# Patient Record
Sex: Male | Born: 1965
Health system: Southern US, Community
[De-identification: ages and names within clinical notes are randomized; demographics above are authoritative.]

## PROBLEM LIST (undated history)

## (undated) DIAGNOSIS — I5020 Unspecified systolic (congestive) heart failure: Secondary | ICD-10-CM

## (undated) DIAGNOSIS — E119 Type 2 diabetes mellitus without complications: Secondary | ICD-10-CM

## (undated) DIAGNOSIS — E559 Vitamin D deficiency, unspecified: Secondary | ICD-10-CM

## (undated) DIAGNOSIS — I4819 Other persistent atrial fibrillation: Secondary | ICD-10-CM

## (undated) DIAGNOSIS — N433 Hydrocele, unspecified: Secondary | ICD-10-CM

## (undated) DIAGNOSIS — K635 Polyp of colon: Secondary | ICD-10-CM

## (undated) DIAGNOSIS — I77810 Thoracic aortic ectasia: Secondary | ICD-10-CM

## (undated) DIAGNOSIS — M1990 Unspecified osteoarthritis, unspecified site: Secondary | ICD-10-CM

## (undated) DIAGNOSIS — K219 Gastro-esophageal reflux disease without esophagitis: Secondary | ICD-10-CM

## (undated) DIAGNOSIS — G4730 Sleep apnea, unspecified: Secondary | ICD-10-CM

## (undated) DIAGNOSIS — I429 Cardiomyopathy, unspecified: Secondary | ICD-10-CM

## (undated) DIAGNOSIS — T7840XA Allergy, unspecified, initial encounter: Secondary | ICD-10-CM

## (undated) HISTORY — DX: Allergy, unspecified, initial encounter: T78.40XA

## (undated) HISTORY — PX: APPENDECTOMY: SHX54

## (undated) HISTORY — PX: COLONOSCOPY WITH PROPOFOL: SHX5780

## (undated) HISTORY — DX: Sleep apnea, unspecified: G47.30

## (undated) HISTORY — DX: Unspecified osteoarthritis, unspecified site: M19.90

---

## 2005-03-24 ENCOUNTER — Observation Stay: Payer: Self-pay | Admitting: Internal Medicine

## 2007-08-17 ENCOUNTER — Emergency Department: Payer: Self-pay

## 2007-10-26 IMAGING — US ABDOMEN ULTRASOUND
1 series · 17 of 25 positions shown · non-contrast
Comparison: none

REASON FOR EXAM: Abdominal pain
COMMENTS:

[Series 1: abdomen ultrasound · 17 of 71 slices shown]
[im 1/71]
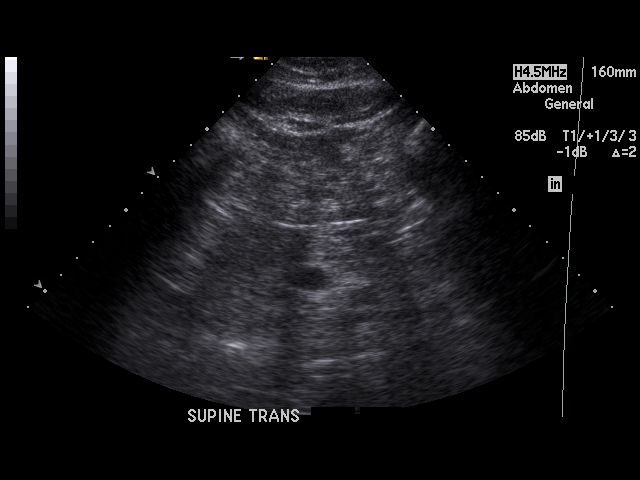
[im 6/71]
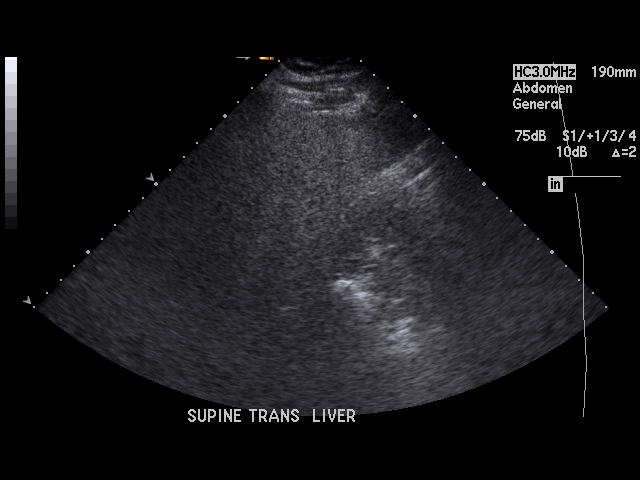
[im 9/71]
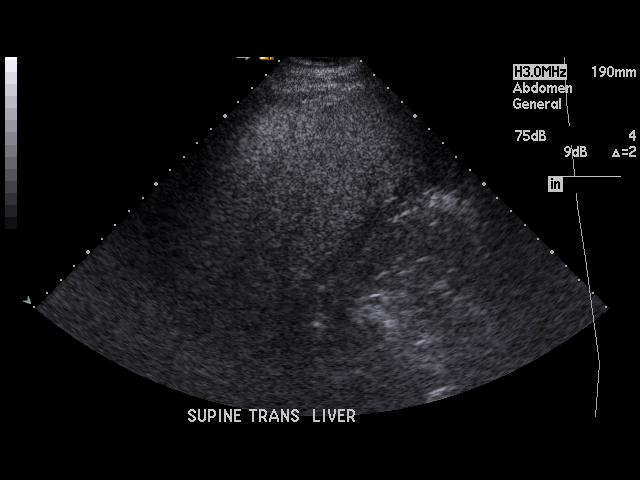
[im 15/71]
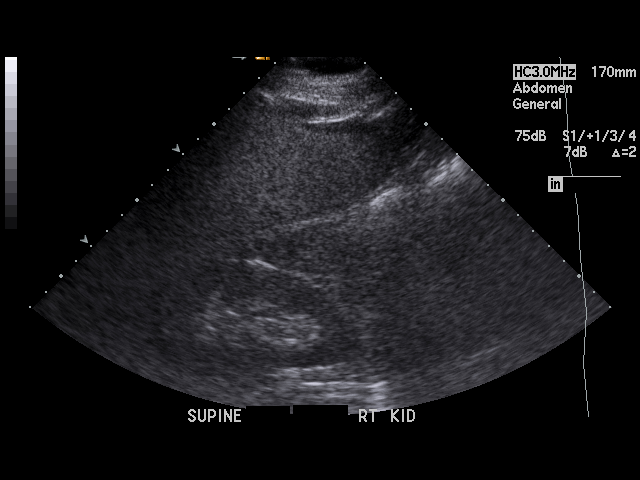
[im 18/71]
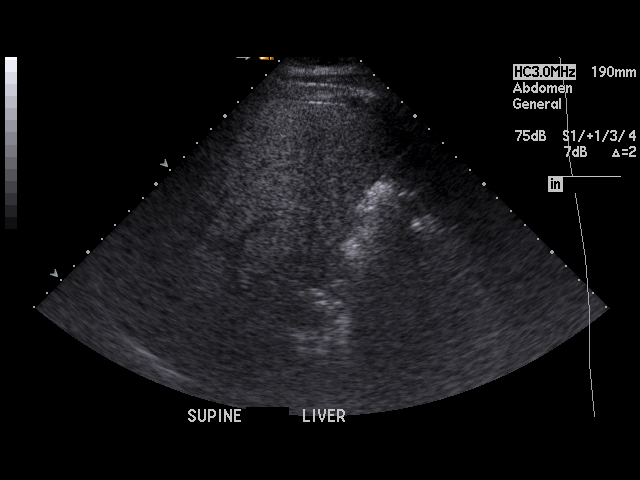
[im 24/71]
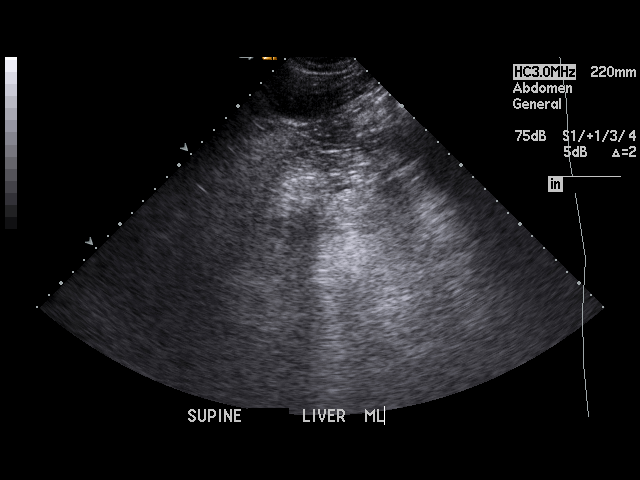
[im 27/71]
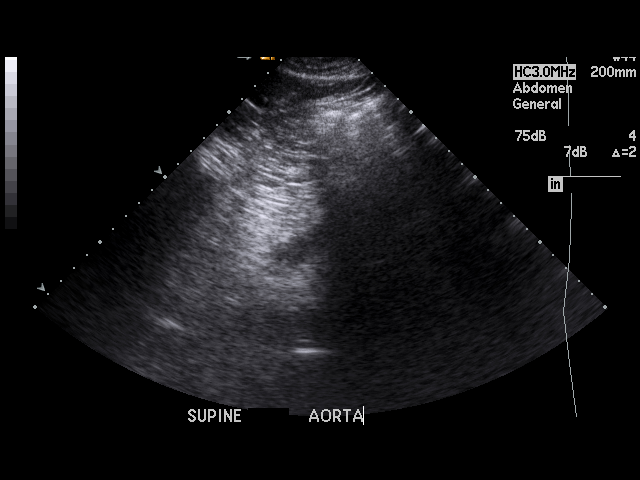
[im 33/71]
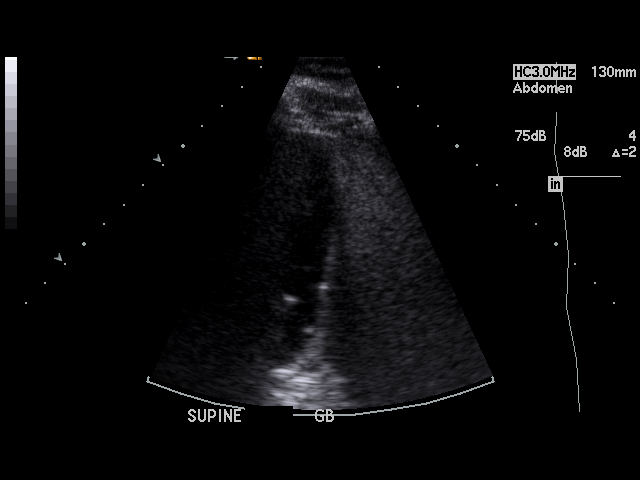
[im 36/71]
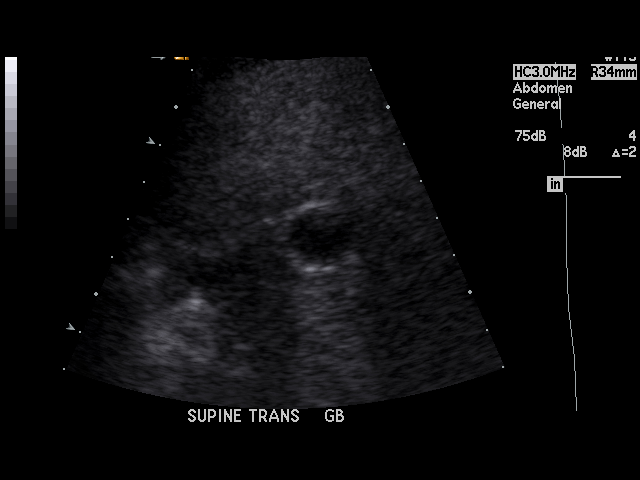
[im 38/71]
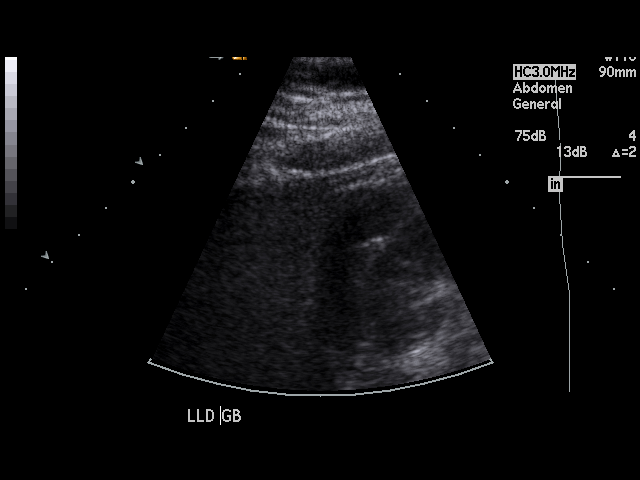
[im 44/71]
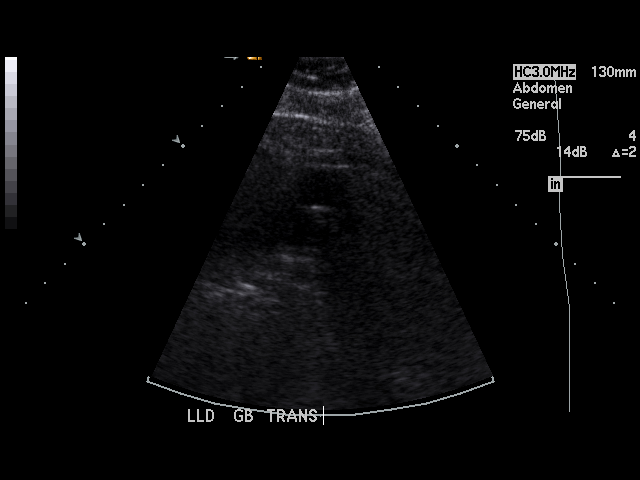
[im 47/71]
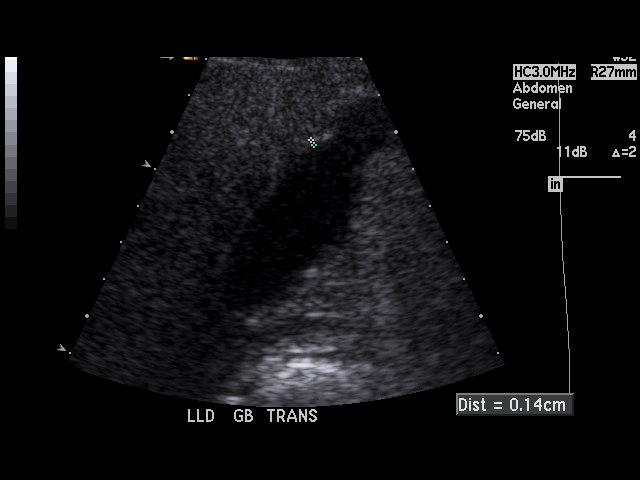
[im 53/71]
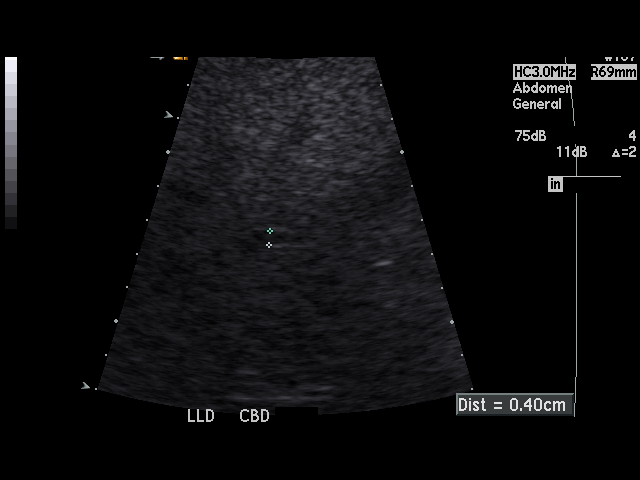
[im 56/71]
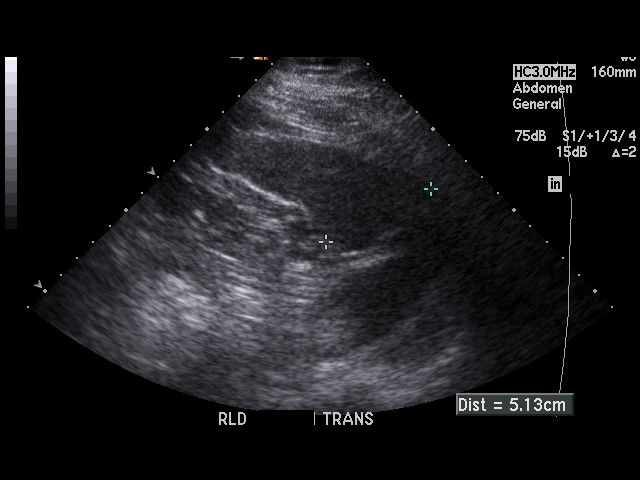
[im 62/71]
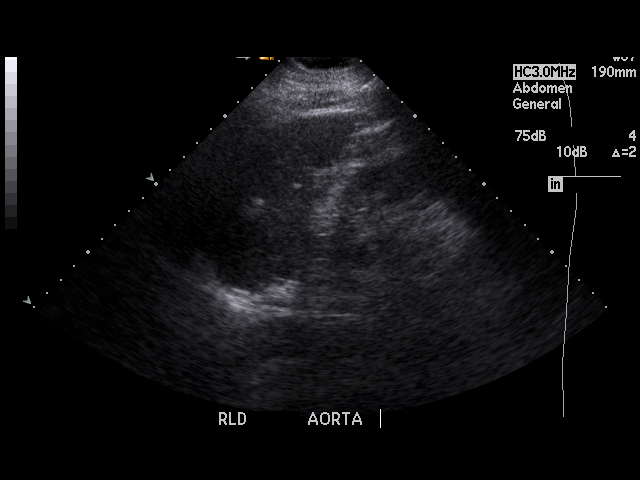
[im 65/71]
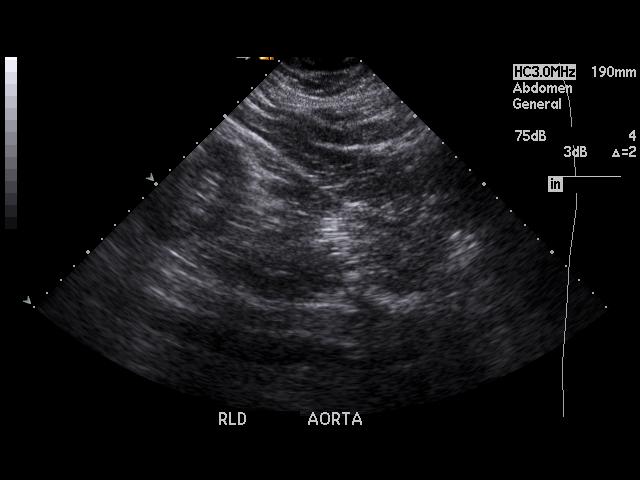
[im 71/71]
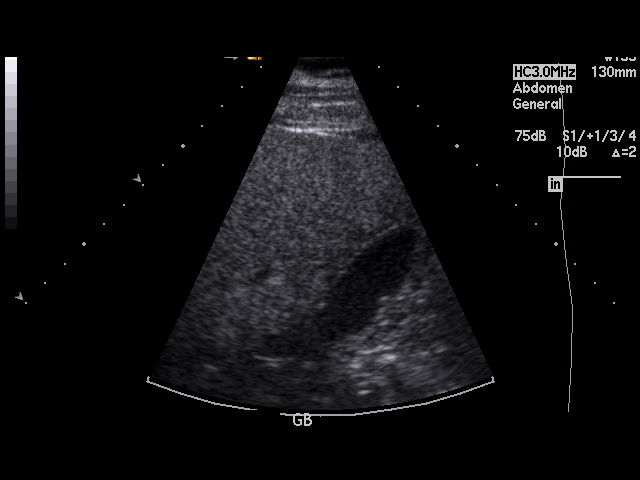

[17 of 25 positions shown; findings below may reference images not displayed]

PROCEDURE:     US  - US ABDOMEN GENERAL SURVEY  - March 24, 2005 [DATE]

RESULT:          Sonographic evaluation of the abdomen demonstrates a very
dense echogenic liver.  No gross abnormality is seen.  Portal venous flow is
normal.  The gallbladder contains some echogenic structures which do not
definitely shadow.  These do not change position with the patient being
placed from a supine to LEFT lateral decubitus to erect position.  There is
no sonographic Murphy's sign.  The gallbladder wall thickness is normal at 2
mm, and the common bile duct diameter is normal at 4 mm.  The spleen is
normal.  The kidneys are unremarkable.  The aorta appears to be grossly
normal.
IMPRESSION: 1.     Findings suggestive of gallbladder polyps versus adherent stones.  No
evidence of cholecystitis.  Continued clinical and laboratory followup would
be recommended.
2.     Dense liver which could represent fatty infiltration or chronic
hepatocellular disease.

## 2017-04-26 HISTORY — PX: COLONOSCOPY WITH PROPOFOL: SHX5780

## 2017-05-08 ENCOUNTER — Encounter: Payer: Self-pay | Admitting: Emergency Medicine

## 2017-05-08 ENCOUNTER — Emergency Department
Admission: EM | Admit: 2017-05-08 | Discharge: 2017-05-08 | Disposition: A | Discharge status: Home / Self Care | Payer: BLUE CROSS/BLUE SHIELD | Attending: Emergency Medicine | Admitting: Emergency Medicine

## 2017-05-08 ENCOUNTER — Emergency Department: Payer: BLUE CROSS/BLUE SHIELD

## 2017-05-08 ENCOUNTER — Other Ambulatory Visit: Payer: Self-pay

## 2017-05-08 DIAGNOSIS — R079 Chest pain, unspecified: Secondary | ICD-10-CM | POA: Insufficient documentation

## 2017-05-08 DIAGNOSIS — I10 Essential (primary) hypertension: Secondary | ICD-10-CM | POA: Insufficient documentation

## 2017-05-08 LAB — BASIC METABOLIC PANEL
Anion gap: 6 (ref 5–15)
BUN: 16 mg/dL (ref 6–20)
CALCIUM: 9.1 mg/dL (ref 8.9–10.3)
CO2: 28 mmol/L (ref 22–32)
CREATININE: 1.3 mg/dL — AB (ref 0.61–1.24)
Chloride: 104 mmol/L (ref 101–111)
Glucose, Bld: 106 mg/dL — ABNORMAL HIGH (ref 65–99)
Potassium: 4.2 mmol/L (ref 3.5–5.1)
SODIUM: 138 mmol/L (ref 135–145)

## 2017-05-08 LAB — TROPONIN I: Troponin I: <0.03 ng/mL (ref <0.03)

## 2017-05-08 LAB — CBC
HCT: 44.6 % (ref 40.0–52.0)
Hemoglobin: 15.3 g/dL (ref 13.0–18.0)
MCH: 30.6 pg (ref 26.0–34.0)
MCHC: 34.3 g/dL (ref 32.0–36.0)
MCV: 89.1 fL (ref 80.0–100.0)
Platelets: 313 10*3/uL (ref 150–440)
RBC: 5 MIL/uL (ref 4.40–5.90)
RDW: 13.4 % (ref 11.5–14.5)
WBC: 8.6 10*3/uL (ref 3.8–10.6)

## 2017-05-08 MED ORDER — FAMOTIDINE 20 MG PO TABS: 20.0000 mg | ORAL_TABLET | Freq: Two times a day (BID) | ORAL | 0 refills | Status: DC

## 2017-05-08 MED ORDER — GI COCKTAIL ~~LOC~~
30.0000 mL | Freq: Once | ORAL | Status: AC
Start: 2017-05-08 — End: 2017-05-08
  Administered 2017-05-08: 30 mL via ORAL

## 2017-05-08 NOTE — ED Provider Notes (Signed)
[facility] Emergency Department Provider Note   First MD Initiated Contact with Patient 05/08/17 [phone]     (approximate)  I have reviewed the triage vital signs and the nursing notes.   HISTORY  Chief Complaint Chest Pain    HPI Casey Velez is a 52 y.o. male with history of hypertension presents to the emergency department with acute onset 2:30 AM this morning of mid chest pain tightness that is nonradiating.  Patient states that he thought this may have been indigestion and as such he took an antacid as well as during 25 mg of aspirin without any relief.  Patient denies any dyspnea no lower extreme knee pain or swelling.  Patient denies any personal history of CAD DVT or pulmonary emboli.  Patient states that his father had a heart attack however no additional family history of CAD.   Past medical history Hypertension There are no active problems to display for this patient.   Past Surgical History:  Procedure Laterality Date  . APPENDECTOMY      Prior to Admission medications   Not on File    Allergies No known drug allergies No family history on file.  Social History Social History   Tobacco Use  . Smoking status: Never Smoker  . Smokeless tobacco: Never Used  Substance Use Topics  . Alcohol use: Not on file  . Drug use: Not on file    Review of Systems Constitutional: No fever/chills Eyes: No visual changes. ENT: No sore throat. Cardiovascular: Positive for chest pain. Respiratory: Denies shortness of breath. Gastrointestinal: No abdominal pain.  No nausea, no vomiting.  No diarrhea.  No constipation. Genitourinary: Negative for dysuria. Musculoskeletal: Negative for neck pain.  Negative for back pain. Integumentary: Negative for rash. Neurological: Negative for headaches, focal weakness or numbness.   ____________________________________________   PHYSICAL EXAM:  VITAL SIGNS: ED Triage Vitals  Enc Vitals Group   BP 05/08/17 0405 (!) 168/95     Pulse Rate 05/08/17 0405 65     Resp 05/08/17 0405 20     Temp 05/08/17 0405 97.9 F (36.6 C)     Temp Source 05/08/17 0405 Oral     SpO2 05/08/17 0405 97 %     Weight 05/08/17 0404 132.5 kg (292 lb)     Height 05/08/17 0404 1.676 m (5\' 6" )     Head Circumference --      Peak Flow --      Pain Score 05/08/17 0403 6     Pain Loc --      Pain Edu? --      Excl. in [place]? --      Constitutional: Alert and oriented. Well appearing and in no acute distress. Eyes: Conjunctivae are normal.  Head: Atraumatic. Mouth/Throat: Mucous membranes are moist.  Oropharynx non-erythematous. Neck: No stridor.   Cardiovascular: Normal rate, regular rhythm. Good peripheral circulation. Grossly normal heart sounds. Respiratory: Normal respiratory effort.  No retractions. Lungs CTAB. Gastrointestinal: Soft and nontender. No distention.  Musculoskeletal: No lower extremity tenderness nor edema. No gross deformities of extremities. Neurologic:  Normal speech and language. No gross focal neurologic deficits are appreciated.  Skin:  Skin is warm, dry and intact. No rash noted. Psychiatric: Mood and affect are normal. Speech and behavior are normal.  ____________________________________________   LABS (all labs ordered are listed, but only abnormal results are displayed)  Labs Reviewed  BASIC METABOLIC PANEL - Abnormal; Notable for the following components:  Result Value   Glucose, Bld 106 (*)    Creatinine, Ser 1.30 (*)    All other components within normal limits  CBC  TROPONIN I   ____________________________________________  EKG  ED ECG REPORT I, Garza N Ranon Coven, the attending physician, personally viewed and interpreted this ECG.   Date: 05/08/2017  EKG Time: 4:08 AM  Rate: 63  Rhythm: Normal sinus rhythm  Axis: Left axis deviation  Intervals: Normal  ST&T Change: None  ____________________________________________  RADIOLOGY I, [place] N  Verena Shawgo, personally viewed and evaluated these images (plain radiographs) as part of my medical decision making, as well as reviewing the written report by the radiologist.  ED MD interpretation: No active cardiopulmonary disease per radiologist.  Official radiology report(s): Dg Chest 2 View  Result Date: 05/08/2017 CLINICAL DATA:  Chest pain EXAM: CHEST - 2 VIEW COMPARISON:  None. FINDINGS: The heart size and mediastinal contours are within normal limits. Both lungs are clear. The visualized skeletal structures are unremarkable. IMPRESSION: No active cardiopulmonary disease. Electronically Signed   By: Ulyses Jarred M.D.   On: 05/08/2017 04:32     Procedures   ____________________________________________   INITIAL IMPRESSION / ASSESSMENT AND PLAN / ED COURSE  As part of my medical decision making, I reviewed the following data within the electronic MEDICAL RECORD NUMBER   52 year old male presented with above-stated history and physical exam nonradiating chest tightness.  Consider to possibly of CAD as such EKG was performed which revealed no evidence of ST segment elevation or depression.  Laboratory data revealed a normal troponin.  Plan to obtain a repeat troponin.  Consider possibility of PE and a low probability patient  And as such d-dimer will be obtained.  Patient given a GI cocktail in the emergency department with resolution of symptoms.  Patient's care transferred to Dr. Mable Paris pending repeat troponin with plan to discharge if negative ____________________________________________  FINAL CLINICAL IMPRESSION(S) / ED DIAGNOSES  Final diagnoses:  Chest pain, unspecified type     MEDICATIONS GIVEN DURING THIS VISIT:  Medications  gi cocktail (Maalox,Lidocaine,Donnatal) (30 mLs Oral Given 05/08/17 0510)     ED Discharge Orders    None       Note:  This document was prepared using Dragon voice recognition software and may include unintentional dictation errors.    Gregor Hams, MD 05/08/17 [phone]

## 2017-05-08 NOTE — Discharge Instructions (Signed)
It was a pleasure to take care of you today, and thank you for coming to our emergency department.  If you have any questions or concerns before leaving please ask the nurse to grab me and I'm more than happy to go through your aftercare instructions again.  If you were prescribed any opioid pain medication today such as Norco, Vicodin, Percocet, morphine, hydrocodone, or oxycodone please make sure you do not drive when you are taking this medication as it can alter your ability to drive safely.  If you have any concerns once you are home that you are not improving or are in fact getting worse before you can make it to your follow-up appointment, please do not hesitate to call [phone] and come back for further evaluation.  Darel Hong, MD  Results for orders placed or performed during the hospital encounter of 83/41/96  Basic metabolic panel  Result Value Ref Range   Sodium 138 135 - 145 mmol/L   Potassium 4.2 3.5 - 5.1 mmol/L   Chloride 104 101 - 111 mmol/L   CO2 28 22 - 32 mmol/L   Glucose, Bld 106 (H) 65 - 99 mg/dL   BUN 16 6 - 20 mg/dL   Creatinine, Ser 1.30 (H) 0.61 - 1.24 mg/dL   Calcium 9.1 8.9 - 10.3 mg/dL   GFR calc non Af Amer >60 >60 mL/min   GFR calc Af Amer >60 >60 mL/min   Anion gap 6 5 - 15  CBC  Result Value Ref Range   WBC 8.6 3.8 - 10.6 K/uL   RBC 5.00 4.40 - 5.90 MIL/uL   Hemoglobin 15.3 13.0 - 18.0 g/dL   HCT 44.6 40.0 - 52.0 %   MCV 89.1 80.0 - 100.0 fL   MCH 30.6 26.0 - 34.0 pg   MCHC 34.3 32.0 - 36.0 g/dL   RDW 13.4 11.5 - 14.5 %   Platelets 313 150 - 440 K/uL  Troponin I  Result Value Ref Range   Troponin I <0.03 <0.03 ng/mL  Troponin I  Result Value Ref Range   Troponin I <0.03 <0.03 ng/mL   Dg Chest 2 View  Result Date: 05/08/2017 CLINICAL DATA:  Chest pain EXAM: CHEST - 2 VIEW COMPARISON:  None. FINDINGS: The heart size and mediastinal contours are within normal limits. Both lungs are clear. The visualized skeletal structures are unremarkable.  IMPRESSION: No active cardiopulmonary disease. Electronically Signed   By: Ulyses Jarred M.D.   On: 05/08/2017 04:32

## 2017-05-08 NOTE — ED Provider Notes (Signed)
Care signed over from Dr. Owens Shark pending results second troponin.  Troponin is negative.  Patient symptoms are improved.  Will discharge home with primary care follow-up.  Strict return precautions have been given and patient verbalizes understanding and agree with plan.   Darel Hong, MD 05/08/17 [phone]

## 2017-05-08 NOTE — ED Triage Notes (Signed)
Patient ambulatory to triage with steady gait, without difficulty or distress noted; pt reports awoke at 230am with midsternal CP, nonradiating ("tightness"); took antacid and 325mg  ASA PTA; denies any accomp  symptoms;denies hx of same

## 2019-07-09 ENCOUNTER — Encounter: Payer: Self-pay | Admitting: Emergency Medicine

## 2019-07-09 ENCOUNTER — Emergency Department: Payer: PRIVATE HEALTH INSURANCE

## 2019-07-09 ENCOUNTER — Other Ambulatory Visit: Payer: Self-pay

## 2019-07-09 ENCOUNTER — Emergency Department
Admission: EM | Admit: 2019-07-09 | Discharge: 2019-07-09 | Disposition: A | Discharge status: Home / Self Care | Payer: PRIVATE HEALTH INSURANCE | Attending: Emergency Medicine | Admitting: Emergency Medicine

## 2019-07-09 DIAGNOSIS — S8002XA Contusion of left knee, initial encounter: Secondary | ICD-10-CM | POA: Diagnosis not present

## 2019-07-09 DIAGNOSIS — J40 Bronchitis, not specified as acute or chronic: Secondary | ICD-10-CM | POA: Insufficient documentation

## 2019-07-09 DIAGNOSIS — Z20822 Contact with and (suspected) exposure to covid-19: Secondary | ICD-10-CM | POA: Diagnosis not present

## 2019-07-09 DIAGNOSIS — S8992XA Unspecified injury of left lower leg, initial encounter: Secondary | ICD-10-CM | POA: Diagnosis not present

## 2019-07-09 DIAGNOSIS — R05 Cough: Secondary | ICD-10-CM | POA: Diagnosis not present

## 2019-07-09 DIAGNOSIS — F17210 Nicotine dependence, cigarettes, uncomplicated: Secondary | ICD-10-CM | POA: Insufficient documentation

## 2019-07-09 DIAGNOSIS — M25562 Pain in left knee: Secondary | ICD-10-CM | POA: Diagnosis not present

## 2019-07-09 MED ORDER — MELOXICAM 15 MG PO TABS: 15.0000 mg | ORAL_TABLET | Freq: Every day | ORAL | 0 refills | Status: DC

## 2019-07-09 MED ORDER — MELOXICAM 7.5 MG PO TABS
15.0000 mg | ORAL_TABLET | Freq: Once | ORAL | Status: AC
  Administered 2019-07-09: 15 mg via ORAL
  Filled 2019-07-09: qty 2

## 2019-07-09 MED ORDER — CYCLOBENZAPRINE HCL 5 MG PO TABS: 5.0000 mg | ORAL_TABLET | Freq: Three times a day (TID) | ORAL | 0 refills | Status: DC | PRN

## 2019-07-09 MED ORDER — CYCLOBENZAPRINE HCL 10 MG PO TABS
10.0000 mg | ORAL_TABLET | Freq: Once | ORAL | Status: AC
  Administered 2019-07-09: 10 mg via ORAL
  Filled 2019-07-09: qty 1
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
## 2019-07-09 NOTE — Discharge Instructions (Signed)
Please rest ice and elevate the left knee.  Take meloxicam daily.  You may take Tylenol for additional pain relief.  Use muscle relaxer as needed for muscle tightness in the neck and back.  Return to the ER for any worsening symptoms or urgent changes in your health.
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
## 2020-04-15 ENCOUNTER — Encounter: Payer: Self-pay | Admitting: Adult Health

## 2020-04-15 ENCOUNTER — Other Ambulatory Visit: Payer: Self-pay

## 2020-04-15 ENCOUNTER — Ambulatory Visit: Payer: BC Managed Care – PPO | Admitting: Adult Health

## 2020-04-15 VITALS — BP 152/86 | HR 72 | Temp 98.1°F | Resp 16 | Ht 67.0 in | Wt 337.0 lb

## 2020-04-15 DIAGNOSIS — Z125 Encounter for screening for malignant neoplasm of prostate: Secondary | ICD-10-CM

## 2020-04-15 DIAGNOSIS — R030 Elevated blood-pressure reading, without diagnosis of hypertension: Secondary | ICD-10-CM | POA: Insufficient documentation

## 2020-04-15 DIAGNOSIS — R5383 Other fatigue: Secondary | ICD-10-CM

## 2020-04-15 DIAGNOSIS — E559 Vitamin D deficiency, unspecified: Secondary | ICD-10-CM

## 2020-04-15 DIAGNOSIS — Z1389 Encounter for screening for other disorder: Secondary | ICD-10-CM | POA: Diagnosis not present

## 2020-04-15 DIAGNOSIS — Z6841 Body Mass Index (BMI) 40.0 and over, adult: Secondary | ICD-10-CM | POA: Diagnosis not present

## 2020-04-15 HISTORY — DX: Encounter for screening for malignant neoplasm of prostate: Z12.5

## 2020-04-15 HISTORY — DX: Other fatigue: R53.83

## 2020-04-15 LAB — POCT URINALYSIS DIPSTICK
Bilirubin, UA: NEGATIVE
Blood, UA: NEGATIVE
Glucose, UA: NEGATIVE
Ketones, UA: NEGATIVE
Leukocytes, UA: NEGATIVE
Nitrite, UA: NEGATIVE
Protein, UA: NEGATIVE
Spec Grav, UA: 1.01 (ref 1.010–1.025)
Urobilinogen, UA: 0.2 E.U./dL
pH, UA: 6.5 (ref 5.0–8.0)

## 2020-04-15 NOTE — Patient Instructions (Addendum)
Health Maintenance, Male Adopting a healthy lifestyle and getting preventive care are important in promoting health and wellness. Ask your health care provider about:  The right schedule for you to have regular tests and exams.  Things you can do on your own to prevent diseases and keep yourself healthy. What should I know about diet, weight, and exercise? Eat a healthy diet  Eat a diet that includes plenty of vegetables, fruits, low-fat dairy products, and lean protein.  Do not eat a lot of foods that are high in solid fats, added sugars, or sodium.   Maintain a healthy weight Body mass index (BMI) is a measurement that can be used to identify possible weight problems. It estimates body fat based on height and weight. Your health care provider can help determine your BMI and help you achieve or maintain a healthy weight. Get regular exercise Get regular exercise. This is one of the most important things you can do for your health. Most adults should:  Exercise for at least 150 minutes each week. The exercise should increase your heart rate and make you sweat (moderate-intensity exercise).  Do strengthening exercises at least twice a week. This is in addition to the moderate-intensity exercise.  Spend less time sitting. Even light physical activity can be beneficial. Watch cholesterol and blood lipids Have your blood tested for lipids and cholesterol at 54 years of age, then have this test every 5 years. You may need to have your cholesterol levels checked more often if:  Your lipid or cholesterol levels are high.  You are older than 54 years of age.  You are at high risk for heart disease. What should I know about cancer screening? Many types of cancers can be detected early and may often be prevented. Depending on your health history and family history, you may need to have cancer screening at various ages. This may include screening for:  Colorectal cancer.  Prostate  cancer.  Skin cancer.  Lung cancer. What should I know about heart disease, diabetes, and high blood pressure? Blood pressure and heart disease  High blood pressure causes heart disease and increases the risk of stroke. This is more likely to develop in people who have high blood pressure readings, are of African descent, or are overweight.  Talk with your health care provider about your target blood pressure readings.  Have your blood pressure checked: ? Every 3-5 years if you are 18-39 years of age. ? Every year if you are 40 years old or older.  If you are between the ages of 65 and 75 and are a current or former smoker, ask your health care provider if you should have a one-time screening for abdominal aortic aneurysm (AAA). Diabetes Have regular diabetes screenings. This checks your fasting blood sugar level. Have the screening done:  Once every three years after age 45 if you are at a normal weight and have a low risk for diabetes.  More often and at a younger age if you are overweight or have a high risk for diabetes. What should I know about preventing infection? Hepatitis B If you have a higher risk for hepatitis B, you should be screened for this virus. Talk with your health care provider to find out if you are at risk for hepatitis B infection. Hepatitis C Blood testing is recommended for:  Everyone born from 1945 through 1965.  Anyone with known risk factors for hepatitis C. Sexually transmitted infections (STIs)  You should be screened each   year for STIs, including gonorrhea and chlamydia, if: ? You are sexually active and are younger than 54 years of age. ? You are older than 54 years of age and your health care provider tells you that you are at risk for this type of infection. ? Your sexual activity has changed since you were last screened, and you are at increased risk for chlamydia or gonorrhea. Ask your health care provider if you are at risk.  Ask your  health care provider about whether you are at high risk for HIV. Your health care provider may recommend a prescription medicine to help prevent HIV infection. If you choose to take medicine to prevent HIV, you should first get tested for HIV. You should then be tested every 3 months for as long as you are taking the medicine. Follow these instructions at home: Lifestyle  Do not use any products that contain nicotine or tobacco, such as cigarettes, e-cigarettes, and chewing tobacco. If you need help quitting, ask your health care provider.  Do not use street drugs.  Do not share needles.  Ask your health care provider for help if you need support or information about quitting drugs. Alcohol use  Do not drink alcohol if your health care provider tells you not to drink.  If you drink alcohol: ? Limit how much you have to 0-2 drinks a day. ? Be aware of how much alcohol is in your drink. In the [place] one drink equals one 12 oz bottle of beer (355 mL), one 5 oz glass of wine (148 mL), or one 1 oz glass of hard liquor (44 mL). General instructions  Schedule regular health, dental, and eye exams.  Stay current with your vaccines.  Tell your health care provider if: ? You often feel depressed. ? You have ever been abused or do not feel safe at home. Summary  Adopting a healthy lifestyle and getting preventive care are important in promoting health and wellness.  Follow your health care provider's instructions about healthy diet, exercising, and getting tested or screened for diseases.  Follow your health care provider's instructions on monitoring your cholesterol and blood pressure. This information is not intended to replace advice given to you by your health care provider. Make sure you discuss any questions you have with your health care provider. Document Revised: 01/15/2018 Document Reviewed: 01/15/2018 Elsevier Patient Education  2021 [place] for [employer]  Loss Calories are units of energy. Your body needs a certain number of calories from food to keep going throughout the day. When you eat or drink more calories than your body needs, your body stores the extra calories mostly as fat. When you eat or drink fewer calories than your body needs, your body burns fat to get the energy it needs. Calorie counting means keeping track of how many calories you eat and drink each day. Calorie counting can be helpful if you need to lose weight. If you eat fewer calories than your body needs, you should lose weight. Ask your health care provider what a healthy weight is for you. For calorie counting to work, you will need to eat the right number of calories each day to lose a healthy amount of weight per week. A dietitian can help you figure out how many calories you need in a day and will suggest ways to reach your calorie goal.  A healthy amount of weight to lose each week is usually 1-2 lb (0.5-0.9 kg). This usually means  that your daily calorie intake should be reduced by 500-750 calories.  Eating 1,200-1,500 calories a day can help most women lose weight.  Eating 1,500-1,800 calories a day can help most men lose weight. What do I need to know about calorie counting? Work with your health care provider or dietitian to determine how many calories you should get each day. To meet your daily calorie goal, you will need to:  Find out how many calories are in each food that you would like to eat. Try to do this before you eat.  Decide how much of the food you plan to eat.  Keep a food log. Do this by writing down what you ate and how many calories it had. To successfully lose weight, it is important to balance calorie counting with a healthy lifestyle that includes regular activity. Where do I find calorie information? The number of calories in a food can be found on a Nutrition Facts label. If a food does not have a Nutrition Facts label, try to look up the  calories online or ask your dietitian for help. Remember that calories are listed per serving. If you choose to have more than one serving of a food, you will have to multiply the calories per serving by the number of servings you plan to eat. For example, the label on a package of bread might say that a serving size is 1 slice and that there are 90 calories in a serving. If you eat 1 slice, you will have eaten 90 calories. If you eat 2 slices, you will have eaten 180 calories.   How do I keep a food log? After each time that you eat, record the following in your food log as soon as possible:  What you ate. Be sure to include toppings, sauces, and other extras on the food.  How much you ate. This can be measured in cups, ounces, or number of items.  How many calories were in each food and drink.  The total number of calories in the food you ate. Keep your food log near you, such as in a pocket-sized notebook or on an app or website on your mobile phone. Some programs will calculate calories for you and show you how many calories you have left to meet your daily goal. What are some portion-control tips?  Know how many calories are in a serving. This will help you know how many servings you can have of a certain food.  Use a measuring cup to measure serving sizes. You could also try weighing out portions on a kitchen scale. With time, you will be able to estimate serving sizes for some foods.  Take time to put servings of different foods on your favorite plates or in your favorite bowls and cups so you know what a serving looks like.  Try not to eat straight from a food's packaging, such as from a bag or box. Eating straight from the package makes it hard to see how much you are eating and can lead to overeating. Put the amount you would like to eat in a cup or on a plate to make sure you are eating the right portion.  Use smaller plates, glasses, and bowls for smaller portions and to prevent  overeating.  Try not to multitask. For example, avoid watching TV or using your computer while eating. If it is time to eat, sit down at a table and enjoy your food. This will help you recognize when  you are full. It will also help you be more mindful of what and how much you are eating. What are tips for following this plan? Reading food labels  Check the calorie count compared with the serving size. The serving size may be smaller than what you are used to eating.  Check the source of the calories. Try to choose foods that are high in protein, fiber, and vitamins, and low in saturated fat, trans fat, and sodium. Shopping  Read nutrition labels while you shop. This will help you make healthy decisions about which foods to buy.  Pay attention to nutrition labels for low-fat or fat-free foods. These foods sometimes have the same number of calories or more calories than the full-fat versions. They also often have added sugar, starch, or salt to make up for flavor that was removed with the fat.  Make a grocery list of lower-calorie foods and stick to it. Cooking  Try to cook your favorite foods in a healthier way. For example, try baking instead of frying.  Use low-fat dairy products. Meal planning  Use more fruits and vegetables. One-half of your plate should be fruits and vegetables.  Include lean proteins, such as chicken, [place], and fish. Lifestyle Each week, aim to do one of the following:  150 minutes of moderate exercise, such as walking.  75 minutes of vigorous exercise, such as running. General information  Know how many calories are in the foods you eat most often. This will help you calculate calorie counts faster.  Find a way of tracking calories that works for you. Get creative. Try different apps or programs if writing down calories does not work for you. What foods should I eat?  Eat nutritious foods. It is better to have a nutritious, high-calorie food, such as an  avocado, than a food with few nutrients, such as a bag of potato chips.  Use your calories on foods and drinks that will fill you up and will not leave you hungry soon after eating. ? Examples of foods that fill you up are nuts and nut butters, vegetables, lean proteins, and high-fiber foods such as whole grains. High-fiber foods are foods with more than 5 g of fiber per serving.  Pay attention to calories in drinks. Low-calorie drinks include water and unsweetened drinks. The items listed above may not be a complete list of foods and beverages you can eat. Contact a dietitian for more information.   What foods should I limit? Limit foods or drinks that are not good sources of vitamins, minerals, or protein or that are high in unhealthy fats. These include:  Candy.  Other sweets.  Sodas, specialty coffee drinks, alcohol, and juice. The items listed above may not be a complete list of foods and beverages you should avoid. Contact a dietitian for more information. How do I count calories when eating out?  Pay attention to portions. Often, portions are much larger when eating out. Try these tips to keep portions smaller: ? Consider sharing a meal instead of getting your own. ? If you get your own meal, eat only half of it. Before you start eating, ask for a container and put half of your meal into it. ? When available, consider ordering smaller portions from the menu instead of full portions.  Pay attention to your food and drink choices. Knowing the way food is cooked and what is included with the meal can help you eat fewer calories. ? If calories are listed on the  menu, choose the lower-calorie options. ? Choose dishes that include vegetables, fruits, whole grains, low-fat dairy products, and lean proteins. ? Choose items that are boiled, broiled, grilled, or steamed. Avoid items that are buttered, battered, fried, or served with cream sauce. Items labeled as crispy are usually fried,  unless stated otherwise. ? Choose water, low-fat milk, unsweetened iced tea, or other drinks without added sugar. If you want an alcoholic beverage, choose a lower-calorie option, such as a glass of wine or light beer. ? Ask for dressings, sauces, and syrups on the side. These are usually high in calories, so you should limit the amount you eat. ? If you want a salad, choose a garden salad and ask for grilled meats. Avoid extra toppings such as bacon, cheese, or fried items. Ask for the dressing on the side, or ask for olive oil and vinegar or lemon to use as dressing.  Estimate how many servings of a food you are given. Knowing serving sizes will help you be aware of how much food you are eating at restaurants. Where to find more information  Centers for Disease Control and Prevention: http://www.wolf.info/  [place] Department of Agriculture: http://www.wilson-mendoza.org/ Summary  Calorie counting means keeping track of how many calories you eat and drink each day. If you eat fewer calories than your body needs, you should lose weight.  A healthy amount of weight to lose per week is usually 1-2 lb (0.5-0.9 kg). This usually means reducing your daily calorie intake by 500-750 calories.  The number of calories in a food can be found on a Nutrition Facts label. If a food does not have a Nutrition Facts label, try to look up the calories online or ask your dietitian for help.  Use smaller plates, glasses, and bowls for smaller portions and to prevent overeating.  Use your calories on foods and drinks that will fill you up and not leave you hungry shortly after a meal. This information is not intended to replace advice given to you by your health care provider. Make sure you discuss any questions you have with your health care provider. Document Revised: 03/05/2019 Document Reviewed: 03/05/2019 Elsevier Patient Education  2021 [place] and Cholesterol Restricted Eating Plan Getting too much fat and cholesterol  in your diet may cause health problems. Choosing the right foods helps keep your fat and cholesterol at normal levels. This can keep you from getting certain diseases. Your doctor may recommend an eating plan that includes:  Total fat: ______% or less of total calories a day.  Saturated fat: ______% or less of total calories a day.  Cholesterol: less than _________mg a day.  Fiber: ______g a day. What are tips for following this plan? Meal planning  At meals, divide your plate into four equal parts: ? Fill one-half of your plate with vegetables and green salads. ? Fill one-fourth of your plate with whole grains. ? Fill one-fourth of your plate with low-fat (lean) protein foods.  Eat fish that is high in omega-3 fats at least two times a week. This includes mackerel, tuna, sardines, and salmon.  Eat foods that are high in fiber, such as whole grains, beans, apples, broccoli, carrots, peas, and barley. General tips  Work with your doctor to lose weight if you need to.  Avoid: ? Foods with added sugar. ? Fried foods. ? Foods with partially hydrogenated oils.  Limit alcohol intake to no more than 1 drink a day for nonpregnant women and 2  drinks a day for men. One drink equals 12 oz of beer, 5 oz of wine, or 1 oz of hard liquor.   Reading food labels  Check food labels for: ? Trans fats. ? Partially hydrogenated oils. ? Saturated fat (g) in each serving. ? Cholesterol (mg) in each serving. ? Fiber (g) in each serving.  Choose foods with healthy fats, such as: ? Monounsaturated fats. ? Polyunsaturated fats. ? Omega-3 fats.  Choose grain products that have whole grains. Look for the word "whole" as the first word in the ingredient list. Cooking  Cook foods using low-fat methods. These include baking, boiling, grilling, and broiling.  Eat more home-cooked foods. Eat at restaurants and buffets less often.  Avoid cooking using saturated fats, such as butter, cream, palm  oil, palm kernel oil, and coconut oil. Recommended foods Fruits  All fresh, canned (in natural juice), or frozen fruits. Vegetables  Fresh or frozen vegetables (raw, steamed, roasted, or grilled). Green salads. Grains  Whole grains, such as whole wheat or whole grain breads, crackers, cereals, and pasta. Unsweetened oatmeal, bulgur, barley, quinoa, or brown rice. Corn or whole wheat flour tortillas. Meats and other protein foods  Ground beef (85% or leaner), grass-fed beef, or beef trimmed of fat. Skinless chicken or [place]. Ground chicken or [place]. Pork trimmed of fat. All fish and seafood. Egg whites. Dried beans, peas, or lentils. Unsalted nuts or seeds. Unsalted canned beans. Nut butters without added sugar or oil. Dairy  Low-fat or nonfat dairy products, such as skim or 1% milk, 2% or reduced-fat cheeses, low-fat and fat-free ricotta or cottage cheese, or plain low-fat and nonfat yogurt. Fats and oils  Tub margarine without trans fats. Light or reduced-fat mayonnaise and salad dressings. Avocado. Olive, canola, sesame, or safflower oils. The items listed above may not be a complete list of foods and beverages you can eat. Contact a dietitian for more information.   Foods to avoid Fruits  Canned fruit in heavy syrup. Fruit in cream or butter sauce. Fried fruit. Vegetables  Vegetables cooked in cheese, cream, or butter sauce. Fried vegetables. Grains  White bread. White pasta. White rice. Cornbread. Bagels, pastries, and croissants. Crackers and snack foods that contain trans fat and hydrogenated oils. Meats and other protein foods  Fatty cuts of meat. Ribs, chicken wings, bacon, sausage, bologna, salami, chitterlings, fatback, hot dogs, bratwurst, and packaged lunch meats. Liver and organ meats. Whole eggs and egg yolks. Chicken and [place] with skin. Fried meat. Dairy  Whole or 2% milk, cream, half-and-half, and cream cheese. Whole milk cheeses. Whole-fat or sweetened yogurt.  Full-fat cheeses. Nondairy creamers and whipped toppings. Processed cheese, cheese spreads, and cheese curds. Beverages  Alcohol. Sugar-sweetened drinks such as sodas, lemonade, and fruit drinks. Fats and oils  Butter, stick margarine, lard, shortening, ghee, or bacon fat. Coconut, palm kernel, and palm oils. Sweets and desserts  Corn syrup, sugars, honey, and molasses. Candy. Jam and jelly. Syrup. Sweetened cereals. Cookies, pies, cakes, donuts, muffins, and ice cream. The items listed above may not be a complete list of foods and beverages you should avoid. Contact a dietitian for more information. Summary  Choosing the right foods helps keep your fat and cholesterol at normal levels. This can keep you from getting certain diseases.  At meals, fill one-half of your plate with vegetables and green salads.  Eat high-fiber foods, like whole grains, beans, apples, carrots, peas, and barley.  Limit added sugar, saturated fats, alcohol, and fried foods. This information is  not intended to replace advice given to you by your health care provider. Make sure you discuss any questions you have with your health care provider. Document Revised: 05/27/2019 Document Reviewed: 05/27/2019 Elsevier Patient Education  2021 [place] for [employer] Loss Calories are units of energy. Your body needs a certain number of calories from food to keep going throughout the day. When you eat or drink more calories than your body needs, your body stores the extra calories mostly as fat. When you eat or drink fewer calories than your body needs, your body burns fat to get the energy it needs. Calorie counting means keeping track of how many calories you eat and drink each day. Calorie counting can be helpful if you need to lose weight. If you eat fewer calories than your body needs, you should lose weight. Ask your health care provider what a healthy weight is for you. For calorie counting to  work, you will need to eat the right number of calories each day to lose a healthy amount of weight per week. A dietitian can help you figure out how many calories you need in a day and will suggest ways to reach your calorie goal.  A healthy amount of weight to lose each week is usually 1-2 lb (0.5-0.9 kg). This usually means that your daily calorie intake should be reduced by 500-750 calories.  Eating 1,200-1,500 calories a day can help most women lose weight.  Eating 1,500-1,800 calories a day can help most men lose weight. What do I need to know about calorie counting? Work with your health care provider or dietitian to determine how many calories you should get each day. To meet your daily calorie goal, you will need to:  Find out how many calories are in each food that you would like to eat. Try to do this before you eat.  Decide how much of the food you plan to eat.  Keep a food log. Do this by writing down what you ate and how many calories it had. To successfully lose weight, it is important to balance calorie counting with a healthy lifestyle that includes regular activity. Where do I find calorie information? The number of calories in a food can be found on a Nutrition Facts label. If a food does not have a Nutrition Facts label, try to look up the calories online or ask your dietitian for help. Remember that calories are listed per serving. If you choose to have more than one serving of a food, you will have to multiply the calories per serving by the number of servings you plan to eat. For example, the label on a package of bread might say that a serving size is 1 slice and that there are 90 calories in a serving. If you eat 1 slice, you will have eaten 90 calories. If you eat 2 slices, you will have eaten 180 calories.   How do I keep a food log? After each time that you eat, record the following in your food log as soon as possible:  What you ate. Be sure to include toppings,  sauces, and other extras on the food.  How much you ate. This can be measured in cups, ounces, or number of items.  How many calories were in each food and drink.  The total number of calories in the food you ate. Keep your food log near you, such as in a pocket-sized notebook or on an app or  website on your mobile phone. Some programs will calculate calories for you and show you how many calories you have left to meet your daily goal. What are some portion-control tips?  Know how many calories are in a serving. This will help you know how many servings you can have of a certain food.  Use a measuring cup to measure serving sizes. You could also try weighing out portions on a kitchen scale. With time, you will be able to estimate serving sizes for some foods.  Take time to put servings of different foods on your favorite plates or in your favorite bowls and cups so you know what a serving looks like.  Try not to eat straight from a food's packaging, such as from a bag or box. Eating straight from the package makes it hard to see how much you are eating and can lead to overeating. Put the amount you would like to eat in a cup or on a plate to make sure you are eating the right portion.  Use smaller plates, glasses, and bowls for smaller portions and to prevent overeating.  Try not to multitask. For example, avoid watching TV or using your computer while eating. If it is time to eat, sit down at a table and enjoy your food. This will help you recognize when you are full. It will also help you be more mindful of what and how much you are eating. What are tips for following this plan? Reading food labels  Check the calorie count compared with the serving size. The serving size may be smaller than what you are used to eating.  Check the source of the calories. Try to choose foods that are high in protein, fiber, and vitamins, and low in saturated fat, trans fat, and sodium. Shopping  Read  nutrition labels while you shop. This will help you make healthy decisions about which foods to buy.  Pay attention to nutrition labels for low-fat or fat-free foods. These foods sometimes have the same number of calories or more calories than the full-fat versions. They also often have added sugar, starch, or salt to make up for flavor that was removed with the fat.  Make a grocery list of lower-calorie foods and stick to it. Cooking  Try to cook your favorite foods in a healthier way. For example, try baking instead of frying.  Use low-fat dairy products. Meal planning  Use more fruits and vegetables. One-half of your plate should be fruits and vegetables.  Include lean proteins, such as chicken, [place], and fish. Lifestyle Each week, aim to do one of the following:  150 minutes of moderate exercise, such as walking.  75 minutes of vigorous exercise, such as running. General information  Know how many calories are in the foods you eat most often. This will help you calculate calorie counts faster.  Find a way of tracking calories that works for you. Get creative. Try different apps or programs if writing down calories does not work for you. What foods should I eat?  Eat nutritious foods. It is better to have a nutritious, high-calorie food, such as an avocado, than a food with few nutrients, such as a bag of potato chips.  Use your calories on foods and drinks that will fill you up and will not leave you hungry soon after eating. ? Examples of foods that fill you up are nuts and nut butters, vegetables, lean proteins, and high-fiber foods such as whole grains. High-fiber foods are  foods with more than 5 g of fiber per serving.  Pay attention to calories in drinks. Low-calorie drinks include water and unsweetened drinks. The items listed above may not be a complete list of foods and beverages you can eat. Contact a dietitian for more information.   What foods should I  limit? Limit foods or drinks that are not good sources of vitamins, minerals, or protein or that are high in unhealthy fats. These include:  Candy.  Other sweets.  Sodas, specialty coffee drinks, alcohol, and juice. The items listed above may not be a complete list of foods and beverages you should avoid. Contact a dietitian for more information. How do I count calories when eating out?  Pay attention to portions. Often, portions are much larger when eating out. Try these tips to keep portions smaller: ? Consider sharing a meal instead of getting your own. ? If you get your own meal, eat only half of it. Before you start eating, ask for a container and put half of your meal into it. ? When available, consider ordering smaller portions from the menu instead of full portions.  Pay attention to your food and drink choices. Knowing the way food is cooked and what is included with the meal can help you eat fewer calories. ? If calories are listed on the menu, choose the lower-calorie options. ? Choose dishes that include vegetables, fruits, whole grains, low-fat dairy products, and lean proteins. ? Choose items that are boiled, broiled, grilled, or steamed. Avoid items that are buttered, battered, fried, or served with cream sauce. Items labeled as crispy are usually fried, unless stated otherwise. ? Choose water, low-fat milk, unsweetened iced tea, or other drinks without added sugar. If you want an alcoholic beverage, choose a lower-calorie option, such as a glass of wine or light beer. ? Ask for dressings, sauces, and syrups on the side. These are usually high in calories, so you should limit the amount you eat. ? If you want a salad, choose a garden salad and ask for grilled meats. Avoid extra toppings such as bacon, cheese, or fried items. Ask for the dressing on the side, or ask for olive oil and vinegar or lemon to use as dressing.  Estimate how many servings of a food you are given.  Knowing serving sizes will help you be aware of how much food you are eating at restaurants. Where to find more information  Centers for Disease Control and Prevention: http://www.wolf.info/  U.[place]. Department of Agriculture: http://www.wilson-mendoza.org/ Summary  Calorie counting means keeping track of how many calories you eat and drink each day. If you eat fewer calories than your body needs, you should lose weight.  A healthy amount of weight to lose per week is usually 1-2 lb (0.5-0.9 kg). This usually means reducing your daily calorie intake by 500-750 calories.  The number of calories in a food can be found on a Nutrition Facts label. If a food does not have a Nutrition Facts label, try to look up the calories online or ask your dietitian for help.  Use smaller plates, glasses, and bowls for smaller portions and to prevent overeating.  Use your calories on foods and drinks that will fill you up and not leave you hungry shortly after a meal. This information is not intended to replace advice given to you by your health care provider. Make sure you discuss any questions you have with your health care provider. Document Revised: 03/05/2019 Document Reviewed: 03/05/2019 Elsevier Patient  Education  2021 [place] and Cholesterol Restricted Eating Plan Getting too much fat and cholesterol in your diet may cause health problems. Choosing the right foods helps keep your fat and cholesterol at normal levels. This can keep you from getting certain diseases. Your doctor may recommend an eating plan that includes:  Total fat: ______% or less of total calories a day.  Saturated fat: ______% or less of total calories a day.  Cholesterol: less than _________mg a day.  Fiber: ______g a day. What are tips for following this plan? Meal planning  At meals, divide your plate into four equal parts: ? Fill one-half of your plate with vegetables and green salads. ? Fill one-fourth of your plate with whole  grains. ? Fill one-fourth of your plate with low-fat (lean) protein foods.  Eat fish that is high in omega-3 fats at least two times a week. This includes mackerel, tuna, sardines, and salmon.  Eat foods that are high in fiber, such as whole grains, beans, apples, broccoli, carrots, peas, and barley. General tips  Work with your doctor to lose weight if you need to.  Avoid: ? Foods with added sugar. ? Fried foods. ? Foods with partially hydrogenated oils.  Limit alcohol intake to no more than 1 drink a day for nonpregnant women and 2 drinks a day for men. One drink equals 12 oz of beer, 5 oz of wine, or 1 oz of hard liquor.   Reading food labels  Check food labels for: ? Trans fats. ? Partially hydrogenated oils. ? Saturated fat (g) in each serving. ? Cholesterol (mg) in each serving. ? Fiber (g) in each serving.  Choose foods with healthy fats, such as: ? Monounsaturated fats. ? Polyunsaturated fats. ? Omega-3 fats.  Choose grain products that have whole grains. Look for the word "whole" as the first word in the ingredient list. Cooking  Cook foods using low-fat methods. These include baking, boiling, grilling, and broiling.  Eat more home-cooked foods. Eat at restaurants and buffets less often.  Avoid cooking using saturated fats, such as butter, cream, palm oil, palm kernel oil, and coconut oil. Recommended foods Fruits  All fresh, canned (in natural juice), or frozen fruits. Vegetables  Fresh or frozen vegetables (raw, steamed, roasted, or grilled). Green salads. Grains  Whole grains, such as whole wheat or whole grain breads, crackers, cereals, and pasta. Unsweetened oatmeal, bulgur, barley, quinoa, or brown rice. Corn or whole wheat flour tortillas. Meats and other protein foods  Ground beef (85% or leaner), grass-fed beef, or beef trimmed of fat. Skinless chicken or [place]. Ground chicken or [place]. Pork trimmed of fat. All fish and seafood. Egg whites. Dried  beans, peas, or lentils. Unsalted nuts or seeds. Unsalted canned beans. Nut butters without added sugar or oil. Dairy  Low-fat or nonfat dairy products, such as skim or 1% milk, 2% or reduced-fat cheeses, low-fat and fat-free ricotta or cottage cheese, or plain low-fat and nonfat yogurt. Fats and oils  Tub margarine without trans fats. Light or reduced-fat mayonnaise and salad dressings. Avocado. Olive, canola, sesame, or safflower oils. The items listed above may not be a complete list of foods and beverages you can eat. Contact a dietitian for more information.   Foods to avoid Fruits  Canned fruit in heavy syrup. Fruit in cream or butter sauce. Fried fruit. Vegetables  Vegetables cooked in cheese, cream, or butter sauce. Fried vegetables. Grains  White bread. White pasta. White rice. Cornbread. Bagels, pastries,  and croissants. Crackers and snack foods that contain trans fat and hydrogenated oils. Meats and other protein foods  Fatty cuts of meat. Ribs, chicken wings, bacon, sausage, bologna, salami, chitterlings, fatback, hot dogs, bratwurst, and packaged lunch meats. Liver and organ meats. Whole eggs and egg yolks. Chicken and [place] with skin. Fried meat. Dairy  Whole or 2% milk, cream, half-and-half, and cream cheese. Whole milk cheeses. Whole-fat or sweetened yogurt. Full-fat cheeses. Nondairy creamers and whipped toppings. Processed cheese, cheese spreads, and cheese curds. Beverages  Alcohol. Sugar-sweetened drinks such as sodas, lemonade, and fruit drinks. Fats and oils  Butter, stick margarine, lard, shortening, ghee, or bacon fat. Coconut, palm kernel, and palm oils. Sweets and desserts  Corn syrup, sugars, honey, and molasses. Candy. Jam and jelly. Syrup. Sweetened cereals. Cookies, pies, cakes, donuts, muffins, and ice cream. The items listed above may not be a complete list of foods and beverages you should avoid. Contact a dietitian for more  information. Summary  Choosing the right foods helps keep your fat and cholesterol at normal levels. This can keep you from getting certain diseases.  At meals, fill one-half of your plate with vegetables and green salads.  Eat high-fiber foods, like whole grains, beans, apples, carrots, peas, and barley.  Limit added sugar, saturated fats, alcohol, and fried foods. This information is not intended to replace advice given to you by your health care provider. Make sure you discuss any questions you have with your health care provider. Document Revised: 05/27/2019 Document Reviewed: 05/27/2019 Elsevier Patient Education  2021 [employer].

## 2020-04-15 NOTE — Progress Notes (Signed)
New patient visit   [name]   DOB: 14-Jun-1965   55 y.o. Male  MRN: [id number] Visit Date: 04/15/2020  Today's healthcare provider: Marcille Buffy, FNP   Chief Complaint  Patient presents with  . New Patient (Initial Visit)   Subjective    Casey Velez is a 55 y.o. male who presents today as a new patient to establish care.  HPI  Patient presents in office today to establish care, he states that he feels well today and does have concerns to address. Patient states that he would like to discuss today about bariatric surgery and how to work on a healthy lifestyle. Patient reports that in general he follows a well balanced diet of 2-3 meals a day and snack, patient reports that he is not actively exercising due to his job and arthritic pain in both his knees. Patient reports that sleep habits have been improving and states that he sleeps with cpap machine.    He has tried multiple diets with some weight loss but then ends up gaining it back . He reports with exercise his lowest weight was 190 lbs. This was years ago.   Denies any rectal bleeding.   He has colonoscopy 2019. [facility]. I do not see a return date patient will bring in.   Patient  denies any fever, body aches,chills, rash, chest pain, shortness of breath, nausea, vomiting, or diarrhea.  Denies dizziness, lightheadedness, pre syncopal or syncopal episodes.    Father with CHF thinks he died of stroke.   Past Medical History:  Diagnosis Date  . Allergy   . Arthritis   . Sleep apnea    Past Surgical History:  Procedure Laterality Date  . APPENDECTOMY     Family Status  Relation Name Status  . Father  (Not Specified)  . PGF  (Not Specified)   Family History  Problem Relation Age of Onset  . Diabetes Father   . Glaucoma Father   . Cancer Paternal Grandfather    Social History   Socioeconomic History  . Marital status: Single    Spouse name: Not on file  . Number of children: Not  on file  . Years of education: Not on file  . Highest education level: Not on file  Occupational History  . Not on file  Tobacco Use  . Smoking status: Never Smoker  . Smokeless tobacco: Never Used  Vaping Use  . Vaping Use: Never used  Substance and Sexual Activity  . Alcohol use: Not on file  . Drug use: Not on file  . Sexual activity: Not on file  Other Topics Concern  . Not on file  Social History Narrative  . Not on file   Social Determinants of Health   Financial Resource Strain: Not on file  Food Insecurity: Not on file  Transportation Needs: Not on file  Physical Activity: Not on file  Stress: Not on file  Social Connections: Not on file   Outpatient Medications Prior to Visit  Medication Sig  . cyclobenzaprine (FLEXERIL) 5 MG tablet Take 1-2 tablets (5-10 mg total) by mouth 3 (three) times daily as needed for muscle spasms.  . famotidine (PEPCID) 20 MG tablet Take 1 tablet (20 mg total) by mouth 2 (two) times daily.  . meloxicam (MOBIC) 15 MG tablet Take 1 tablet (15 mg total) by mouth daily.   No facility-administered medications prior to visit.   No Known Allergies   There is no immunization  history on file for this patient.  Health Maintenance  Topic Date Due  . Hepatitis C Screening  Never done  . COVID-19 Vaccine (1) Never done  . HIV Screening  Never done  . TETANUS/TDAP  Never done  . COLONOSCOPY (Pts 45-74yrs Insurance coverage will need to be confirmed)  Never done  . INFLUENZA VACCINE  Never done  . HPV VACCINES  Aged Out    Patient Care Team: Flinchum, Kelby Aline, FNP as PCP - General (Family Medicine)  Review of Systems  HENT: Positive for tinnitus.   Musculoskeletal: Positive for arthralgias.  All other systems reviewed and are negative.   {Labs  Heme  Chem  Endocrine  Serology  Results Review (  Objective    BP (!) 152/86   Pulse 72   Temp 98.1 F (36.7 C) (Oral)   Resp 16   Ht 5\' 7"  (1.702 m)   Wt (!) 337 lb (152.9 kg)    SpO2 100%   BMI 52.78 kg/m    Physical Exam Vitals reviewed.  Constitutional:      General: He is not in acute distress.    Appearance: Normal appearance. He is normal weight. He is not ill-appearing, toxic-appearing or diaphoretic.  HENT:     Head: Normocephalic and atraumatic.     Right Ear: Tympanic membrane, ear canal and external ear normal. There is no impacted cerumen.     Left Ear: Tympanic membrane, ear canal and external ear normal. There is no impacted cerumen.     Nose: Nose normal. No congestion or rhinorrhea.     Mouth/Throat:     Mouth: Mucous membranes are moist.  Eyes:     General: No scleral icterus.       Right eye: No discharge.        Left eye: No discharge.     Extraocular Movements: Extraocular movements intact.     Conjunctiva/sclera: Conjunctivae normal.     Pupils: Pupils are equal, round, and reactive to light.  Neck:     Vascular: No carotid bruit.  Cardiovascular:     Rate and Rhythm: Normal rate and regular rhythm.     Pulses: Normal pulses.     Heart sounds: Normal heart sounds.  Pulmonary:     Effort: Pulmonary effort is normal.     Breath sounds: Normal breath sounds.  Abdominal:     General: Bowel sounds are normal. There is no distension.     Palpations: Abdomen is soft. There is no mass.     Tenderness: There is no abdominal tenderness.     Hernia: No hernia is present.  Musculoskeletal:        General: Normal range of motion.     Cervical back: Normal range of motion and neck supple. No rigidity or tenderness.     Right lower leg: No edema.     Left lower leg: No edema.  Lymphadenopathy:     Cervical: No cervical adenopathy.  Skin:    General: Skin is warm.     Findings: No erythema or rash.  Neurological:     Mental Status: He is oriented to person, place, and time.  Psychiatric:        Mood and Affect: Mood normal.        Behavior: Behavior normal.        Thought Content: Thought content normal.        Judgment: Judgment  normal.     Depression Screen PHQ 2/9 Scores 04/15/2020  PHQ - 2 Score 0  PHQ- 9 Score 0   Results for orders placed or performed in visit on 04/15/20  POCT urinalysis dipstick  Result Value Ref Range   Color, UA yellow    Clarity, UA clear    Glucose, UA Negative Negative   Bilirubin, UA negative    Ketones, UA negative    Spec Grav, UA 1.010 1.010 - 1.025   Blood, UA negative    pH, UA 6.5 5.0 - 8.0   Protein, UA Negative Negative   Urobilinogen, UA 0.2 0.2 or 1.0 E.U./dL   Nitrite, UA negative    Leukocytes, UA Negative Negative   Appearance     Odor      Assessment & Plan     Fatigue, unspecified type - Plan: CBC with Differential/Platelet, Comprehensive metabolic panel, Lipid panel, TSH  Screening for blood or protein in urine - Plan: POCT urinalysis dipstick  Screening for malignant neoplasm of prostate - Plan: PSA  Body mass index (BMI) of 50-59.9 in adult [facility])  Single episode of elevated blood pressure  Vitamin D deficiency - Plan: VITAMIN D 25 Hydroxy (Vit-D Deficiency, Fractures)  No orders of the defined types were placed in this encounter.  Orders Placed This Encounter  Procedures  . CBC with Differential/Platelet  . Comprehensive metabolic panel  . Lipid panel  . TSH  . VITAMIN D 25 Hydroxy (Vit-D Deficiency, Fractures)  . PSA  . POCT urinalysis dipstick   Red Flags discussed. The patient was given clear instructions to go to ER or return to medical center if any red flags develop, symptoms do not improve, worsen or new problems develop. They verbalized understanding.  DIET AND LIFESTYLE CHANGES ADVISED.   Return in about 1 month (around 05/16/2020), or if symptoms worsen or fail to improve, for at any time for any worsening symptoms, Go to Emergency room/ urgent care if worse.    The entirety of the information documented in the History of Present Illness, Review of Systems and Physical Exam were personally obtained by me. Portions of this  information were initially documented by the CMA and reviewed by me for thoroughness and accuracy.      Marcille Buffy, [place] [phone] (phone) [phone] (fax)  [place]

## 2020-04-16 ENCOUNTER — Encounter: Payer: Self-pay | Admitting: Adult Health

## 2020-04-19 ENCOUNTER — Other Ambulatory Visit: Payer: Self-pay | Admitting: Adult Health

## 2020-04-19 DIAGNOSIS — Z1211 Encounter for screening for malignant neoplasm of colon: Secondary | ICD-10-CM

## 2020-04-19 NOTE — Progress Notes (Signed)
Orders Placed This Encounter  Procedures  . Ambulatory referral to Gastroenterology    Referral Priority:  Routine    Referral Type:  Consultation    Referral Reason:  Specialty Services Required    Number of Visits Requested:  1     

## 2020-04-26 DIAGNOSIS — R7401 Elevation of levels of liver transaminase levels: Secondary | ICD-10-CM | POA: Diagnosis not present

## 2020-04-26 DIAGNOSIS — R7309 Other abnormal glucose: Secondary | ICD-10-CM | POA: Diagnosis not present

## 2020-04-26 DIAGNOSIS — E559 Vitamin D deficiency, unspecified: Secondary | ICD-10-CM | POA: Diagnosis not present

## 2020-04-26 DIAGNOSIS — R5383 Other fatigue: Secondary | ICD-10-CM | POA: Diagnosis not present

## 2020-04-26 DIAGNOSIS — Z125 Encounter for screening for malignant neoplasm of prostate: Secondary | ICD-10-CM | POA: Diagnosis not present

## 2020-04-27 LAB — CBC WITH DIFFERENTIAL/PLATELET
Basophils Absolute: 0.1 10*3/uL (ref 0.0–0.2)
Basos: 1 %
EOS (ABSOLUTE): 0.2 10*3/uL (ref 0.0–0.4)
Eos: 3 %
Hematocrit: 44.3 % (ref 37.5–51.0)
Hemoglobin: 14.7 g/dL (ref 13.0–17.7)
Immature Grans (Abs): 0 10*3/uL (ref 0.0–0.1)
Immature Granulocytes: 1 %
Lymphocytes Absolute: 1.8 10*3/uL (ref 0.7–3.1)
Lymphs: 25 %
MCH: 30.3 pg (ref 26.6–33.0)
MCHC: 33.2 g/dL (ref 31.5–35.7)
MCV: 91 fL (ref 79–97)
Monocytes Absolute: 0.6 10*3/uL (ref 0.1–0.9)
Monocytes: 8 %
Neutrophils Absolute: 4.4 10*3/uL (ref 1.4–7.0)
Neutrophils: 62 %
Platelets: 242 10*3/uL (ref 150–450)
RBC: 4.85 x10E6/uL (ref 4.14–5.80)
RDW: 13.2 % (ref 11.6–15.4)
WBC: 7.1 10*3/uL (ref 3.4–10.8)

## 2020-04-27 LAB — VITAMIN D 25 HYDROXY (VIT D DEFICIENCY, FRACTURES): Vit D, 25-Hydroxy: 19.8 ng/mL — ABNORMAL LOW (ref 30.0–100.0)

## 2020-04-27 LAB — COMPREHENSIVE METABOLIC PANEL
ALT: 56 IU/L — ABNORMAL HIGH (ref 0–44)
AST: 83 IU/L — ABNORMAL HIGH (ref 0–40)
Albumin/Globulin Ratio: 1 — ABNORMAL LOW (ref 1.2–2.2)
Albumin: 4 g/dL (ref 3.8–4.9)
Alkaline Phosphatase: 91 IU/L (ref 44–121)
BUN/Creatinine Ratio: 12 (ref 9–20)
BUN: 12 mg/dL (ref 6–24)
Bilirubin Total: 0.5 mg/dL (ref 0.0–1.2)
CO2: 21 mmol/L (ref 20–29)
Calcium: 9.2 mg/dL (ref 8.7–10.2)
Chloride: 101 mmol/L (ref 96–106)
Creatinine, Ser: 0.99 mg/dL (ref 0.76–1.27)
Globulin, Total: 3.9 g/dL (ref 1.5–4.5)
Glucose: 134 mg/dL — ABNORMAL HIGH (ref 65–99)
Potassium: 4.8 mmol/L (ref 3.5–5.2)
Sodium: 137 mmol/L (ref 134–144)
Total Protein: 7.9 g/dL (ref 6.0–8.5)
eGFR: 91 mL/min/{1.73_m2} (ref >59)

## 2020-04-27 LAB — LIPID PANEL
Chol/HDL Ratio: 5.7 ratio — ABNORMAL HIGH (ref 0.0–5.0)
Cholesterol, Total: 200 mg/dL — ABNORMAL HIGH (ref 100–199)
HDL: 35 mg/dL — ABNORMAL LOW (ref >39)
LDL Chol Calc (NIH): 137 mg/dL — ABNORMAL HIGH (ref 0–99)
Triglycerides: 152 mg/dL — ABNORMAL HIGH (ref 0–149)
VLDL Cholesterol Cal: 28 mg/dL (ref 5–40)

## 2020-04-27 LAB — TSH: TSH: 3.27 u[IU]/mL (ref 0.450–4.500)

## 2020-04-27 LAB — PSA: Prostate Specific Ag, Serum: 0.8 ng/mL (ref 0.0–4.0)

## 2020-04-27 NOTE — Progress Notes (Signed)
Please add on hemoglobin A1C. Glucose is elevated. AST and ALT elevated mildly add on acute hepatitis panel and GGT if able. Advise patient to avoid alcohol and excessive tylenol if taking.  Total cholesterol, triglycerides, and LDL elevated.  Discuss lifestyle modification with patient e.g. increase exercise, fiber, fruits, vegetables, lean meat, and omega 3/fish intake and decrease saturated fat.  If patient following strict diet and exercise program already please schedule follow up appointment with primary care physician. Likely need to consider cholesterol lowering medication,  Triglycerides elevated likely due to poor glucose control. Increase exercise and diet changes. May need follow up depending on Hemoglobin A1C to discuss.  TSH thyroid within normal limits.  Vitamin  D is low, this can contribute to poor sleep and fatigue, will send in prescription for Vitamin D at 50,000 units by mouth once every 7 days/(once weekly) for 12 weeks. Advise recheck lab Vitamin D in 1-2 weeks after completing vitamin d prescription. Lab iis walk in and is closed during lunch during regular office hours.If he wants vitamin D let me know.  Message was sent to [facility].

## 2020-04-29 LAB — HEPATITIS PANEL, ACUTE
Hep A IgM: NEGATIVE
Hep B C IgM: NEGATIVE
Hep C Virus Ab: <0.1 s/co ratio (ref 0.0–0.9)
Hepatitis B Surface Ag: NEGATIVE

## 2020-04-29 LAB — HEMOGLOBIN A1C
Est. average glucose Bld gHb Est-mCnc: 154 mg/dL
Hgb A1c MFr Bld: 7 % — ABNORMAL HIGH (ref 4.8–5.6)

## 2020-04-29 LAB — SPECIMEN STATUS REPORT

## 2020-04-29 LAB — GAMMA GT: GGT: 96 IU/L — ABNORMAL HIGH (ref 0–65)

## 2020-04-29 NOTE — Progress Notes (Signed)
Hepatitis panel negative. GGT is elevated indicating that7 the elevated liver enzymes are likely from liver origin. Will recheck as discussed below.  Hemoglobin A1C is 7 this is diabetic range, recommend he start a diet to reduce sugar intake and increase exercise.Can schedule follow up to discuss getting monitor to check glucose and medication. See previous note below:   Please add on hemoglobin A1C. Glucose is elevated. AST and ALT elevated mildly add on acute hepatitis panel and GGT if able. Advise patient to avoid alcohol and excessive tylenol if taking.  Total cholesterol, triglycerides, and LDL elevated. Discuss lifestyle modification with patient e.g. increase exercise, fiber, fruits, vegetables, lean meat, and omega 3/fish intake and decrease saturated fat. If patient following strict diet and exercise program already please schedule follow up appointment with primary care physician. Likely need to consider cholesterol lowering medication,  Triglycerides elevated likely due to poor glucose control. Increase exercise and diet changes. May need follow up depending on Hemoglobin A1C to discuss.  TSH thyroid within normal limits.  Vitamin D is low, this can contribute to poor sleep and fatigue, will send in prescription for Vitamin D at 50,000 units by mouth once every 7 days/(once weekly) for 12 weeks. Advise recheck lab Vitamin D in 1-2 weeks after completing vitamin d prescription. Lab iis walk in and is closed during lunch during regular office hours.If he wants vitamin D let me know.  Message was sent to [facility].   CMP and Hemoglobin A1C recheck lab advised in 3 months.

## 2020-05-04 ENCOUNTER — Other Ambulatory Visit: Payer: Self-pay

## 2020-05-04 ENCOUNTER — Telehealth (INDEPENDENT_AMBULATORY_CARE_PROVIDER_SITE_OTHER): Payer: Self-pay | Admitting: Gastroenterology

## 2020-05-04 DIAGNOSIS — Z860100 Personal history of colon polyps, unspecified: Secondary | ICD-10-CM

## 2020-05-04 MED ORDER — PEG 3350-KCL-NA BICARB-NACL 420 G PO SOLR: 4000.0000 mL | Freq: Once | ORAL | 0 refills | Status: AC

## 2020-05-04 NOTE — Progress Notes (Signed)
Gastroenterology Pre-Procedure Review  Request Date: Friday 05/27/20 Requesting Physician: Dr. Bonna Gains  PATIENT REVIEW QUESTIONS: The patient responded to the following health history questions as indicated:    1. Are you having any GI issues? no 2. Do you have a personal history of Polyps? yes (04/26/17 Flex Sig performed by Dr. Evalyn Casco noted polyps) 3. Do you have a family history of Colon Cancer or Polyps? no 4. Diabetes Mellitus? no 5. Joint replacements in the past 12 months?no 6. Major health problems in the past 3 months?no 7. Any artificial heart valves, MVP, or defibrillator?no    MEDICATIONS & ALLERGIES:    Patient reports the following regarding taking any anticoagulation/antiplatelet therapy:   Plavix, Coumadin, Eliquis, Xarelto, Lovenox, Pradaxa, Brilinta, or Effient? no Aspirin? no  Patient confirms/reports the following medications:  Current Outpatient Medications  Medication Sig Dispense Refill  . cyclobenzaprine (FLEXERIL) 5 MG tablet Take 1-2 tablets (5-10 mg total) by mouth 3 (three) times daily as needed for muscle spasms. 20 tablet 0  . meloxicam (MOBIC) 15 MG tablet Take 1 tablet (15 mg total) by mouth daily. 15 tablet 0  . famotidine (PEPCID) 20 MG tablet Take 1 tablet (20 mg total) by mouth 2 (two) times daily. 60 tablet 0   No current facility-administered medications for this visit.    Patient confirms/reports the following allergies:  No Known Allergies  No orders of the defined types were placed in this encounter.   AUTHORIZATION INFORMATION Primary Insurance: 1D#: Group #:  Secondary Insurance: 1D#: Group #:  SCHEDULE INFORMATION: Date: Friday 05/27/20 Time: Location:[facility]

## 2020-05-11 ENCOUNTER — Encounter: Payer: Self-pay | Admitting: Adult Health

## 2020-05-13 NOTE — Patient Instructions (Addendum)
https://www.diabeteseducator.org/docs/default-source/living-with-diabetes/conquering-the-grocery-store-v1.pdf?[email or web address]">  Carbohydrate Counting for Diabetes Mellitus, Adult Carbohydrate counting is a method of keeping track of how many carbohydrates you eat. Eating carbohydrates naturally increases the amount of sugar (glucose) in the blood. Counting how many carbohydrates you eat improves your blood glucose control, which helps you manage your diabetes. It is important to know how many carbohydrates you can safely have in each meal. This is different for every person. A dietitian can help you make a meal plan and calculate how many carbohydrates you should have at each meal and snack. What foods contain carbohydrates? Carbohydrates are found in the following foods:  Grains, such as breads and cereals.  Dried beans and soy products.  Starchy vegetables, such as potatoes, peas, and corn.  Fruit and fruit juices.  Milk and yogurt.  Sweets and snack foods, such as cake, cookies, candy, chips, and soft drinks.   How do I count carbohydrates in foods? There are two ways to count carbohydrates in food. You can read food labels or learn standard serving sizes of foods. You can use either of the methods or a combination of both. Using the Nutrition Facts label The Nutrition Facts list is included on the labels of almost all packaged foods and beverages in the [place] It includes:  The serving size.  Information about nutrients in each serving, including the grams (g) of carbohydrate per serving. To use the Nutrition Facts:  Decide how many servings you will have.  Multiply the number of servings by the number of carbohydrates per serving.  The resulting number is the total amount of carbohydrates that you will be having. Learning the standard serving sizes of foods When you eat carbohydrate foods that are not packaged or do not include Nutrition Facts on the label, you need to measure  the servings in order to count the amount of carbohydrates.  Measure the foods that you will eat with a food scale or measuring cup, if needed.  Decide how many standard-size servings you will eat.  Multiply the number of servings by 15. For foods that contain carbohydrates, one serving equals 15 g of carbohydrates. ? For example, if you eat 2 cups or 10 oz (300 g) of strawberries, you will have eaten 2 servings and 30 g of carbohydrates (2 servings x 15 g = 30 g).  For foods that have more than one food mixed, such as soups and casseroles, you must count the carbohydrates in each food that is included. The following list contains standard serving sizes of common carbohydrate-rich foods. Each of these servings has about 15 g of carbohydrates:  1 slice of bread.  1 six-inch (15 cm) tortilla.  ? cup or 2 oz (53 g) cooked rice or pasta.   cup or 3 oz (85 g) cooked or canned, drained and rinsed beans or lentils.   cup or 3 oz (85 g) starchy vegetable, such as peas, corn, or squash.   cup or 4 oz (120 g) hot cereal.   cup or 3 oz (85 g) boiled or mashed potatoes, or  or 3 oz (85 g) of a large baked potato.   cup or 4 fl oz (118 mL) fruit juice.  1 cup or 8 fl oz (237 mL) milk.  1 small or 4 oz (106 g) apple.   or 2 oz (63 g) of a medium banana.  1 cup or 5 oz (150 g) strawberries.  3 cups or 1 oz (24 g) popped popcorn. What is  an example of carbohydrate counting? To calculate the number of carbohydrates in this sample meal, follow the steps shown below. Sample meal  3 oz (85 g) chicken breast.  ? cup or 4 oz (106 g) brown rice.   cup or 3 oz (85 g) corn.  1 cup or 8 fl oz (237 mL) milk.  1 cup or 5 oz (150 g) strawberries with sugar-free whipped topping. Carbohydrate calculation 1. Identify the foods that contain carbohydrates: ? Rice. ? Corn. ? Milk. ? Strawberries. 2. Calculate how many servings you have of each food: ? 2 servings rice. ? 1 serving  corn. ? 1 serving milk. ? 1 serving strawberries. 3. Multiply each number of servings by 15 g: ? 2 servings rice x 15 g = 30 g. ? 1 serving corn x 15 g = 15 g. ? 1 serving milk x 15 g = 15 g. ? 1 serving strawberries x 15 g = 15 g. 4. Add together all of the amounts to find the total grams of carbohydrates eaten: ? 30 g + 15 g + 15 g + 15 g = 75 g of carbohydrates total. What are tips for following this plan? Shopping  Develop a meal plan and then make a shopping list.  Buy fresh and frozen vegetables, fresh and frozen fruit, dairy, eggs, beans, lentils, and whole grains.  Look at food labels. Choose foods that have more fiber and less sugar.  Avoid processed foods and foods with added sugars. Meal planning  Aim to have the same amount of carbohydrates at each meal and for each snack time.  Plan to have regular, balanced meals and snacks. Where to find more information  American Diabetes Association: www.diabetes.org  Centers for Disease Control and Prevention: http://www.wolf.info/ Summary  Carbohydrate counting is a method of keeping track of how many carbohydrates you eat.  Eating carbohydrates naturally increases the amount of sugar (glucose) in the blood.  Counting how many carbohydrates you eat improves your blood glucose control, which helps you manage your diabetes.  A dietitian can help you make a meal plan and calculate how many carbohydrates you should have at each meal and snack. This information is not intended to replace advice given to you by your health care provider. Make sure you discuss any questions you have with your health care provider. Document Revised: 01/22/2019 Document Reviewed: 01/23/2019 Elsevier Patient Education  2021 [place].  Diabetes Mellitus and Nutrition, Adult When you have diabetes, or diabetes mellitus, it is very important to have healthy eating habits because your blood sugar (glucose) levels are greatly affected by what you eat and  drink. Eating healthy foods in the right amounts, at about the same times every day, can help you:  Control your blood glucose.  Lower your risk of heart disease.  Improve your blood pressure.  Reach or maintain a healthy weight. What can affect my meal plan? Every person with diabetes is different, and each person has different needs for a meal plan. Your health care provider may recommend that you work with a dietitian to make a meal plan that is best for you. Your meal plan may vary depending on factors such as:  The calories you need.  The medicines you take.  Your weight.  Your blood glucose, blood pressure, and cholesterol levels.  Your activity level.  Other health conditions you have, such as heart or kidney disease. How do carbohydrates affect me? Carbohydrates, also called carbs, affect your blood glucose level more  than any other type of food. Eating carbs naturally raises the amount of glucose in your blood. Carb counting is a method for keeping track of how many carbs you eat. Counting carbs is important to keep your blood glucose at a healthy level, especially if you use insulin or take certain oral diabetes medicines. It is important to know how many carbs you can safely have in each meal. This is different for every person. Your dietitian can help you calculate how many carbs you should have at each meal and for each snack. How does alcohol affect me? Alcohol can cause a sudden decrease in blood glucose (hypoglycemia), especially if you use insulin or take certain oral diabetes medicines. Hypoglycemia can be a life-threatening condition. Symptoms of hypoglycemia, such as sleepiness, dizziness, and confusion, are similar to symptoms of having too much alcohol.  Do not drink alcohol if: ? Your health care provider tells you not to drink. ? You are pregnant, may be pregnant, or are planning to become pregnant.  If you drink alcohol: ? Do not drink on an empty  stomach. ? Limit how much you use to:  0-1 drink a day for women.  0-2 drinks a day for men. ? Be aware of how much alcohol is in your drink. In the [place] one drink equals one 12 oz bottle of beer (355 mL), one 5 oz glass of wine (148 mL), or one 1 oz glass of hard liquor (44 mL). ? Keep yourself hydrated with water, diet soda, or unsweetened iced tea.  Keep in mind that regular soda, juice, and other mixers may contain a lot of sugar and must be counted as carbs. What are tips for following this plan? Reading food labels  Start by checking the serving size on the "Nutrition Facts" label of packaged foods and drinks. The amount of calories, carbs, fats, and other nutrients listed on the label is based on one serving of the item. Many items contain more than one serving per package.  Check the total grams (g) of carbs in one serving. You can calculate the number of servings of carbs in one serving by dividing the total carbs by 15. For example, if a food has 30 g of total carbs per serving, it would be equal to 2 servings of carbs.  Check the number of grams (g) of saturated fats and trans fats in one serving. Choose foods that have a low amount or none of these fats.  Check the number of milligrams (mg) of salt (sodium) in one serving. Most people should limit total sodium intake to less than 2,300 mg per day.  Always check the nutrition information of foods labeled as "low-fat" or "nonfat." These foods may be higher in added sugar or refined carbs and should be avoided.  Talk to your dietitian to identify your daily goals for nutrients listed on the label. Shopping  Avoid buying canned, pre-made, or processed foods. These foods tend to be high in fat, sodium, and added sugar.  Shop around the outside edge of the grocery store. This is where you will most often find fresh fruits and vegetables, bulk grains, fresh meats, and fresh dairy. Cooking  Use low-heat cooking methods, such as  baking, instead of high-heat cooking methods like deep frying.  Cook using healthy oils, such as olive, canola, or sunflower oil.  Avoid cooking with butter, cream, or high-fat meats. Meal planning  Eat meals and snacks regularly, preferably at the same times every day. Avoid  going long periods of time without eating.  Eat foods that are high in fiber, such as fresh fruits, vegetables, beans, and whole grains. Talk with your dietitian about how many servings of carbs you can eat at each meal.  Eat 4-6 oz (112-168 g) of lean protein each day, such as lean meat, chicken, fish, eggs, or tofu. One ounce (oz) of lean protein is equal to: ? 1 oz (28 g) of meat, chicken, or fish. ? 1 egg. ?  cup (62 g) of tofu.  Eat some foods each day that contain healthy fats, such as avocado, nuts, seeds, and fish.   What foods should I eat? Fruits Berries. Apples. Oranges. Peaches. Apricots. Plums. Grapes. Mango. Papaya. Pomegranate. Kiwi. Cherries. Vegetables Lettuce. Spinach. Leafy greens, including kale, chard, collard greens, and mustard greens. Beets. Cauliflower. Cabbage. Broccoli. Carrots. Green beans. Tomatoes. Peppers. Onions. Cucumbers. Brussels sprouts. Grains Whole grains, such as whole-wheat or whole-grain bread, crackers, tortillas, cereal, and pasta. Unsweetened oatmeal. Quinoa. Brown or wild rice. Meats and other proteins Seafood. Poultry without skin. Lean cuts of poultry and beef. Tofu. Nuts. Seeds. Dairy Low-fat or fat-free dairy products such as milk, yogurt, and cheese. The items listed above may not be a complete list of foods and beverages you can eat. Contact a dietitian for more information. What foods should I avoid? Fruits Fruits canned with syrup. Vegetables Canned vegetables. Frozen vegetables with butter or cream sauce. Grains Refined white flour and flour products such as bread, pasta, snack foods, and cereals. Avoid all processed foods. Meats and other proteins Fatty  cuts of meat. Poultry with skin. Breaded or fried meats. Processed meat. Avoid saturated fats. Dairy Full-fat yogurt, cheese, or milk. Beverages Sweetened drinks, such as soda or iced tea. The items listed above may not be a complete list of foods and beverages you should avoid. Contact a dietitian for more information. Questions to ask a health care provider  Do I need to meet with a diabetes educator?  Do I need to meet with a dietitian?  What number can I call if I have questions?  When are the best times to check my blood glucose? Where to find more information:  American Diabetes Association: diabetes.org  Academy of Nutrition and Dietetics: www.eatright.[employer] of Diabetes and Digestive and Kidney Diseases: [employer]  Association of Diabetes Care and Education Specialists: www.diabeteseducator.org Summary  It is important to have healthy eating habits because your blood sugar (glucose) levels are greatly affected by what you eat and drink.  A healthy meal plan will help you control your blood glucose and maintain a healthy lifestyle.  Your health care provider may recommend that you work with a dietitian to make a meal plan that is best for you.  Keep in mind that carbohydrates (carbs) and alcohol have immediate effects on your blood glucose levels. It is important to count carbs and to use alcohol carefully. This information is not intended to replace advice given to you by your health care provider. Make sure you discuss any questions you have with your health care provider. Document Revised: 12/30/2018 Document Reviewed: 12/30/2018 Elsevier Patient Education  2021 [employer].  Blood Glucose Monitoring, Adult Monitoring your blood sugar (glucose) is an important part of managing your diabetes. Blood glucose monitoring involves checking your blood glucose as often as directed and keeping a log or record of your results over time. Checking your  blood glucose regularly and keeping a blood glucose log can:  Help you and  your health care provider adjust your diabetes management plan as needed, including your medicines or insulin.  Help you understand how food, exercise, illnesses, and medicines affect your blood glucose.  Let you know what your blood glucose is at any time. You can quickly find out if you have low blood glucose (hypoglycemia) or high blood glucose (hyperglycemia). Your health care provider will set individualized treatment goals for you. Your goals will be based on your age, other medical conditions you have, and how you respond to diabetes treatment. Generally, the goal of treatment is to maintain the following blood glucose levels:  Before meals (preprandial): 80-130 mg/dL (4.4-7.2 mmol/L).  After meals (postprandial): below 180 mg/dL (10 mmol/L).  A1C level: less than 7%. Supplies needed:  Blood glucose meter.  Test strips for your meter. Each meter has its own strips. You must use the strips that came with your meter.  A needle to prick your finger (lancet). Do not use a lancet more than one time.  A device that holds the lancet (lancing device).  A journal or log book to write down your results. How to check your blood glucose Checking your blood glucose 1. Wash your hands for at least 20 seconds with soap and water. 2. Prick the side of your finger (not the tip) with the lancet. Do not use the same finger consecutively. 3. Gently rub the finger until a small drop of blood appears. 4. Follow instructions that come with your meter for inserting the test strip, applying blood to the strip, and using your blood glucose meter. 5. Write down your result and any notes in your log.   Using alternative sites Some meters allow you to use areas of your body other than your finger (alternative sites) to test your blood. The most common alternative sites are the forearm, the thigh, and the palm of your  hand. Alternative sites may not be as accurate as the fingers because blood flow is slower in those areas. This means that the result you get may be delayed, and it may be different from the result that you would get from your finger. Use the finger only, and do not use alternative sites, if:  You think you have hypoglycemia.  You sometimes do not know that your blood glucose is getting low (hypoglycemia unawareness). General tips and recommendations Blood glucose log  Every time you check your blood glucose, write down your result. Also write down any notes about things that may be affecting your blood glucose, such as your diet and exercise for the day. This information can help you and your health care provider: ? Look for patterns in your blood glucose over time. ? Adjust your diabetes management plan as needed.  Check if your meter allows you to download your records to a computer or if there is an app for the meter. Most glucose meters store a record of glucose readings in the meter.   If you have type 1 diabetes:  Check your blood glucose 4 or more times a day if you are on intensive insulin therapy with multiple daily injections (MDI) or if you are using an insulin pump. Check your blood glucose: ? Before every meal and snack. ? Before bedtime.  Also check your blood glucose: ? If you have symptoms of hypoglycemia. ? After treating low blood glucose. ? Before doing activities that create a risk for injury, like driving or using machinery. ? Before and after exercise. ? Two hours after a meal. ?  Occasionally between 2:00 a.m. and 3:00 a.m., as directed.  You may need to check your blood glucose more often, 6-10 times per day, if: ? You have diabetes that is not well controlled. ? You are ill. ? You have a history of severe hypoglycemia. ? You have hypoglycemia unawareness. If you have type 2 diabetes:  Check your blood glucose 2 or more times a day if you take insulin or  other diabetes medicines.  Check your blood glucose 4 or more times a day if you are on intensive insulin therapy. Occasionally, you may also need to check your glucose between 2:00 a.m. and 3:00 a.m., as directed.  Also check your blood glucose: ? Before and after exercise. ? Before doing activities that create a risk for injury, like driving or using machinery.  You may need to check your blood glucose more often if: ? Your medicine is being adjusted. ? Your diabetes is not well controlled. ? You are ill. General tips  Make sure you always have your supplies with you.  After you use a few boxes of test strips, adjust (calibrate) your blood glucose meter by following instructions that came with your meter.  If you have questions or need help, all blood glucose meters have a 24-hour hotline phone number available that you can call. Also contact your health care provider with questions or concerns you may have. Where to find more information  The American Diabetes Association: www.diabetes.org  The Association of Diabetes Care & Education Specialists: www.diabeteseducator.org Contact a health care provider if:  Your blood glucose is at or above 240 mg/dL (13.3 mmol/L) for 2 days in a row.  You have been sick or have had a fever for 2 days or longer, and you are not getting better.  You have any of the following problems for more than 6 hours: ? You cannot eat or drink. ? You have nausea or vomiting. ? You have diarrhea. Get help right away if:  Your blood glucose is lower than 54 mg/dL (3 mmol/L).  You become confused, or you have trouble thinking clearly.  You have difficulty breathing.  You have moderate or large ketone levels in your urine. These symptoms may represent a serious problem that is an emergency. Do not wait to see if the symptoms will go away. Get medical help right away. Call your local emergency services (911 in the [place] Do not drive yourself to the  hospital. Summary  Monitoring your blood glucose is an important part of managing your diabetes.  Blood glucose monitoring involves checking your blood glucose as often as directed and keeping a log or record of your results over time.  Your health care provider will set individualized treatment goals for you. Your goals will be based on your age, other medical conditions you have, and how you respond to diabetes treatment.  Every time you check your blood glucose, write down your result. Also, write down any notes about things that may be affecting your blood glucose, such as your diet and exercise for the day. This information is not intended to replace advice given to you by your health care provider. Make sure you discuss any questions you have with your health care provider. Document Revised: 10/21/2019 Document Reviewed: 10/21/2019 Elsevier Patient Education  2021 [place]. Diabetes Mellitus and Nutrition, Adult When you have diabetes, or diabetes mellitus, it is very important to have healthy eating habits because your blood sugar (glucose) levels are greatly affected by what you  eat and drink. Eating healthy foods in the right amounts, at about the same times every day, can help you:  Control your blood glucose.  Lower your risk of heart disease.  Improve your blood pressure.  Reach or maintain a healthy weight. What can affect my meal plan? Every person with diabetes is different, and each person has different needs for a meal plan. Your health care provider may recommend that you work with a dietitian to make a meal plan that is best for you. Your meal plan may vary depending on factors such as:  The calories you need.  The medicines you take.  Your weight.  Your blood glucose, blood pressure, and cholesterol levels.  Your activity level.  Other health conditions you have, such as heart or kidney disease. How do carbohydrates affect me? Carbohydrates, also called  carbs, affect your blood glucose level more than any other type of food. Eating carbs naturally raises the amount of glucose in your blood. Carb counting is a method for keeping track of how many carbs you eat. Counting carbs is important to keep your blood glucose at a healthy level, especially if you use insulin or take certain oral diabetes medicines. It is important to know how many carbs you can safely have in each meal. This is different for every person. Your dietitian can help you calculate how many carbs you should have at each meal and for each snack. How does alcohol affect me? Alcohol can cause a sudden decrease in blood glucose (hypoglycemia), especially if you use insulin or take certain oral diabetes medicines. Hypoglycemia can be a life-threatening condition. Symptoms of hypoglycemia, such as sleepiness, dizziness, and confusion, are similar to symptoms of having too much alcohol.  Do not drink alcohol if: ? Your health care provider tells you not to drink. ? You are pregnant, may be pregnant, or are planning to become pregnant.  If you drink alcohol: ? Do not drink on an empty stomach. ? Limit how much you use to:  0-1 drink a day for women.  0-2 drinks a day for men. ? Be aware of how much alcohol is in your drink. In the [place] one drink equals one 12 oz bottle of beer (355 mL), one 5 oz glass of wine (148 mL), or one 1 oz glass of hard liquor (44 mL). ? Keep yourself hydrated with water, diet soda, or unsweetened iced tea.  Keep in mind that regular soda, juice, and other mixers may contain a lot of sugar and must be counted as carbs. What are tips for following this plan? Reading food labels  Start by checking the serving size on the "Nutrition Facts" label of packaged foods and drinks. The amount of calories, carbs, fats, and other nutrients listed on the label is based on one serving of the item. Many items contain more than one serving per package.  Check the total  grams (g) of carbs in one serving. You can calculate the number of servings of carbs in one serving by dividing the total carbs by 15. For example, if a food has 30 g of total carbs per serving, it would be equal to 2 servings of carbs.  Check the number of grams (g) of saturated fats and trans fats in one serving. Choose foods that have a low amount or none of these fats.  Check the number of milligrams (mg) of salt (sodium) in one serving. Most people should limit total sodium intake to less than 2,300  mg per day.  Always check the nutrition information of foods labeled as "low-fat" or "nonfat." These foods may be higher in added sugar or refined carbs and should be avoided.  Talk to your dietitian to identify your daily goals for nutrients listed on the label. Shopping  Avoid buying canned, pre-made, or processed foods. These foods tend to be high in fat, sodium, and added sugar.  Shop around the outside edge of the grocery store. This is where you will most often find fresh fruits and vegetables, bulk grains, fresh meats, and fresh dairy. Cooking  Use low-heat cooking methods, such as baking, instead of high-heat cooking methods like deep frying.  Cook using healthy oils, such as olive, canola, or sunflower oil.  Avoid cooking with butter, cream, or high-fat meats. Meal planning  Eat meals and snacks regularly, preferably at the same times every day. Avoid going long periods of time without eating.  Eat foods that are high in fiber, such as fresh fruits, vegetables, beans, and whole grains. Talk with your dietitian about how many servings of carbs you can eat at each meal.  Eat 4-6 oz (112-168 g) of lean protein each day, such as lean meat, chicken, fish, eggs, or tofu. One ounce (oz) of lean protein is equal to: ? 1 oz (28 g) of meat, chicken, or fish. ? 1 egg. ?  cup (62 g) of tofu.  Eat some foods each day that contain healthy fats, such as avocado, nuts, seeds, and fish.    What foods should I eat? Fruits Berries. Apples. Oranges. Peaches. Apricots. Plums. Grapes. Mango. Papaya. Pomegranate. Kiwi. Cherries. Vegetables Lettuce. Spinach. Leafy greens, including kale, chard, collard greens, and mustard greens. Beets. Cauliflower. Cabbage. Broccoli. Carrots. Green beans. Tomatoes. Peppers. Onions. Cucumbers. Brussels sprouts. Grains Whole grains, such as whole-wheat or whole-grain bread, crackers, tortillas, cereal, and pasta. Unsweetened oatmeal. Quinoa. Brown or wild rice. Meats and other proteins Seafood. Poultry without skin. Lean cuts of poultry and beef. Tofu. Nuts. Seeds. Dairy Low-fat or fat-free dairy products such as milk, yogurt, and cheese. The items listed above may not be a complete list of foods and beverages you can eat. Contact a dietitian for more information. What foods should I avoid? Fruits Fruits canned with syrup. Vegetables Canned vegetables. Frozen vegetables with butter or cream sauce. Grains Refined white flour and flour products such as bread, pasta, snack foods, and cereals. Avoid all processed foods. Meats and other proteins Fatty cuts of meat. Poultry with skin. Breaded or fried meats. Processed meat. Avoid saturated fats. Dairy Full-fat yogurt, cheese, or milk. Beverages Sweetened drinks, such as soda or iced tea. The items listed above may not be a complete list of foods and beverages you should avoid. Contact a dietitian for more information. Questions to ask a health care provider  Do I need to meet with a diabetes educator?  Do I need to meet with a dietitian?  What number can I call if I have questions?  When are the best times to check my blood glucose? Where to find more information:  American Diabetes Association: diabetes.org  Academy of Nutrition and Dietetics: www.eatright.[employer] of Diabetes and Digestive and Kidney Diseases: [employer]  Association of Diabetes Care and Education  Specialists: www.diabeteseducator.org Summary  It is important to have healthy eating habits because your blood sugar (glucose) levels are greatly affected by what you eat and drink.  A healthy meal plan will help you control your blood glucose and maintain a healthy  lifestyle.  Your health care provider may recommend that you work with a dietitian to make a meal plan that is best for you.  Keep in mind that carbohydrates (carbs) and alcohol have immediate effects on your blood glucose levels. It is important to count carbs and to use alcohol carefully. This information is not intended to replace advice given to you by your health care provider. Make sure you discuss any questions you have with your health care provider. Document Revised: 12/30/2018 Document Reviewed: 12/30/2018 Elsevier Patient Education  2021 [employer].

## 2020-05-13 NOTE — Progress Notes (Signed)
Established patient visit   [name]   DOB: [birth year]   55 y.o. Male  MRN: [id number] Visit Date: 05/16/2020  Today's healthcare provider: Marcille Buffy, FNP   Chief Complaint  Patient presents with  . Abnormal Lab    Patient presents in office today to review and discuss over lab results from 04/26/20.    Subjective    Diabetes He presents for his initial diabetic visit. He has type 2 diabetes mellitus.  hemoglobin A1C was 7.0. Glucose was 134. GGT was elevated 96. AST and ALT elevated 83 and 86.  Vitamin D low 19.8.   Takes tylenol occasionally. No alcohol.  Knee pain limits exercise.   Patient  denies any fever, body aches,chills, rash, chest pain, shortness of breath, nausea, vomiting, or diarrhea.  Denies dizziness, lightheadedness, pre syncopal or syncopal episodes.    HPI    Abnormal Lab     Additional comments: Patient presents in office today to review and discuss over lab results from 04/26/20.        Last edited by Minette Headland, CMA on 05/16/2020  8:10 AM. (History)       Patient Active Problem List   Diagnosis Date Noted  . Screening for malignant neoplasm of prostate 04/15/2020  . Fatigue 04/15/2020  . Body mass index (BMI) of 50-59.9 in adult ([place]) 04/15/2020  . Single episode of elevated blood pressure 04/15/2020   Social History   Tobacco Use  . Smoking status: Never Smoker  . Smokeless tobacco: Never Used  Vaping Use  . Vaping Use: Never used   No Known Allergies     Medications: Outpatient Medications Prior to Visit  Medication Sig  . aspirin EC 81 MG tablet Take 81 mg by mouth daily. Swallow whole.  . Multiple Vitamin (MULTIVITAMIN WITH MINERALS) TABS tablet Take 1 tablet by mouth daily.  . famotidine (PEPCID) 20 MG tablet Take 1 tablet (20 mg total) by mouth 2 (two) times daily.  . polyethylene glycol-electrolytes (NULYTELY) 420 g solution PLEASE SEE ATTACHED FOR DETAILED DIRECTIONS (Patient not  taking: Reported on 05/16/2020)  . [DISCONTINUED] cyclobenzaprine (FLEXERIL) 5 MG tablet Take 1-2 tablets (5-10 mg total) by mouth 3 (three) times daily as needed for muscle spasms.  . [DISCONTINUED] meloxicam (MOBIC) 15 MG tablet Take 1 tablet (15 mg total) by mouth daily.   No facility-administered medications prior to visit.    Review of Systems  Constitutional: Negative.   Respiratory: Negative.   Cardiovascular: Negative.   Neurological: Negative.     Last CBC Lab Results  Component Value Date   WBC 7.1 04/26/2020   HGB 14.7 04/26/2020   HCT 44.3 04/26/2020   MCV 91 04/26/2020   MCH 30.3 04/26/2020   RDW 13.2 04/26/2020   PLT 242 44/81/8563   Last metabolic panel Lab Results  Component Value Date   GLUCOSE 134 (H) 04/26/2020   NA 137 04/26/2020   K 4.8 04/26/2020   CL 101 04/26/2020   CO2 21 04/26/2020   BUN 12 04/26/2020   CREATININE 0.99 04/26/2020   GFRNONAA >60 05/08/2017   GFRAA >60 05/08/2017   CALCIUM 9.2 04/26/2020   PROT 7.9 04/26/2020   ALBUMIN 4.0 04/26/2020   LABGLOB 3.9 04/26/2020   AGRATIO 1.0 (L) 04/26/2020   BILITOT 0.5 04/26/2020   ALKPHOS 91 04/26/2020   AST 83 (H) 04/26/2020   ALT 56 (H) 04/26/2020   ANIONGAP 6 05/08/2017   Last lipids Lab Results  Component  Value Date   CHOL 200 (H) 04/26/2020   HDL 35 (L) 04/26/2020   LDLCALC 137 (H) 04/26/2020   TRIG 152 (H) 04/26/2020   CHOLHDL 5.7 (H) 04/26/2020   Last hemoglobin A1c Lab Results  Component Value Date   HGBA1C 7.0 (H) 04/26/2020   Last thyroid functions Lab Results  Component Value Date   TSH 3.270 04/26/2020   Last vitamin D Lab Results  Component Value Date   VD25OH 19.8 (L) 04/26/2020        Objective    BP 138/88   Pulse 73   Resp 16   Wt (!) 329 lb (149.2 kg)   SpO2 100%   BMI 51.53 kg/m  BP Readings from Last 3 Encounters:  05/16/20 138/88  04/15/20 (!) 152/86  07/09/19 (!) 151/87   Wt Readings from Last 3 Encounters:  05/16/20 (!) 329 lb (149.2  kg)  04/15/20 (!) 337 lb (152.9 kg)  07/09/19 (!) 325 lb (147.4 kg)      Physical Exam Constitutional:      General: He is not in acute distress.    Appearance: Normal appearance. He is well-developed. He is not ill-appearing, toxic-appearing or diaphoretic.  HENT:     Head: Normocephalic and atraumatic.     Right Ear: Hearing, tympanic membrane, ear canal and external ear normal.     Left Ear: Hearing, tympanic membrane, ear canal and external ear normal.     Nose: Nose normal.     Mouth/Throat:     Pharynx: Uvula midline. No oropharyngeal exudate.  Eyes:     General: Lids are normal. No scleral icterus.       Right eye: No discharge.        Left eye: No discharge.     Conjunctiva/sclera: Conjunctivae normal.     Pupils: Pupils are equal, round, and reactive to light.  Neck:     Thyroid: No thyromegaly.     Vascular: Normal carotid pulses. No carotid bruit, hepatojugular reflux or JVD.     Trachea: Trachea and phonation normal. No tracheal tenderness or tracheal deviation.     Meningeal: Brudzinski's sign absent.  Cardiovascular:     Rate and Rhythm: Normal rate and regular rhythm.     Pulses: Normal pulses.     Heart sounds: Normal heart sounds, S1 normal and S2 normal. Heart sounds not distant. No murmur heard. No friction rub. No gallop.   Pulmonary:     Effort: Pulmonary effort is normal. No accessory muscle usage or respiratory distress.     Breath sounds: Normal breath sounds. No stridor. No wheezing or rales.  Chest:     Chest wall: No tenderness.  Abdominal:     General: Bowel sounds are normal. There is no distension.     Palpations: Abdomen is soft. There is no mass.     Tenderness: There is no abdominal tenderness. There is no guarding or rebound.  Musculoskeletal:        General: No tenderness or deformity. Normal range of motion.     Cervical back: Full passive range of motion without pain, normal range of motion and neck supple.  Lymphadenopathy:     Head:      Right side of head: No submental, submandibular, tonsillar, preauricular, posterior auricular or occipital adenopathy.     Left side of head: No submental, submandibular, tonsillar, preauricular, posterior auricular or occipital adenopathy.     Cervical: No cervical adenopathy.  Skin:    General: Skin is warm  and dry.     Coloration: Skin is not pale.     Findings: No erythema or rash.     Nails: There is no clubbing.  Neurological:     Mental Status: He is alert and oriented to person, place, and time.     GCS: GCS eye subscore is 4. GCS verbal subscore is 5. GCS motor subscore is 6.     Cranial Nerves: No cranial nerve deficit.     Sensory: No sensory deficit.     Motor: No abnormal muscle tone.     Coordination: Coordination normal.     Deep Tendon Reflexes: Reflexes are normal and symmetric. Reflexes normal.  Psychiatric:        Speech: Speech normal.        Behavior: Behavior normal.        Thought Content: Thought content normal.        Judgment: Judgment normal.     No results found for any visits on 05/16/20.  Assessment & Plan    Vitamin D deficiency - Plan: HgB A1c, Vitamin D, Ergocalciferol, (DRISDOL) 1.25 MG (50000 UNIT) CAPS capsule  Hyperlipidemia, unspecified hyperlipidemia type - Plan: Comprehensive Metabolic Panel (CMET), VITAMIN D 25 Hydroxy (Vit-D Deficiency, Fractures), Lipid Panel w/o Chol/HDL Ratio  Diabetes mellitus without complication (HCC) - Plan: POCT UA - Microalbumin  Elevated liver enzymes - Plan: Comprehensive Metabolic Panel (CMET)  Elevated serum GGT level - Plan: Comprehensive Metabolic Panel (CMET)  Meds ordered this encounter  Medications  . Vitamin D, Ergocalciferol, (DRISDOL) 1.25 MG (50000 UNIT) CAPS capsule    Sig: Take 1 capsule (50,000 Units total) by mouth every 7 (seven) days. (taking one tablet per week) walk in lab in office 1-2 weeks after completing prescription.    Dispense:  12 capsule    Refill:  0  . blood glucose  meter kit and supplies    Sig: Dispense based on patient and insurance preference. Check glucose in the morning fasting and before meals. Use up to four times daily as directed. (FOR ICD-10 E10.9, E11.9).    Dispense:  1 each    Refill:  0    Order Specific Question:   Number of strips    Answer:   180    Order Specific Question:   Number of lancets    Answer:   180    Return in about 3 months (around 08/15/2020), or if symptoms worsen or fail to improve, for at any time for any worsening symptoms, Go to Emergency room/ urgent care if worse.      The entirety of the information documented in the History of Present Illness, Review of Systems and Physical Exam were personally obtained by me. Portions of this information were initially documented by the CMA and reviewed by me for thoroughness and accuracy.   Red Flags discussed. The patient was given clear instructions to go to ER or return to medical center if any red flags develop, symptoms do not improve, worsen or new problems develop. They verbalized understanding.    Marcille Buffy, [place] [phone] (phone) [phone] (fax)  [place]

## 2020-05-16 ENCOUNTER — Ambulatory Visit: Payer: BC Managed Care – PPO | Admitting: Adult Health

## 2020-05-16 ENCOUNTER — Encounter: Payer: Self-pay | Admitting: Adult Health

## 2020-05-16 ENCOUNTER — Other Ambulatory Visit: Payer: Self-pay

## 2020-05-16 VITALS — BP 138/88 | HR 73 | Resp 16 | Wt 329.0 lb

## 2020-05-16 DIAGNOSIS — R748 Abnormal levels of other serum enzymes: Secondary | ICD-10-CM

## 2020-05-16 DIAGNOSIS — E785 Hyperlipidemia, unspecified: Secondary | ICD-10-CM

## 2020-05-16 DIAGNOSIS — E559 Vitamin D deficiency, unspecified: Secondary | ICD-10-CM | POA: Diagnosis not present

## 2020-05-16 DIAGNOSIS — E119 Type 2 diabetes mellitus without complications: Secondary | ICD-10-CM

## 2020-05-16 MED ORDER — VITAMIN D (ERGOCALCIFEROL) 1.25 MG (50000 UNIT) PO CAPS: 50000.0000 [IU] | ORAL_CAPSULE | ORAL | 0 refills | Status: DC

## 2020-05-16 MED ORDER — BLOOD GLUCOSE METER KIT: PACK | 0 refills | Status: AC

## 2020-05-23 ENCOUNTER — Telehealth: Payer: Self-pay

## 2020-05-23 DIAGNOSIS — E119 Type 2 diabetes mellitus without complications: Secondary | ICD-10-CM | POA: Insufficient documentation

## 2020-05-23 DIAGNOSIS — E559 Vitamin D deficiency, unspecified: Secondary | ICD-10-CM | POA: Insufficient documentation

## 2020-05-23 DIAGNOSIS — E1169 Type 2 diabetes mellitus with other specified complication: Secondary | ICD-10-CM | POA: Insufficient documentation

## 2020-05-23 DIAGNOSIS — E785 Hyperlipidemia, unspecified: Secondary | ICD-10-CM

## 2020-05-23 DIAGNOSIS — R748 Abnormal levels of other serum enzymes: Secondary | ICD-10-CM | POA: Insufficient documentation

## 2020-05-23 HISTORY — DX: Hyperlipidemia, unspecified: E78.5

## 2020-05-23 NOTE — Telephone Encounter (Signed)
Per Dr. Bonna Gains, we dont use miralax prep since its not FDA approved. I lvm with patient informing him that this is not one of the preps we use since it is not FDA approved and may not lead to a good clean out  Nulytely Bowel prep has been sen to [facility] in [place] on 05/04/20.  Thanks,  [place], [place]

## 2020-05-27 ENCOUNTER — Encounter: Payer: Self-pay | Admitting: Gastroenterology

## 2020-05-27 ENCOUNTER — Ambulatory Visit
Admission: RE | Admit: 2020-05-27 | Discharge: 2020-05-27 | Disposition: A | Discharge status: Home / Self Care | Payer: BC Managed Care – PPO | Attending: Gastroenterology | Admitting: Gastroenterology

## 2020-05-27 ENCOUNTER — Encounter
Admission: RE | Disposition: A | Discharge status: Home / Self Care | Payer: Self-pay | Source: Home / Self Care | Attending: Gastroenterology

## 2020-05-27 ENCOUNTER — Ambulatory Visit: Payer: BC Managed Care – PPO | Admitting: Anesthesiology

## 2020-05-27 DIAGNOSIS — D125 Benign neoplasm of sigmoid colon: Secondary | ICD-10-CM | POA: Insufficient documentation

## 2020-05-27 DIAGNOSIS — D124 Benign neoplasm of descending colon: Secondary | ICD-10-CM | POA: Insufficient documentation

## 2020-05-27 DIAGNOSIS — K5730 Diverticulosis of large intestine without perforation or abscess without bleeding: Secondary | ICD-10-CM | POA: Diagnosis not present

## 2020-05-27 DIAGNOSIS — K635 Polyp of colon: Secondary | ICD-10-CM

## 2020-05-27 DIAGNOSIS — Z860100 Personal history of colon polyps, unspecified: Secondary | ICD-10-CM

## 2020-05-27 DIAGNOSIS — E785 Hyperlipidemia, unspecified: Secondary | ICD-10-CM | POA: Diagnosis not present

## 2020-05-27 DIAGNOSIS — D120 Benign neoplasm of cecum: Secondary | ICD-10-CM | POA: Insufficient documentation

## 2020-05-27 DIAGNOSIS — Z79899 Other long term (current) drug therapy: Secondary | ICD-10-CM | POA: Insufficient documentation

## 2020-05-27 DIAGNOSIS — Z9049 Acquired absence of other specified parts of digestive tract: Secondary | ICD-10-CM | POA: Diagnosis not present

## 2020-05-27 DIAGNOSIS — Z7982 Long term (current) use of aspirin: Secondary | ICD-10-CM | POA: Insufficient documentation

## 2020-05-27 DIAGNOSIS — Z1211 Encounter for screening for malignant neoplasm of colon: Secondary | ICD-10-CM | POA: Insufficient documentation

## 2020-05-27 DIAGNOSIS — D123 Benign neoplasm of transverse colon: Secondary | ICD-10-CM

## 2020-05-27 HISTORY — DX: Gastro-esophageal reflux disease without esophagitis: K21.9

## 2020-05-27 HISTORY — PX: COLONOSCOPY WITH PROPOFOL: SHX5780

## 2020-05-27 SURGERY — COLONOSCOPY WITH PROPOFOL
Anesthesia: General

## 2020-05-27 MED ORDER — EPHEDRINE 5 MG/ML INJ
INTRAVENOUS | Status: AC
  Filled 2020-05-27: qty 10

## 2020-05-27 MED ORDER — PROPOFOL 500 MG/50ML IV EMUL
INTRAVENOUS | Status: AC
  Filled 2020-05-27: qty 50

## 2020-05-27 MED ORDER — SODIUM CHLORIDE 0.9 % IV SOLN
INTRAVENOUS | Status: DC
Start: 2020-05-27 — End: 2020-05-27

## 2020-05-27 MED ORDER — EPHEDRINE SULFATE 50 MG/ML IJ SOLN
INTRAMUSCULAR | Status: DC | PRN
  Administered 2020-05-27 (×2): 5 mg via INTRAVENOUS

## 2020-05-27 MED ORDER — PROPOFOL 500 MG/50ML IV EMUL
INTRAVENOUS | Status: DC | PRN
  Administered 2020-05-27: 120 ug/kg/min via INTRAVENOUS

## 2020-05-27 NOTE — Anesthesia Procedure Notes (Signed)
Performed by: Cook-Martin, Capitola Ladson Pre-anesthesia Checklist: Patient identified, Emergency Drugs available, Suction available, Patient being monitored and Timeout performed Patient Re-evaluated:Patient Re-evaluated prior to induction Oxygen Delivery Method: Supernova nasal CPAP Preoxygenation: Pre-oxygenation with 100% oxygen Induction Type: IV induction Placement Confirmation: positive ETCO2 and CO2 detector       

## 2020-05-27 NOTE — H&P (Signed)
Casey Antigua, MD [address] [place], [place], [place], [address] [id number], [place], [place], [place], [address] Phone: [phone]  Fax: [phone]  Primary Care Physician:  Doreen Beam, FNP   Pre-Procedure History & Physical: HPI:  Casey Velez is a 55 y.o. male is here for a colonoscopy.   Past Medical History:  Diagnosis Date  . Allergy   . Arthritis   . GERD (gastroesophageal reflux disease)   . Sleep apnea     Past Surgical History:  Procedure Laterality Date  . APPENDECTOMY    . COLONOSCOPY WITH PROPOFOL      Prior to Admission medications   Medication Sig Start Date End Date Taking? Authorizing Provider  aspirin EC 81 MG tablet Take 81 mg by mouth daily. Swallow whole.    [provider]  blood glucose meter kit and supplies Dispense based on patient and insurance preference. Check glucose in the morning fasting and before meals. Use up to four times daily as directed. (FOR ICD-10 E10.9, E11.9). 05/16/20   Flinchum, Kelby Aline, FNP  famotidine (PEPCID) 20 MG tablet Take 1 tablet (20 mg total) by mouth 2 (two) times daily. 05/08/17 05/08/18  Darel Hong, MD  Multiple Vitamin (MULTIVITAMIN WITH MINERALS) TABS tablet Take 1 tablet by mouth daily.    [provider]  polyethylene glycol-electrolytes (NULYTELY) 420 g solution PLEASE SEE ATTACHED FOR DETAILED DIRECTIONS Patient not taking: Reported on 05/16/2020 05/04/20   [provider]  Vitamin D, Ergocalciferol, (DRISDOL) 1.25 MG (50000 UNIT) CAPS capsule Take 1 capsule (50,000 Units total) by mouth every 7 (seven) days. (taking one tablet per week) walk in lab in office 1-2 weeks after completing prescription. 05/16/20   Flinchum, Kelby Aline, FNP    Allergies as of 05/04/2020  . (No Known Allergies)    Family History  Problem Relation Age of Onset  . Diabetes Father   . Glaucoma Father   . Cancer Paternal Grandfather     Social History   Socioeconomic  History  . Marital status: Single    Spouse name: Not on file  . Number of children: Not on file  . Years of education: Not on file  . Highest education level: Not on file  Occupational History  . Not on file  Tobacco Use  . Smoking status: Never Smoker  . Smokeless tobacco: Never Used  Vaping Use  . Vaping Use: Never used  Substance and Sexual Activity  . Alcohol use: Not on file    Comment: rare  . Drug use: Never  . Sexual activity: Not on file  Other Topics Concern  . Not on file  Social History Narrative  . Not on file   Social Determinants of Health   Financial Resource Strain: Not on file  Food Insecurity: Not on file  Transportation Needs: Not on file  Physical Activity: Not on file  Stress: Not on file  Social Connections: Not on file  Intimate Partner Violence: Not on file    Review of Systems: See HPI, otherwise negative ROS  Physical Exam: BP (!) 139/96   Pulse 68   Temp (!) 97.4 F (36.3 C) (Temporal)   Resp 18   Ht _0  (1.676 m)   Wt (!) 142.4 kg   SpO2 97%   BMI 50.68 kg/m  General:   Alert,  pleasant and cooperative in NAD Head:  Normocephalic and atraumatic. Neck:  Supple; no masses or thyromegaly. Lungs:  Clear throughout to auscultation, normal  respiratory effort.    Heart:  +S1, +S2, Regular rate and rhythm, No edema. Abdomen:  Soft, nontender and nondistended. Normal bowel sounds, without guarding, and without rebound.   Neurologic:  Alert and  oriented x4;  grossly normal neurologically.  Impression/Plan: Casey Velez is here for a colonoscopy to be performed for history of adenoma polyps in 2019  Risks, benefits, limitations, and alternatives regarding  colonoscopy have been reviewed with the patient.  Questions have been answered.  All parties agreeable.   Virgel Manifold, MD  05/27/2020, 8:59 AM

## 2020-05-27 NOTE — Transfer of Care (Signed)
Immediate Anesthesia Transfer of Care Note  [name]  Procedure(s) Performed: COLONOSCOPY WITH PROPOFOL (N/A )  Patient Location: PACU  Anesthesia Type:General  Level of Consciousness: awake and sedated  Airway & Oxygen Therapy: Patient Spontanous Breathing and Patient connected to face mask oxygen  Post-op Assessment: Report given to RN and Post -op Vital signs reviewed and stable  Post vital signs: Reviewed and stable  Last Vitals:  Vitals Value Taken Time  BP    Temp    Pulse    Resp    SpO2      Last Pain:  Vitals:   05/27/20 0838  TempSrc: Temporal  PainSc: 0-No pain         Complications: No complications documented.

## 2020-05-27 NOTE — Anesthesia Postprocedure Evaluation (Signed)
Anesthesia Post Note  [name]  Procedure(s) Performed: COLONOSCOPY WITH PROPOFOL (N/A )  Patient location during evaluation: Endoscopy Anesthesia Type: General Level of consciousness: awake and alert Pain management: pain level controlled Vital Signs Assessment: post-procedure vital signs reviewed and stable Respiratory status: spontaneous breathing and respiratory function stable Cardiovascular status: stable Anesthetic complications: no   No complications documented.   Last Vitals:  Vitals:   05/27/20 0949 05/27/20 0959  BP: 99/73 113/80  Pulse: (!) 58 60  Resp: 19 13  Temp:    SpO2: 100% 97%    Last Pain:  Vitals:   05/27/20 0959  TempSrc:   PainSc: 0-No pain                 Maanav Kassabian K

## 2020-05-27 NOTE — Anesthesia Preprocedure Evaluation (Signed)
Anesthesia Evaluation  Patient identified by MRN, date of birth, ID band Patient awake    Reviewed: Allergy & Precautions, NPO status , Patient's Chart, lab work & pertinent test results  History of Anesthesia Complications Negative for: history of anesthetic complications  Airway Mallampati: III       Dental  (+) Loose, Chipped, Poor Dentition   Pulmonary sleep apnea and Continuous Positive Airway Pressure Ventilation , neg COPD, Not current smoker,           Cardiovascular (-) hypertension(-) Past MI and (-) CHF (-) dysrhythmias (-) Valvular Problems/Murmurs     Neuro/Psych neg Seizures    GI/Hepatic Neg liver ROS, neg GERD  ,  Endo/Other  diabetes (diet controlled at present), Type 2Morbid obesity  Renal/GU negative Renal ROS     Musculoskeletal   Abdominal (+) + obese,   Peds  Hematology   Anesthesia Other Findings   Reproductive/Obstetrics                             Anesthesia Physical Anesthesia Plan  ASA: III  Anesthesia Plan: General   Post-op Pain Management:    Induction: Intravenous  PONV Risk Score and Plan: 2 and Propofol infusion and TIVA  Airway Management Planned: Nasal Cannula  Additional Equipment:   Intra-op Plan:   Post-operative Plan:   Informed Consent: I have reviewed the patients History and Physical, chart, labs and discussed the procedure including the risks, benefits and alternatives for the proposed anesthesia with the patient or authorized representative who has indicated his/her understanding and acceptance.       Plan Discussed with:   Anesthesia Plan Comments:         Anesthesia Quick Evaluation

## 2020-05-27 NOTE — Op Note (Signed)
[facility] Patient Name: Casey Velez Procedure Date: 05/27/2020 8:56 AM MRN: [id number] Account #: [id number] Date of Birth: [birth year] Admit Type: Outpatient Age: 55 Room: [facility] ENDO ROOM 1 Gender: Male Note Status: Finalized Procedure:             Colonoscopy Indications:           High risk colon cancer surveillance: Personal history                         of colonic polyps Providers:             Kasra Melvin B. Bonna Gains MD, MD Medicines:             Monitored Anesthesia Care Complications:         No immediate complications. Procedure:             Pre-Anesthesia Assessment:                        - ASA Grade Assessment: II - A patient with mild                         systemic disease.                        - Prior to the procedure, a History and Physical was                         performed, and patient medications, allergies and                         sensitivities were reviewed. The patient's tolerance                         of previous anesthesia was reviewed.                        - The risks and benefits of the procedure and the                         sedation options and risks were discussed with the                         patient. All questions were answered and informed                         consent was obtained.                        - Patient identification and proposed procedure were                         verified prior to the procedure by the physician, the                         nurse, the anesthesiologist, the anesthetist and the                         technician. The procedure was verified in the  procedure room.                        After obtaining informed consent, the colonoscope was                         passed under direct vision. Throughout the procedure,                         the patient's blood pressure, pulse, and oxygen                         saturations were monitored  continuously. The                         Colonoscope was introduced through the anus and                         advanced to the the cecum, identified by appendiceal                         orifice and ileocecal valve. The colonoscopy was                         performed with ease. The patient tolerated the                         procedure well. The quality of the bowel preparation                         was good. Findings:      The perianal and digital rectal examinations were normal.      A 4 mm polyp was found in the cecum. The polyp was sessile. The polyp       was removed with a jumbo cold forceps. Resection and retrieval were       complete.      A 10 mm polyp was found in the transverse colon. The polyp was sessile.       The polyp was removed with a hot snare. Resection and retrieval were       complete.      A 5 mm polyp was found in the transverse colon. The polyp was sessile.       The polyp was removed with a cold snare. Resection and retrieval were       complete.      Two sessile polyps were found in the sigmoid colon and descending colon.       The polyps were 3 to 4 mm in size. These polyps were removed with a       jumbo cold forceps. Resection and retrieval were complete.      Multiple diverticula were found in the sigmoid colon.      The exam was otherwise without abnormality.      The rectum, sigmoid colon, descending colon, transverse colon, ascending       colon and cecum appeared normal.      The retroflexed view of the distal rectum and anal verge was normal and       showed no anal or rectal abnormalities. Impression:            - One 4 mm  polyp in the cecum, removed with a jumbo                         cold forceps. Resected and retrieved.                        - One 10 mm polyp in the transverse colon, removed                         with a hot snare. Resected and retrieved.                        - One 5 mm polyp in the transverse colon, removed with                          a cold snare. Resected and retrieved.                        - Two 3 to 4 mm polyps in the sigmoid colon and in the                         descending colon, removed with a jumbo cold forceps.                         Resected and retrieved.                        - Diverticulosis in the sigmoid colon.                        - The examination was otherwise normal.                        - The rectum, sigmoid colon, descending colon,                         transverse colon, ascending colon and cecum are normal.                        - The distal rectum and anal verge are normal on                         retroflexion view. Recommendation:        - Discharge patient to home (with escort).                        - High fiber diet.                        - Advance diet as tolerated.                        - Continue present medications.                        - Await pathology results.                        - Repeat colonoscopy date to be determined after  pending pathology results are reviewed.                        - The findings and recommendations were discussed with                         the patient.                        - The findings and recommendations were discussed with                         the patient's family.                        - Return to primary care physician as previously                         scheduled. Procedure Code(s):     --- Professional ---                        [phone], Colonoscopy, flexible; with removal of                         tumor(s), polyp(s), or other lesion(s) by snare                         technique                        45380, 38, Colonoscopy, flexible; with biopsy, single                         or multiple Diagnosis Code(s):     --- Professional ---                        K63.5, Polyp of colon                        Z86.010, Personal history of colonic polyps CPT copyright 2019 American  Medical Association. All rights reserved. The codes documented in this report are preliminary and upon coder review may  be revised to meet current compliance requirements.  Vonda Antigua, MD Margretta Sidle B. Bonna Gains MD, MD 05/27/2020 9:40:47 AM This report has been signed electronically. Number of Addenda: 0 Note Initiated On: 05/27/2020 8:56 AM Scope Withdrawal Time: 0 hours 24 minutes 2 seconds  Total Procedure Duration: 0 hours 25 minutes 49 seconds  Estimated Blood Loss:  Estimated blood loss: none.      [facility]

## 2020-05-30 ENCOUNTER — Encounter: Payer: Self-pay | Admitting: Gastroenterology

## 2020-05-30 LAB — SURGICAL PATHOLOGY

## 2020-06-01 ENCOUNTER — Encounter: Payer: Self-pay | Admitting: Gastroenterology

## 2020-06-01 NOTE — Progress Notes (Signed)
The polyps removed from your colon were benign, but precancerous. This means that they had the potential to change into cancer over time had they not been removed.   I recommend you have a repeat colonoscopy in 3 years to determine if you have developed any new polyps and to screen for colorectal cancer.    If you develop any new rectal bleeding, abdominal pain or significant bowel habit changes, please contact our office at [phone].   Please call us if you have persistent problems or have questions about your condition that have not been fully answered at this time.   Sincerely,   Virgel Manifold, MD

## 2020-07-15 ENCOUNTER — Emergency Department
Admission: EM | Admit: 2020-07-15 | Discharge: 2020-07-15 | Disposition: A | Discharge status: Home / Self Care | Payer: BC Managed Care – PPO | Attending: Emergency Medicine | Admitting: Emergency Medicine

## 2020-07-15 ENCOUNTER — Other Ambulatory Visit: Payer: Self-pay

## 2020-07-15 ENCOUNTER — Emergency Department: Payer: BC Managed Care – PPO

## 2020-07-15 DIAGNOSIS — R339 Retention of urine, unspecified: Secondary | ICD-10-CM | POA: Diagnosis not present

## 2020-07-15 DIAGNOSIS — R3915 Urgency of urination: Secondary | ICD-10-CM | POA: Diagnosis not present

## 2020-07-15 DIAGNOSIS — R109 Unspecified abdominal pain: Secondary | ICD-10-CM | POA: Insufficient documentation

## 2020-07-15 DIAGNOSIS — M545 Low back pain, unspecified: Secondary | ICD-10-CM | POA: Diagnosis not present

## 2020-07-15 DIAGNOSIS — R140 Abdominal distension (gaseous): Secondary | ICD-10-CM | POA: Diagnosis not present

## 2020-07-15 DIAGNOSIS — Z7982 Long term (current) use of aspirin: Secondary | ICD-10-CM | POA: Diagnosis not present

## 2020-07-15 DIAGNOSIS — E119 Type 2 diabetes mellitus without complications: Secondary | ICD-10-CM | POA: Diagnosis not present

## 2020-07-15 LAB — URINALYSIS, COMPLETE (UACMP) WITH MICROSCOPIC
Bacteria, UA: NONE SEEN
Bilirubin Urine: NEGATIVE
Glucose, UA: NEGATIVE mg/dL
Hgb urine dipstick: NEGATIVE
Ketones, ur: NEGATIVE mg/dL
Leukocytes,Ua: NEGATIVE
Nitrite: NEGATIVE
Protein, ur: 30 mg/dL — AB
Specific Gravity, Urine: 1.031 — ABNORMAL HIGH (ref 1.005–1.030)
pH: 5 (ref 5.0–8.0)

## 2020-07-15 LAB — COMPREHENSIVE METABOLIC PANEL
ALT: 50 U/L — ABNORMAL HIGH (ref 0–44)
AST: 59 U/L — ABNORMAL HIGH (ref 15–41)
Albumin: 4 g/dL (ref 3.5–5.0)
Alkaline Phosphatase: 75 U/L (ref 38–126)
Anion gap: 9 (ref 5–15)
BUN: 19 mg/dL (ref 6–20)
CO2: 22 mmol/L (ref 22–32)
Calcium: 9.2 mg/dL (ref 8.9–10.3)
Chloride: 106 mmol/L (ref 98–111)
Creatinine, Ser: 1.19 mg/dL (ref 0.61–1.24)
GFR, Estimated: >60 mL/min (ref >60)
Glucose, Bld: 166 mg/dL — ABNORMAL HIGH (ref 70–99)
Potassium: 4 mmol/L (ref 3.5–5.1)
Sodium: 137 mmol/L (ref 135–145)
Total Bilirubin: 0.6 mg/dL (ref 0.3–1.2)
Total Protein: 8.3 g/dL — ABNORMAL HIGH (ref 6.5–8.1)

## 2020-07-15 LAB — CBC
HCT: 45.8 % (ref 39.0–52.0)
Hemoglobin: 15.4 g/dL (ref 13.0–17.0)
MCH: 30.4 pg (ref 26.0–34.0)
MCHC: 33.6 g/dL (ref 30.0–36.0)
MCV: 90.3 fL (ref 80.0–100.0)
Platelets: 273 10*3/uL (ref 150–400)
RBC: 5.07 MIL/uL (ref 4.22–5.81)
RDW: 13.2 % (ref 11.5–15.5)
WBC: 9.8 10*3/uL (ref 4.0–10.5)
nRBC: 0 % (ref 0.0–0.2)

## 2020-07-15 MED ORDER — KETOROLAC TROMETHAMINE 60 MG/2ML IM SOLN
60.0000 mg | Freq: Once | INTRAMUSCULAR | Status: AC
  Administered 2020-07-15: 60 mg via INTRAMUSCULAR
  Filled 2020-07-15: qty 2

## 2020-07-15 NOTE — ED Notes (Signed)
Pt called in the [place] with no response, pt is not visualized at this time

## 2020-07-15 NOTE — Discharge Instructions (Signed)
No acute findings on ultrasound of your bladder today.  Follow-up with urology for definitive evaluation and treatment.  Call today and tell them you a follow-up from the emergency room

## 2020-07-15 NOTE — ED Triage Notes (Addendum)
Pt in with co low back pain and decreased urination. Pt denies any hx of the same. Pt has not had a fever. Pt states has urinary urgency but voids small amounts each time. Denies any abd pain or distention but has low back pain.

## 2020-07-15 NOTE — ED Provider Notes (Signed)
[facility] Emergency Department Provider Note   ____________________________________________   Event Date/Time   First MD Initiated Contact with Patient 07/15/20 [phone]     (approximate)  I have reviewed the triage vital signs and the nursing notes.   HISTORY  Chief Complaint Back Pain    HPI Casey Velez is a 55 y.o. male patient complain of low back pain and abdominal distention with decreased urination.  Patient onset of complaint last night.  Patient states urinary urgency with only small of voiding..  Initially no abdominal pain but that has increased since arrival to the ED.  Rates his pain/discomfort a 7/10.  Described the pain as "achy.  No palliative measure for complaint.         Past Medical History:  Diagnosis Date   Allergy    Arthritis    GERD (gastroesophageal reflux disease)    Sleep apnea     Patient Active Problem List   Diagnosis Date Noted   Personal history of colonic polyps    Cecal polyp    Polyp of descending colon    Polyp of sigmoid colon    Adenomatous polyp of transverse colon    Hyperlipidemia 05/23/2020   Diabetes mellitus without complication ([place]) 21/19/4174   Elevated serum GGT level 05/23/2020   Vitamin D deficiency 05/23/2020   Screening for malignant neoplasm of prostate 04/15/2020   Fatigue 04/15/2020   Body mass index (BMI) of 50-59.9 in adult ([place]) 04/15/2020   Single episode of elevated blood pressure 04/15/2020    Past Surgical History:  Procedure Laterality Date   APPENDECTOMY     COLONOSCOPY WITH PROPOFOL     COLONOSCOPY WITH PROPOFOL N/A 05/27/2020   Procedure: COLONOSCOPY WITH PROPOFOL;  Surgeon: Virgel Manifold, MD;  Location: [facility] ENDOSCOPY;  Service: Endoscopy;  Laterality: N/A;    Prior to Admission medications   Medication Sig Start Date End Date Taking? Authorizing Provider  aspirin EC 81 MG tablet Take 81 mg by mouth daily. Swallow whole.    [provider]  blood  glucose meter kit and supplies Dispense based on patient and insurance preference. Check glucose in the morning fasting and before meals. Use up to four times daily as directed. (FOR ICD-10 E10.9, E11.9). 05/16/20   Flinchum, Kelby Aline, FNP  famotidine (PEPCID) 20 MG tablet Take 1 tablet (20 mg total) by mouth 2 (two) times daily. 05/08/17 05/08/18  Darel Hong, MD  Multiple Vitamin (MULTIVITAMIN WITH MINERALS) TABS tablet Take 1 tablet by mouth daily.    [provider]  polyethylene glycol-electrolytes (NULYTELY) 420 g solution PLEASE SEE ATTACHED FOR DETAILED DIRECTIONS Patient not taking: Reported on 05/16/2020 05/04/20   [provider]  Vitamin D, Ergocalciferol, (DRISDOL) 1.25 MG (50000 UNIT) CAPS capsule Take 1 capsule (50,000 Units total) by mouth every 7 (seven) days. (taking one tablet per week) walk in lab in office 1-2 weeks after completing prescription. 05/16/20   Flinchum, Kelby Aline, FNP    Allergies Patient has no known allergies.  Family History  Problem Relation Age of Onset   Diabetes Father    Glaucoma Father    Cancer Paternal Grandfather     Social History Social History   Tobacco Use   Smoking status: Never   Smokeless tobacco: Never  Vaping Use   Vaping Use: Never used  Substance Use Topics   Drug use: Never    Review of Systems Constitutional: No fever/chills Eyes: No visual changes. ENT: No sore throat.  Cardiovascular: Denies chest pain. Respiratory: Denies shortness of breath. Gastrointestinal: No abdominal pain.  No nausea, no vomiting.  No diarrhea.  No constipation. Genitourinary: Positive for urinary urgency and frequency.   Musculoskeletal: Negative for back pain. Skin: Negative for rash. Neurological: Negative for headaches, focal weakness or numbness. Endocrine: Diabetes and hyperlipidemia.  ____________________________________________   PHYSICAL EXAM:  VITAL SIGNS: ED Triage Vitals  Enc Vitals Group     BP 07/15/20  0523 (!) 161/120     Pulse Rate 07/15/20 0523 72     Resp 07/15/20 0523 20     Temp 07/15/20 0523 97.9 F (36.6 C)     Temp Source 07/15/20 0523 Oral     SpO2 07/15/20 0524 99 %     Weight 07/15/20 0526 (!) 320 lb (145.2 kg)     Height 07/15/20 0526 6' (1.829 m)     Head Circumference --      Peak Flow --      Pain Score 07/15/20 0524 7     Pain Loc --      Pain Edu? --      Excl. in [place]? --     Constitutional: Alert and oriented.  Moderate distress.   Eyes: Conjunctivae are normal. PERRL. EOMI. Head: Atraumatic. Nose: No congestion/rhinnorhea. Mouth/Throat: Mucous membranes are moist.  Oropharynx non-erythematous. Cardiovascular: Normal rate, regular rhythm. Grossly normal heart sounds.  Good peripheral circulation.  Elevated blood pressure Respiratory: Normal respiratory effort.  No retractions. Lungs CTAB. Gastrointestinal: Soft and nontender.  Distention secondary to body habitus.. No abdominal bruits. No CVA tenderness. Genitourinary: Deferred Musculoskeletal: No lower extremity tenderness nor edema.  No joint effusions. Neurologic:  Normal speech and language. No gross focal neurologic deficits are appreciated. No gait instability. Skin:  Skin is warm, dry and intact. No rash noted. Psychiatric: Mood and affect are normal. Speech and behavior are normal.  ____________________________________________   LABS (all labs ordered are listed, but only abnormal results are displayed)  Labs Reviewed  COMPREHENSIVE METABOLIC PANEL - Abnormal; Notable for the following components:      Result Value   Glucose, Bld 166 (*)    Total Protein 8.3 (*)    AST 59 (*)    ALT 50 (*)    All other components within normal limits  URINALYSIS, COMPLETE (UACMP) WITH MICROSCOPIC - Abnormal; Notable for the following components:   Color, Urine YELLOW (*)    APPearance HAZY (*)    Specific Gravity, Urine 1.031 (*)    Protein, ur 30 (*)    All other components within normal limits  CBC    ____________________________________________  EKG   ____________________________________________  RADIOLOGY I, Sable Feil, personally viewed and evaluated these images (plain radiographs) as part of my medical decision making, as well as reviewing the written report by the radiologist.  ED MD interpretation:    Official radiology report(s): US Pelvis Limited  Result Date: 07/15/2020 CLINICAL DATA:  Urinary tension EXAM: LIMITED ULTRASOUND OF PELVIS TECHNIQUE: Limited transabdominal ultrasound examination of the pelvis was performed to assess the urinary bladder. COMPARISON:  CT abdomen and pelvis 03/24/2005 FINDINGS: Normally distended urinary bladder. No bladder wall thickening or mass. Pre-void volume: 112 mL Post-void volume: 0 mL Other findings:  N/A IMPRESSION: Unremarkable sonographic appearance of the urinary bladder. Electronically Signed   By: Lavonia Dana M.D.   On: 07/15/2020 11:44    ____________________________________________   PROCEDURES  Procedure(s) performed (including Critical Care):  Procedures   ____________________________________________   INITIAL IMPRESSION /  ASSESSMENT AND PLAN / ED COURSE  As part of my medical decision making, I reviewed the following data within the [place]         Patient presents with urinary urgency and frequency.  Patient states there was increased abdominal pain after arrival to the emergency room.  Discussed with bladder scan showing no retention.  Patient had ultrasound showing no bladder distention.  Patient complaint and physical exam consistent with urinary urgency of a questionable secondary to BPH.  Patient will follow with urology for definitive evaluation and treatment.      ____________________________________________   FINAL CLINICAL IMPRESSION(S) / ED DIAGNOSES  Final diagnoses:  Urinary urgency     ED Discharge Orders     None        Note:  This document was prepared  using Dragon voice recognition software and may include unintentional dictation errors.    Sable Feil, PA-C 07/15/20 1218    Lucrezia Starch, MD 07/17/20 [phone]

## 2020-07-15 NOTE — ED Notes (Signed)
See triage note  Presents with lower back pain and some urinary problems  States he only voids small amts  No fever

## 2020-07-15 NOTE — ED Notes (Signed)
Bladder scan multiple times 25 ml , 62ml,22ml, and 20 ml

## 2020-07-21 ENCOUNTER — Ambulatory Visit: Payer: BC Managed Care – PPO | Admitting: Urology

## 2020-07-22 ENCOUNTER — Other Ambulatory Visit: Payer: Self-pay

## 2020-07-22 ENCOUNTER — Encounter: Payer: Self-pay | Admitting: Urology

## 2020-07-22 ENCOUNTER — Ambulatory Visit (INDEPENDENT_AMBULATORY_CARE_PROVIDER_SITE_OTHER): Payer: BC Managed Care – PPO | Admitting: Urology

## 2020-07-22 VITALS — BP 138/75 | HR 65 | Ht 72.0 in | Wt 320.0 lb

## 2020-07-22 DIAGNOSIS — R3915 Urgency of urination: Secondary | ICD-10-CM | POA: Diagnosis not present

## 2020-07-22 LAB — BLADDER SCAN AMB NON-IMAGING: Scan Result: 0

## 2020-07-22 LAB — URINALYSIS, COMPLETE
Bilirubin, UA: NEGATIVE
Glucose, UA: NEGATIVE
Ketones, UA: NEGATIVE
Leukocytes,UA: NEGATIVE
Nitrite, UA: NEGATIVE
Protein,UA: NEGATIVE
RBC, UA: NEGATIVE
Specific Gravity, UA: 1.025 (ref 1.005–1.030)
Urobilinogen, Ur: 0.2 mg/dL (ref 0.2–1.0)
pH, UA: 5 (ref 5.0–7.5)

## 2020-07-22 LAB — MICROSCOPIC EXAMINATION
Bacteria, UA: NONE SEEN
Epithelial Cells (non renal): NONE SEEN /HPF (ref 0–10)

## 2020-07-22 NOTE — Progress Notes (Signed)
07/22/2020 8:58 AM   [name] 08/14/1965 [id number]  Referring provider: Doreen Beam, FNP [address],  [place] [address]  Chief Complaint  Patient presents with   Urinary Frequency    HPI: Casey Velez is a 55 y.o. male who presents for follow-up of recent ED visit.  Seen in ED 07/15/2020 for left low back pain with abdominal distention, decreased urine output and urinary urgency Limited pelvic ultrasound was performed which showed a bladder volume of 112 mL and a postvoid volume of 0 mL Urinalysis was unremarkable He states his symptoms resolved that day and he has had no recurrent problems since that time No prior history of stone disease   PMH: Past Medical History:  Diagnosis Date   Allergy    Arthritis    GERD (gastroesophageal reflux disease)    Sleep apnea     Surgical History: Past Surgical History:  Procedure Laterality Date   APPENDECTOMY     COLONOSCOPY WITH PROPOFOL     COLONOSCOPY WITH PROPOFOL N/A 05/27/2020   Procedure: COLONOSCOPY WITH PROPOFOL;  Surgeon: Virgel Manifold, MD;  Location: [facility] ENDOSCOPY;  Service: Endoscopy;  Laterality: N/A;    Home Medications:  Allergies as of 07/22/2020   No Known Allergies      Medication List        Accurate as of July 22, 2020  8:58 AM. If you have any questions, ask your nurse or doctor.          aspirin EC 81 MG tablet Take 81 mg by mouth daily. Swallow whole.   blood glucose meter kit and supplies Dispense based on patient and insurance preference. Check glucose in the morning fasting and before meals. Use up to four times daily as directed. (FOR ICD-10 E10.9, E11.9).   famotidine 20 MG tablet Commonly known as: PEPCID Take 1 tablet (20 mg total) by mouth 2 (two) times daily.   multivitamin with minerals Tabs tablet Take 1 tablet by mouth daily.   polyethylene glycol-electrolytes 420 g solution Commonly known as: NuLYTELY PLEASE SEE ATTACHED FOR  DETAILED DIRECTIONS   Vitamin D (Ergocalciferol) 1.25 MG (50000 UNIT) Caps capsule Commonly known as: DRISDOL Take 1 capsule (50,000 Units total) by mouth every 7 (seven) days. (taking one tablet per week) walk in lab in office 1-2 weeks after completing prescription.        Allergies: No Known Allergies  Family History: Family History  Problem Relation Age of Onset   Diabetes Father    Glaucoma Father    Cancer Paternal Grandfather     Social History:  reports that he has never smoked. He has never used smokeless tobacco. He reports that he does not use drugs. No history on file for alcohol use.   Physical Exam: BP 138/75   Pulse 65   Ht 6' (1.829 m)   Wt (!) 320 lb (145.2 kg)   BMI 43.40 kg/m   Constitutional:  Alert and oriented, No acute distress. HEENT: [place] AT, moist mucus membranes.  Trachea midline, no masses. Cardiovascular: No clubbing, cyanosis, or edema. Respiratory: Normal respiratory effort, no increased work of breathing.   Assessment & Plan:   55 y.o. male with a transient episode of urgency and left low back pain.  There was no evidence of urinary retention and urinalysis was unremarkable.  Bladder scan PVR today is 0 mL. Recommend he return as needed for recurrent symptoms   Abbie Sons, MD  [place]  [address], [place], [place] [address] [phone]

## 2020-08-01 ENCOUNTER — Other Ambulatory Visit: Payer: Self-pay | Admitting: Adult Health

## 2020-08-01 DIAGNOSIS — E559 Vitamin D deficiency, unspecified: Secondary | ICD-10-CM

## 2020-08-15 ENCOUNTER — Ambulatory Visit: Payer: BC Managed Care – PPO | Admitting: Adult Health

## 2020-10-06 ENCOUNTER — Encounter: Payer: Self-pay | Admitting: Adult Health

## 2020-10-12 ENCOUNTER — Ambulatory Visit: Payer: BC Managed Care – PPO | Admitting: Adult Health

## 2020-10-28 ENCOUNTER — Telehealth: Payer: Self-pay | Admitting: Adult Health

## 2020-10-28 NOTE — Telephone Encounter (Signed)
Patient was scheduled for labs before his appointment with Sharyn Lull on 10/12 and she is out on medical leave but he is unsure if he will still need to have labs done.Please advise.

## 2020-10-31 ENCOUNTER — Telehealth: Payer: Self-pay | Admitting: *Deleted

## 2020-10-31 NOTE — Telephone Encounter (Signed)
Called pt to let him know he can still get his labs as scheduled. LMTCB

## 2020-10-31 NOTE — Telephone Encounter (Signed)
Please place future orders for lab appt.  

## 2020-11-01 ENCOUNTER — Other Ambulatory Visit: Payer: BC Managed Care – PPO

## 2020-11-01 ENCOUNTER — Other Ambulatory Visit: Payer: Self-pay | Admitting: Family

## 2020-11-01 ENCOUNTER — Telehealth: Payer: Self-pay

## 2020-11-01 DIAGNOSIS — E119 Type 2 diabetes mellitus without complications: Secondary | ICD-10-CM

## 2020-11-01 DIAGNOSIS — E785 Hyperlipidemia, unspecified: Secondary | ICD-10-CM

## 2020-11-01 DIAGNOSIS — E7800 Pure hypercholesterolemia, unspecified: Secondary | ICD-10-CM

## 2020-11-01 DIAGNOSIS — R739 Hyperglycemia, unspecified: Secondary | ICD-10-CM

## 2020-11-01 DIAGNOSIS — E559 Vitamin D deficiency, unspecified: Secondary | ICD-10-CM

## 2020-11-01 NOTE — Telephone Encounter (Signed)
Orders placed for fasting labs.

## 2020-11-03 ENCOUNTER — Ambulatory Visit: Payer: BC Managed Care – PPO | Admitting: Adult Health

## 2020-11-16 ENCOUNTER — Ambulatory Visit: Payer: BC Managed Care – PPO | Admitting: Adult Health

## 2020-12-21 ENCOUNTER — Ambulatory Visit: Payer: BC Managed Care – PPO | Admitting: Internal Medicine

## 2021-01-04 ENCOUNTER — Ambulatory Visit (INDEPENDENT_AMBULATORY_CARE_PROVIDER_SITE_OTHER): Payer: BC Managed Care – PPO | Admitting: Internal Medicine

## 2021-01-04 ENCOUNTER — Encounter: Payer: Self-pay | Admitting: Internal Medicine

## 2021-01-04 ENCOUNTER — Other Ambulatory Visit: Payer: Self-pay

## 2021-01-04 VITALS — BP 139/87 | HR 82 | Temp 98.2°F | Ht 66.0 in | Wt 322.5 lb

## 2021-01-04 DIAGNOSIS — E785 Hyperlipidemia, unspecified: Secondary | ICD-10-CM | POA: Insufficient documentation

## 2021-01-04 DIAGNOSIS — Z23 Encounter for immunization: Secondary | ICD-10-CM | POA: Diagnosis not present

## 2021-01-04 DIAGNOSIS — E1169 Type 2 diabetes mellitus with other specified complication: Secondary | ICD-10-CM | POA: Diagnosis not present

## 2021-01-04 DIAGNOSIS — E119 Type 2 diabetes mellitus without complications: Secondary | ICD-10-CM | POA: Diagnosis not present

## 2021-01-04 LAB — BAYER DCA HB A1C WAIVED: HB A1C (BAYER DCA - WAIVED): 6.5 % — ABNORMAL HIGH (ref 4.8–5.6)

## 2021-01-04 MED ORDER — OZEMPIC (0.25 OR 0.5 MG/DOSE) 2 MG/1.5ML ~~LOC~~ SOPN: 0.5000 mg | PEN_INJECTOR | SUBCUTANEOUS | 5 refills | Status: DC

## 2021-01-04 MED ORDER — METFORMIN HCL 500 MG PO TABS: 500.0000 mg | ORAL_TABLET | Freq: Two times a day (BID) | ORAL | 3 refills | Status: DC

## 2021-01-04 NOTE — Progress Notes (Signed)
BP 139/87   Pulse 82   Temp 98.2 F (36.8 C) (Oral)   Ht 5' 6"  (1.676 m)   Wt (!) 322 lb 8 oz (146.3 kg)   SpO2 98%   BMI 52.05 kg/m    Subjective:    Patient ID: Casey Velez, male    DOB: 02/08/1965, 55 y.o.   MRN: [id number]  Chief Complaint  Patient presents with   Establish Care   Hyperlipidemia   Diabetes    Patient here to establish care. Has questions about weightloss.    HPI: Casey Velez is a 55 y.o. male  Pt is here to establish care, he has a ho prediabetes ? A1c at 7.0 in march was not started on meds. Pt has noted high fsbs at 166 has had highs at 200 x 1-2 about a month, pt says he has had testicular   Hyperlipidemia This is a chronic problem.  Diabetes He presents for his initial (a1c high with high at 166) diabetic visit. He has type 2 diabetes mellitus.   Chief Complaint  Patient presents with   Establish Care   Hyperlipidemia   Diabetes    Patient here to establish care. Has questions about weightloss.    Relevant past medical, surgical, family and social history reviewed and updated as indicated. Interim medical history since our last visit reviewed. Allergies and medications reviewed and updated.  Review of Systems  Per HPI unless specifically indicated above     Objective:    BP 139/87   Pulse 82   Temp 98.2 F (36.8 C) (Oral)   Ht 5' 6"  (1.676 m)   Wt (!) 322 lb 8 oz (146.3 kg)   SpO2 98%   BMI 52.05 kg/m   Wt Readings from Last 3 Encounters:  01/04/21 (!) 322 lb 8 oz (146.3 kg)  07/22/20 (!) 320 lb (145.2 kg)  07/15/20 (!) 320 lb (145.2 kg)    Physical Exam  Results for orders placed or performed in visit on 07/22/20  Microscopic Examination   Urine  Result Value Ref Range   WBC, UA 0-5 0 - 5 /hpf   RBC 0-2 0 - 2 /hpf   Epithelial Cells (non renal) None seen 0 - 10 /hpf   Bacteria, UA None seen None seen/Few  Urinalysis, Complete  Result Value Ref Range   Specific Gravity, UA 1.025 1.005 - 1.030   pH, UA 5.0  5.0 - 7.5   Color, UA Yellow Yellow   Appearance Ur Clear Clear   Leukocytes,UA Negative Negative   Protein,UA Negative Negative/Trace   Glucose, UA Negative Negative   Ketones, UA Negative Negative   RBC, UA Negative Negative   Bilirubin, UA Negative Negative   Urobilinogen, Ur 0.2 0.2 - 1.0 mg/dL   Nitrite, UA Negative Negative   Microscopic Examination See below:   Bladder Scan (Post Void Residual) in office  Result Value Ref Range   Scan Result 0         Current Outpatient Medications:    aspirin EC 81 MG tablet, Take 81 mg by mouth daily. Swallow whole., Disp: , Rfl:    blood glucose meter kit and supplies, Dispense based on patient and insurance preference. Check glucose in the morning fasting and before meals. Use up to four times daily as directed. (FOR ICD-10 E10.9, E11.9)., Disp: 1 each, Rfl: 0   Multiple Vitamin (MULTIVITAMIN WITH MINERALS) TABS tablet, Take 1 tablet by mouth daily., Disp: , Rfl:  Vitamin D, Ergocalciferol, (DRISDOL) 1.25 MG (50000 UNIT) CAPS capsule, Take 1 capsule (50,000 Units total) by mouth every 7 (seven) days. (taking one tablet per week) walk in lab in office 1-2 weeks after completing prescription., Disp: 12 capsule, Rfl: 0   famotidine (PEPCID) 20 MG tablet, Take 1 tablet (20 mg total) by mouth 2 (two) times daily., Disp: 60 tablet, Rfl: 0   polyethylene glycol-electrolytes (NULYTELY) 420 g solution, PLEASE SEE ATTACHED FOR DETAILED DIRECTIONS (Patient not taking: Reported on 05/16/2020), Disp: , Rfl:     Assessment & Plan:  DM : A1c at 6.5 will start pt on ozempic 322 lbs  1st dose administered in office  Pt to take 0.25 mg q weekly x 2 weeks then increase to 0.5 mg q weekly and stay on this.  check HbA1c,  urine  microalbumin  diabetic diet plan given to pt  adviced regarding hypoglycemia and instructions given to pt today on how to prevent and treat the same if it were to occur. pt acknowledges the plan and voices understanding of the same.   exercise plan given and encouraged.   advice diabetic yearly podiatry, ophthalmology , nutritionist , dental check q 6 months,   2. HLD start pt on lipitor.  recheck FLP, check LFT's work on diet, SE of meds explained to pt. low fat and high fiber diet explained to pt.   Latest Reference Range & Units 04/26/20 08:06  Total CHOL/HDL Ratio 0.0 - 5.0 ratio 5.7 (H)  Cholesterol, Total 100 - 199 mg/dL 200 (H)  HDL Cholesterol >39 mg/dL 35 (L)  Triglycerides 0 - 149 mg/dL 152 (H)  VLDL Cholesterol Cal 5 - 40 mg/dL 28  LDL Chol Calc (NIH) 0 - 99 mg/dL 137 (H)  (H): Data is abnormally high (L): Data is abnormally low   4. Myles Rosenthal will start pt on ozempic  Body mass index is 52.05 kg/m.   Problem List Items Addressed This Visit   None    Orders Placed This Encounter  Procedures   Flu Vaccine QUAD 14moIM (Fluarix, Fluzone & Alfiuria Quad PF)   Comprehensive metabolic panel   CBC with Differential/Platelet   Microalbumin, Urine Waived   Bayer DCA Hb A1c Waived (STAT)   Comprehensive metabolic panel   Lipid panel   Urinalysis, Routine w reflex microscopic   Microalbumin, Urine Waived     Meds ordered this encounter  Medications   DISCONTD: metFORMIN (GLUCOPHAGE) 500 MG tablet    Sig: Take 1 tablet (500 mg total) by mouth 2 (two) times daily with a meal.    Dispense:  60 tablet    Refill:  3   Semaglutide,0.25 or 0.5MG/DOS, (OZEMPIC, 0.25 OR 0.5 MG/DOSE,) 2 MG/1.5ML SOPN    Sig: Inject 0.5 mg into the skin once a week.    Dispense:  2 mL    Refill:  5     Follow up plan: No follow-ups on file.

## 2021-01-04 NOTE — Patient Instructions (Addendum)
Calorie Counting for Weight Loss Calories are units of energy. Your body needs a certain number of calories from food to keep going throughout the day. When you eat or drink more calories than your body needs, your body stores the extra calories mostly as fat. When you eat or drink fewer calories than your body needs, your body burns fat to get the energy it needs. Calorie counting means keeping track of how many calories you eat and drink each day. Calorie counting can be helpful if you need to lose weight. If you eat fewer calories than your body needs, you should lose weight. Ask your health care provider what a healthy weight is for you. For calorie counting to work, you will need to eat the right number of calories each day to lose a healthy amount of weight per week. A dietitian can help you figure out how many calories you need in a day and will suggest ways to reach your calorie goal. A healthy amount of weight to lose each week is usually 1-2 lb (0.5-0.9 kg). This usually means that your daily calorie intake should be reduced by 500-750 calories. Eating 1,200-1,500 calories a day can help most women lose weight. Eating 1,500-1,800 calories a day can help most men lose weight. What do I need to know about calorie counting? Work with your health care provider or dietitian to determine how many calories you should get each day. To meet your daily calorie goal, you will need to: Find out how many calories are in each food that you would like to eat. Try to do this before you eat. Decide how much of the food you plan to eat. Keep a food log. Do this by writing down what you ate and how many calories it had. To successfully lose weight, it is important to balance calorie counting with a healthy lifestyle that includes regular activity. Where do I find calorie information? The number of calories in a food can be found on a Nutrition Facts label. If a food does not have a Nutrition Facts label, try to  look up the calories online or ask your dietitian for help. Remember that calories are listed per serving. If you choose to have more than one serving of a food, you will have to multiply the calories per serving by the number of servings you plan to eat. For example, the label on a package of bread might say that a serving size is 1 slice and that there are 90 calories in a serving. If you eat 1 slice, you will have eaten 90 calories. If you eat 2 slices, you will have eaten 180 calories. How do I keep a food log? After each time that you eat, record the following in your food log as soon as possible: What you ate. Be sure to include toppings, sauces, and other extras on the food. How much you ate. This can be measured in cups, ounces, or number of items. How many calories were in each food and drink. The total number of calories in the food you ate. Keep your food log near you, such as in a pocket-sized notebook or on an app or website on your mobile phone. Some programs will calculate calories for you and show you how many calories you have left to meet your daily goal. What are some portion-control tips? Know how many calories are in a serving. This will help you know how many servings you can have of a certain food.  Use a measuring cup to measure serving sizes. You could also try weighing out portions on a kitchen scale. With time, you will be able to estimate serving sizes for some foods. Take time to put servings of different foods on your favorite plates or in your favorite bowls and cups so you know what a serving looks like. Try not to eat straight from a food's packaging, such as from a bag or box. Eating straight from the package makes it hard to see how much you are eating and can lead to overeating. Put the amount you would like to eat in a cup or on a plate to make sure you are eating the right portion. Use smaller plates, glasses, and bowls for smaller portions and to prevent  overeating. Try not to multitask. For example, avoid watching TV or using your computer while eating. If it is time to eat, sit down at a table and enjoy your food. This will help you recognize when you are full. It will also help you be more mindful of what and how much you are eating. What are tips for following this plan? Reading food labels Check the calorie count compared with the serving size. The serving size may be smaller than what you are used to eating. Check the source of the calories. Try to choose foods that are high in protein, fiber, and vitamins, and low in saturated fat, trans fat, and sodium. Shopping Read nutrition labels while you shop. This will help you make healthy decisions about which foods to buy. Pay attention to nutrition labels for low-fat or fat-free foods. These foods sometimes have the same number of calories or more calories than the full-fat versions. They also often have added sugar, starch, or salt to make up for flavor that was removed with the fat. Make a grocery list of lower-calorie foods and stick to it. Cooking Try to cook your favorite foods in a healthier way. For example, try baking instead of frying. Use low-fat dairy products. Meal planning Use more fruits and vegetables. One-half of your plate should be fruits and vegetables. Include lean proteins, such as chicken, [place], and fish. Lifestyle Each week, aim to do one of the following: 150 minutes of moderate exercise, such as walking. 75 minutes of vigorous exercise, such as running. General information Know how many calories are in the foods you eat most often. This will help you calculate calorie counts faster. Find a way of tracking calories that works for you. Get creative. Try different apps or programs if writing down calories does not work for you. What foods should I eat?  Eat nutritious foods. It is better to have a nutritious, high-calorie food, such as an avocado, than a food with  few nutrients, such as a bag of potato chips. Use your calories on foods and drinks that will fill you up and will not leave you hungry soon after eating. Examples of foods that fill you up are nuts and nut butters, vegetables, lean proteins, and high-fiber foods such as whole grains. High-fiber foods are foods with more than 5 g of fiber per serving. Pay attention to calories in drinks. Low-calorie drinks include water and unsweetened drinks. The items listed above may not be a complete list of foods and beverages you can eat. Contact a dietitian for more information. What foods should I limit? Limit foods or drinks that are not good sources of vitamins, minerals, or protein or that are high in unhealthy fats. These include:  Candy. Other sweets. Sodas, specialty coffee drinks, alcohol, and juice. The items listed above may not be a complete list of foods and beverages you should avoid. Contact a dietitian for more information. How do I count calories when eating out? Pay attention to portions. Often, portions are much larger when eating out. Try these tips to keep portions smaller: Consider sharing a meal instead of getting your own. If you get your own meal, eat only half of it. Before you start eating, ask for a container and put half of your meal into it. When available, consider ordering smaller portions from the menu instead of full portions. Pay attention to your food and drink choices. Knowing the way food is cooked and what is included with the meal can help you eat fewer calories. If calories are listed on the menu, choose the lower-calorie options. Choose dishes that include vegetables, fruits, whole grains, low-fat dairy products, and lean proteins. Choose items that are boiled, broiled, grilled, or steamed. Avoid items that are buttered, battered, fried, or served with cream sauce. Items labeled as crispy are usually fried, unless stated otherwise. Choose water, low-fat milk,  unsweetened iced tea, or other drinks without added sugar. If you want an alcoholic beverage, choose a lower-calorie option, such as a glass of wine or light beer. Ask for dressings, sauces, and syrups on the side. These are usually high in calories, so you should limit the amount you eat. If you want a salad, choose a garden salad and ask for grilled meats. Avoid extra toppings such as bacon, cheese, or fried items. Ask for the dressing on the side, or ask for olive oil and vinegar or lemon to use as dressing. Estimate how many servings of a food you are given. Knowing serving sizes will help you be aware of how much food you are eating at restaurants. Where to find more information Centers for Disease Control and Prevention: http://www.wolf.info/ U.[place]. Department of Agriculture: http://www.wilson-mendoza.org/ Summary Calorie counting means keeping track of how many calories you eat and drink each day. If you eat fewer calories than your body needs, you should lose weight. A healthy amount of weight to lose per week is usually 1-2 lb (0.5-0.9 kg). This usually means reducing your daily calorie intake by 500-750 calories. The number of calories in a food can be found on a Nutrition Facts label. If a food does not have a Nutrition Facts label, try to look up the calories online or ask your dietitian for help. Use smaller plates, glasses, and bowls for smaller portions and to prevent overeating. Use your calories on foods and drinks that will fill you up and not leave you hungry shortly after a meal. This information is not intended to replace advice given to you by your health care provider. Make sure you discuss any questions you have with your health care provider. Document Revised: 03/05/2019 Document Reviewed: 03/05/2019 Elsevier Patient Education  [place]. Diabetes Mellitus and Exercise Exercising regularly is important for overall health, especially for people who have diabetes mellitus. Exercising is not only  about losing weight. It has many other health benefits, such as increasing muscle strength and bone density and reducing body fat and stress. This leads to improved fitness, flexibility, and endurance, all of which result in better overall health. What are the benefits of exercise if I have diabetes? Exercise has many benefits for people with diabetes. They include: Helping to lower and control blood sugar (glucose). Helping the body to respond  better to the hormone insulin by improving insulin sensitivity. Reducing how much insulin the body needs. Lowering the risk for heart disease by: Lowering "bad" cholesterol and triglyceride levels. Increasing "good" cholesterol levels. Lowering blood pressure. Lowering blood glucose levels. What is my activity plan? Your health care provider or certified diabetes educator can help you make a plan for the type and frequency of exercise that works for you. This is called your activity plan. Be sure to: Get at least 150 minutes of medium-intensity or high-intensity exercise each week. Exercises may include brisk walking, biking, or water aerobics. Do stretching and strengthening exercises, such as yoga or weight lifting, at least 2 times a week. Spread out your activity over at least 3 days of the week. Get some form of physical activity each day. Do not go more than 2 days in a row without some kind of physical activity. Avoid being inactive for more than 90 minutes at a time. Take frequent breaks to walk or stretch. Choose exercises or activities that you enjoy. Set realistic goals. Start slowly and gradually increase your exercise intensity over time. How do I manage my diabetes during exercise? Monitor your blood glucose Check your blood glucose before and after exercising. If your blood glucose is: 240 mg/dL (13.3 mmol/L) or higher before you exercise, check your urine for ketones. These are chemicals created by the liver. If you have ketones in your  urine, do not exercise until your blood glucose returns to normal. 100 mg/dL (5.6 mmol/L) or lower, eat a snack containing 15-20 grams of carbohydrate. Check your blood glucose 15 minutes after the snack to make sure that your glucose level is above 100 mg/dL (5.6 mmol/L) before you start your exercise. Know the symptoms of low blood glucose (hypoglycemia) and how to treat it. Your risk for hypoglycemia increases during and after exercise. Follow these tips and your health care provider's instructions Keep a carbohydrate snack that is fast-acting for use before, during, and after exercise to help prevent or treat hypoglycemia. Avoid injecting insulin into areas of the body that are going to be exercised. For example, avoid injecting insulin into: Your arms, when you are about to play tennis. Your legs, when you are about to go jogging. Keep records of your exercise habits. Doing this can help you and your health care provider adjust your diabetes management plan as needed. Write down: Food that you eat before and after you exercise. Blood glucose levels before and after you exercise. The type and amount of exercise you have done. Work with your health care provider when you start a new exercise or activity. He or she may need to: Make sure that the activity is safe for you. Adjust your insulin, other medicines, and food that you eat. Drink plenty of water while you exercise. This prevents loss of water (dehydration) and problems caused by a lot of heat in the body (heat stroke). Where to find more information American Diabetes Association: www.diabetes.org Summary Exercising regularly is important for overall health, especially for people who have diabetes mellitus. Exercising has many health benefits. It increases muscle strength and bone density and reduces body fat and stress. It also lowers and controls blood glucose. Your health care provider or certified diabetes educator can help you make  an activity plan for the type and frequency of exercise that works for you. Work with your health care provider to make sure any new activity is safe for you. Also work with your health care provider  to adjust your insulin, other medicines, and the food you eat. This information is not intended to replace advice given to you by your health care provider. Make sure you discuss any questions you have with your health care provider. Document Revised: 10/20/2018 Document Reviewed: 10/20/2018 Elsevier Patient Education  2022 [place] Injection What is this medication? SEMAGLUTIDE (SEM a GLOO tide) treats type 2 diabetes. It works by increasing insulin levels in your body, which decreases your blood sugar (glucose). It also reduces the amount of sugar released into the blood and slows down your digestion. It can also be used to lower the risk of heart attack and stroke in people with type 2 diabetes. Changes to diet and exercise are often combined with this medication. This medicine may be used for other purposes; ask your health care provider or pharmacist if you have questions. COMMON BRAND NAME(S): OZEMPIC What should I tell my care team before I take this medication? They need to know if you have any of these conditions: Endocrine tumors (MEN 2) or if someone in your family had these tumors Eye disease, vision problems History of pancreatitis Kidney disease Stomach problems Thyroid cancer or if someone in your family had thyroid cancer An unusual or allergic reaction to semaglutide, other medications, foods, dyes, or preservatives Pregnant or trying to get pregnant Breast-feeding How should I use this medication? This medication is for injection under the skin of your upper leg (thigh), stomach area, or upper arm. It is given once every week (every 7 days). You will be taught how to prepare and give this medication. Use exactly as directed. Take your medication at regular  intervals. Do not take it more often than directed. If you use this medication with insulin, you should inject this medication and the insulin separately. Do not mix them together. Do not give the injections right next to each other. Change (rotate) injection sites with each injection. It is important that you put your used needles and syringes in a special sharps container. Do not put them in a trash can. If you do not have a sharps container, call your pharmacist or care team to get one. A special MedGuide will be given to you by the pharmacist with each prescription and refill. Be sure to read this information carefully each time. This medication comes with INSTRUCTIONS FOR USE. Ask your pharmacist for directions on how to use this medication. Read the information carefully. Talk to your pharmacist or care team if you have questions. Talk to your care team about the use of this medication in children. Special care may be needed. Overdosage: If you think you have taken too much of this medicine contact a poison control center or emergency room at once. NOTE: This medicine is only for you. Do not share this medicine with others. What if I miss a dose? If you miss a dose, take it as soon as you can within 5 days after the missed dose. Then take your next dose at your regular weekly time. If it has been longer than 5 days after the missed dose, do not take the missed dose. Take the next dose at your regular time. Do not take double or extra doses. If you have questions about a missed dose, contact your care team for advice. What may interact with this medication? Other medications for diabetes Many medications may cause changes in blood sugar, these include: Alcohol containing beverages Antiviral medications for HIV or AIDS Aspirin and aspirin-like medications  Certain medications for blood pressure, heart disease, irregular heart beat Chromium Diuretics Male hormones, such as estrogens or  progestins, birth control pills Fenofibrate Gemfibrozil Isoniazid Lanreotide Male hormones or anabolic steroids MAOIs like Carbex, Eldepryl, Marplan, Nardil, and Parnate Medications for weight loss Medications for allergies, asthma, cold, or cough Medications for depression, anxiety, or psychotic disturbances Niacin Nicotine NSAIDs, medications for pain and inflammation, like ibuprofen or naproxen Octreotide Pasireotide Pentamidine Phenytoin Probenecid Quinolone antibiotics such as ciprofloxacin, levofloxacin, ofloxacin Some herbal dietary supplements Steroid medications such as prednisone or cortisone Sulfamethoxazole; trimethoprim Thyroid hormones Some medications can hide the warning symptoms of low blood sugar (hypoglycemia). You may need to monitor your blood sugar more closely if you are taking one of these medications. These include: Beta-blockers, often used for high blood pressure or heart problems (examples include atenolol, metoprolol, propranolol) Clonidine Guanethidine Reserpine This list may not describe all possible interactions. Give your health care provider a list of all the medicines, herbs, non-prescription drugs, or dietary supplements you use. Also tell them if you smoke, drink alcohol, or use illegal drugs. Some items may interact with your medicine. What should I watch for while using this medication? Visit your care team for regular checks on your progress. Drink plenty of fluids while taking this medication. Check with your care team if you get an attack of severe diarrhea, nausea, and vomiting. The loss of too much body fluid can make it dangerous for you to take this medication. A test called the HbA1C (A1C) will be monitored. This is a simple blood test. It measures your blood sugar control over the last 2 to 3 months. You will receive this test every 3 to 6 months. Learn how to check your blood sugar. Learn the symptoms of low and high blood sugar and  how to manage them. Always carry a quick-source of sugar with you in case you have symptoms of low blood sugar. Examples include hard sugar candy or glucose tablets. Make sure others know that you can choke if you eat or drink when you develop serious symptoms of low blood sugar, such as seizures or unconsciousness. They must get medical help at once. Tell your care team if you have high blood sugar. You might need to change the dose of your medication. If you are sick or exercising more than usual, you might need to change the dose of your medication. Do not skip meals. Ask your care team if you should avoid alcohol. Many nonprescription cough and cold products contain sugar or alcohol. These can affect blood sugar. Pens should never be shared. Even if the needle is changed, sharing may result in passing of viruses like hepatitis or HIV. Wear a medical ID bracelet or chain, and carry a card that describes your disease and details of your medication and dosage times. Do not become pregnant while taking this medication. Women should inform their care team if they wish to become pregnant or think they might be pregnant. There is a potential for serious side effects to an unborn child. Talk to your care team for more information. What side effects may I notice from receiving this medication? Side effects that you should report to your care team as soon as possible: Allergic reactions--skin rash, itching, hives, swelling of the face, lips, tongue, or throat Change in vision Dehydration--increased thirst, dry mouth, feeling faint or lightheaded, headache, dark yellow or brown urine Gallbladder problems--severe stomach pain, nausea, vomiting, fever Heart palpitations--rapid, pounding, or irregular heartbeat Kidney injury--decrease in  the amount of urine, swelling of the ankles, hands, or feet Pancreatitis--severe stomach pain that spreads to your back or gets worse after eating or when touched, fever,  nausea, vomiting Thyroid cancer--new mass or lump in the neck, pain or trouble swallowing, trouble breathing, hoarseness Side effects that usually do not require medical attention (report to your care team if they continue or are bothersome): Diarrhea Loss of appetite Nausea Stomach pain Vomiting This list may not describe all possible side effects. Call your doctor for medical advice about side effects. You may report side effects to FDA at 1-800-FDA-1088. Where should I keep my medication? Keep out of the reach of children. Store unopened pens in a refrigerator between 2 and 8 degrees C (36 and 46 degrees F). Do not freeze. Protect from light and heat. After you first use the pen, it can be stored for 56 days at room temperature between 15 and 30 degrees C (59 and 86 degrees F) or in a refrigerator. Throw away your used pen after 56 days or after the expiration date, whichever comes first. Do not store your pen with the needle attached. If the needle is left on, medication may leak from the pen. NOTE: This sheet is a summary. It may not cover all possible information. If you have questions about this medicine, talk to your doctor, pharmacist, or health care provider.  2022 Elsevier/Gold Standard (2020-04-28 00:00:00)

## 2021-01-05 LAB — CBC WITH DIFFERENTIAL/PLATELET
Basophils Absolute: 0.1 10*3/uL (ref 0.0–0.2)
Basos: 1 %
EOS (ABSOLUTE): 0.2 10*3/uL (ref 0.0–0.4)
Eos: 3 %
Hematocrit: 39.6 % (ref 37.5–51.0)
Hemoglobin: 13.7 g/dL (ref 13.0–17.7)
Immature Grans (Abs): 0.1 10*3/uL (ref 0.0–0.1)
Immature Granulocytes: 1 %
Lymphocytes Absolute: 1.8 10*3/uL (ref 0.7–3.1)
Lymphs: 25 %
MCH: 30.6 pg (ref 26.6–33.0)
MCHC: 34.6 g/dL (ref 31.5–35.7)
MCV: 88 fL (ref 79–97)
Monocytes Absolute: 0.6 10*3/uL (ref 0.1–0.9)
Monocytes: 9 %
Neutrophils Absolute: 4.3 10*3/uL (ref 1.4–7.0)
Neutrophils: 61 %
Platelets: 261 10*3/uL (ref 150–450)
RBC: 4.48 x10E6/uL (ref 4.14–5.80)
RDW: 13 % (ref 11.6–15.4)
WBC: 7 10*3/uL (ref 3.4–10.8)

## 2021-01-05 LAB — COMPREHENSIVE METABOLIC PANEL
ALT: 64 IU/L — ABNORMAL HIGH (ref 0–44)
AST: 68 IU/L — ABNORMAL HIGH (ref 0–40)
Albumin/Globulin Ratio: 1.3 (ref 1.2–2.2)
Albumin: 4 g/dL (ref 3.8–4.9)
Alkaline Phosphatase: 99 IU/L (ref 44–121)
BUN/Creatinine Ratio: 13 (ref 9–20)
BUN: 12 mg/dL (ref 6–24)
Bilirubin Total: 0.4 mg/dL (ref 0.0–1.2)
CO2: 22 mmol/L (ref 20–29)
Calcium: 8.7 mg/dL (ref 8.7–10.2)
Chloride: 104 mmol/L (ref 96–106)
Creatinine, Ser: 0.92 mg/dL (ref 0.76–1.27)
Globulin, Total: 3.2 g/dL (ref 1.5–4.5)
Glucose: 85 mg/dL (ref 70–99)
Potassium: 4.3 mmol/L (ref 3.5–5.2)
Sodium: 141 mmol/L (ref 134–144)
Total Protein: 7.2 g/dL (ref 6.0–8.5)
eGFR: 99 mL/min/{1.73_m2} (ref >59)

## 2021-01-09 ENCOUNTER — Encounter: Payer: Self-pay | Admitting: Internal Medicine

## 2021-02-01 ENCOUNTER — Other Ambulatory Visit: Payer: Self-pay

## 2021-02-01 ENCOUNTER — Other Ambulatory Visit: Payer: PRIVATE HEALTH INSURANCE

## 2021-02-01 DIAGNOSIS — E119 Type 2 diabetes mellitus without complications: Secondary | ICD-10-CM

## 2021-02-01 LAB — URINALYSIS, ROUTINE W REFLEX MICROSCOPIC
Bilirubin, UA: NEGATIVE
Glucose, UA: NEGATIVE
Ketones, UA: NEGATIVE
Leukocytes,UA: NEGATIVE
Nitrite, UA: NEGATIVE
Protein,UA: NEGATIVE
RBC, UA: NEGATIVE
Specific Gravity, UA: >1.03 — ABNORMAL HIGH (ref 1.005–1.030)
Urobilinogen, Ur: 0.2 mg/dL (ref 0.2–1.0)
pH, UA: 5 (ref 5.0–7.5)

## 2021-02-01 LAB — MICROALBUMIN, URINE WAIVED
Creatinine, Urine Waived: 200 mg/dL (ref 10–300)
Microalb, Ur Waived: 10 mg/L (ref 0–19)
Microalb/Creat Ratio: <30 mg/g (ref <30)

## 2021-02-02 LAB — COMPREHENSIVE METABOLIC PANEL
ALT: 49 IU/L — ABNORMAL HIGH (ref 0–44)
AST: 43 IU/L — ABNORMAL HIGH (ref 0–40)
Albumin/Globulin Ratio: 1.3 (ref 1.2–2.2)
Albumin: 4.2 g/dL (ref 3.8–4.9)
Alkaline Phosphatase: 95 IU/L (ref 44–121)
BUN/Creatinine Ratio: 18 (ref 9–20)
BUN: 17 mg/dL (ref 6–24)
Bilirubin Total: 0.4 mg/dL (ref 0.0–1.2)
CO2: 23 mmol/L (ref 20–29)
Calcium: 9.5 mg/dL (ref 8.7–10.2)
Chloride: 103 mmol/L (ref 96–106)
Creatinine, Ser: 0.97 mg/dL (ref 0.76–1.27)
Globulin, Total: 3.2 g/dL (ref 1.5–4.5)
Glucose: 105 mg/dL — ABNORMAL HIGH (ref 70–99)
Potassium: 4.6 mmol/L (ref 3.5–5.2)
Sodium: 139 mmol/L (ref 134–144)
Total Protein: 7.4 g/dL (ref 6.0–8.5)
eGFR: 93 mL/min/{1.73_m2} (ref >59)

## 2021-02-02 LAB — LIPID PANEL
Chol/HDL Ratio: 5.8 ratio — ABNORMAL HIGH (ref 0.0–5.0)
Cholesterol, Total: 181 mg/dL (ref 100–199)
HDL: 31 mg/dL — ABNORMAL LOW (ref >39)
LDL Chol Calc (NIH): 120 mg/dL — ABNORMAL HIGH (ref 0–99)
Triglycerides: 165 mg/dL — ABNORMAL HIGH (ref 0–149)
VLDL Cholesterol Cal: 30 mg/dL (ref 5–40)

## 2021-02-07 ENCOUNTER — Other Ambulatory Visit: Payer: Self-pay

## 2021-02-07 ENCOUNTER — Ambulatory Visit (INDEPENDENT_AMBULATORY_CARE_PROVIDER_SITE_OTHER): Payer: BC Managed Care – PPO | Admitting: Nurse Practitioner

## 2021-02-07 ENCOUNTER — Encounter: Payer: Self-pay | Admitting: Nurse Practitioner

## 2021-02-07 VITALS — BP 126/85 | HR 72 | Temp 98.2°F | Resp 18 | Wt 311.6 lb

## 2021-02-07 DIAGNOSIS — K5900 Constipation, unspecified: Secondary | ICD-10-CM

## 2021-02-07 DIAGNOSIS — E1169 Type 2 diabetes mellitus with other specified complication: Secondary | ICD-10-CM

## 2021-02-07 DIAGNOSIS — E119 Type 2 diabetes mellitus without complications: Secondary | ICD-10-CM

## 2021-02-07 DIAGNOSIS — E785 Hyperlipidemia, unspecified: Secondary | ICD-10-CM

## 2021-02-07 MED ORDER — ATORVASTATIN CALCIUM 20 MG PO TABS: 20.0000 mg | ORAL_TABLET | Freq: Every day | ORAL | 1 refills | Status: DC

## 2021-02-07 NOTE — Patient Instructions (Signed)
Colace over the counter for stool softener

## 2021-02-07 NOTE — Assessment & Plan Note (Signed)
ASCVD 14.9%. Will start atorvastatin 20mg  daily. His liver function has been slightly elevated in the past, will check liver function in 3-4 weeks after starting lipitor.

## 2021-02-07 NOTE — Assessment & Plan Note (Signed)
He has lost 11 pounds in last month! Congratulated him on this. Continue watching diet and exercise with goal to lose 1-2 pounds per week.

## 2021-02-07 NOTE — Assessment & Plan Note (Signed)
Home blood sugars have been improving. Fasting sugars for the last week range 100-110. Will continue ozempic at current dose of 0.5mg  weekly. Continue watching diet and losing weight. Follow up in 2 months.

## 2021-02-07 NOTE — Progress Notes (Signed)
Established Patient Office Visit  Subjective:  Patient ID: Casey Velez, male    DOB: [birth year]  Age: 57 y.o. MRN: [id number]  CC:  Chief Complaint  Patient presents with   Diabetes   Hyperlipidemia    HPI Casey Velez presents for follow up on diabetes  DIABETES  Hypoglycemic episodes:no Polydipsia/polyuria: no Visual disturbance: no Chest pain: no Paresthesias: no Glucose Monitoring: yes  Accucheck frequency: Daily  Fasting glucose: 100-108  Post prandial:  Evening:  Before meals: Taking Insulin?: no  Long acting insulin:  Short acting insulin: Blood Pressure Monitoring: not checking Retinal Examination: Not up to Date Foot Exam: Not up to Date Diabetic Education: Completed Pneumovax: Not up to Date Influenza: Up to Date Aspirin: yes  The 10-year ASCVD risk score (Arnett DK, et al., 2019) is: 12.7%   Values used to calculate the score:     Age: 58 years     Sex: Male     Is Non-Hispanic African American: No     Diabetic: Yes     Tobacco smoker: No     Systolic Blood Pressure: 867 mmHg     Is BP treated: No     HDL Cholesterol: 31 mg/dL     Total Cholesterol: 181 mg/dL   Past Medical History:  Diagnosis Date   Allergy    Arthritis    GERD (gastroesophageal reflux disease)    Hyperlipidemia 05/23/2020   Sleep apnea     Past Surgical History:  Procedure Laterality Date   APPENDECTOMY     COLONOSCOPY WITH PROPOFOL     COLONOSCOPY WITH PROPOFOL N/A 05/27/2020   Procedure: COLONOSCOPY WITH PROPOFOL;  Surgeon: Virgel Manifold, MD;  Location: [facility] ENDOSCOPY;  Service: Endoscopy;  Laterality: N/A;    Family History  Problem Relation Age of Onset   Diabetes Father    Glaucoma Father    Cancer Paternal Grandfather     Social History   Socioeconomic History   Marital status: Single    Spouse name: Not on file   Number of children: Not on file   Years of education: Not on file   Highest education level: Not on file  Occupational  History   Not on file  Tobacco Use   Smoking status: Never   Smokeless tobacco: Never  Vaping Use   Vaping Use: Never used  Substance and Sexual Activity   Alcohol use: Not on file    Comment: rare   Drug use: Never   Sexual activity: Not on file  Other Topics Concern   Not on file  Social History Narrative   Not on file   Social Determinants of Health   Financial Resource Strain: Not on file  Food Insecurity: Not on file  Transportation Needs: Not on file  Physical Activity: Not on file  Stress: Not on file  Social Connections: Not on file  Intimate Partner Violence: Not on file    Outpatient Medications Prior to Visit  Medication Sig Dispense Refill   aspirin EC 81 MG tablet Take 81 mg by mouth daily. Swallow whole.     famotidine (PEPCID) 20 MG tablet Take 1 tablet (20 mg total) by mouth 2 (two) times daily. 60 tablet 0   Multiple Vitamin (MULTIVITAMIN WITH MINERALS) TABS tablet Take 1 tablet by mouth daily.     Semaglutide,0.25 or 0.5MG/DOS, (OZEMPIC, 0.25 OR 0.5 MG/DOSE,) 2 MG/1.5ML SOPN Inject 0.5 mg into the skin once a week. 2 mL 5   Vitamin D,  Ergocalciferol, (DRISDOL) 1.25 MG (50000 UNIT) CAPS capsule Take 1 capsule (50,000 Units total) by mouth every 7 (seven) days. (taking one tablet per week) walk in lab in office 1-2 weeks after completing prescription. 12 capsule 0   blood glucose meter kit and supplies Dispense based on patient and insurance preference. Check glucose in the morning fasting and before meals. Use up to four times daily as directed. (FOR ICD-10 E10.9, E11.9). 1 each 0   polyethylene glycol-electrolytes (NULYTELY) 420 g solution PLEASE SEE ATTACHED FOR DETAILED DIRECTIONS (Patient not taking: Reported on 05/16/2020)     No facility-administered medications prior to visit.    No Known Allergies  ROS Review of Systems  Constitutional: Negative.   Respiratory: Negative.    Cardiovascular: Negative.   Gastrointestinal:  Positive for  constipation. Negative for abdominal pain and nausea.  Genitourinary: Negative.   Musculoskeletal: Negative.   Skin: Negative.   Neurological: Negative.   Psychiatric/Behavioral: Negative.       Objective:    Physical Exam Vitals and nursing note reviewed.  Constitutional:      Appearance: Normal appearance. He is obese.  HENT:     Head: Normocephalic.  Eyes:     Conjunctiva/sclera: Conjunctivae normal.  Cardiovascular:     Rate and Rhythm: Normal rate and regular rhythm.     Pulses: Normal pulses.     Heart sounds: Normal heart sounds.  Pulmonary:     Effort: Pulmonary effort is normal.     Breath sounds: Normal breath sounds.  Abdominal:     Palpations: Abdomen is soft.     Tenderness: There is no abdominal tenderness.  Musculoskeletal:     Cervical back: Normal range of motion.     Right lower leg: No edema.     Left lower leg: No edema.  Skin:    General: Skin is warm and dry.  Neurological:     General: No focal deficit present.     Mental Status: He is alert and oriented to person, place, and time.  Psychiatric:        Mood and Affect: Mood normal.        Behavior: Behavior normal.        Thought Content: Thought content normal.        Judgment: Judgment normal.   Diabetic Foot Exam - Simple   Simple Foot Form Visual Inspection See comments: Yes Sensation Testing Intact to touch and monofilament testing bilaterally: Yes Pulse Check Posterior Tibialis and Dorsalis pulse intact bilaterally: Yes Comments Small blister to pinky toe on right foot. No signs of infection     BP 126/85 (BP Location: Left Arm, Patient Position: Sitting)    Pulse 72    Temp 98.2 F (36.8 C) (Oral)    Resp 18    Wt (!) 311 lb 9.6 oz (141.3 kg)    SpO2 97%    BMI 50.29 kg/m  Wt Readings from Last 3 Encounters:  02/07/21 (!) 311 lb 9.6 oz (141.3 kg)  01/04/21 (!) 322 lb 8 oz (146.3 kg)  07/22/20 (!) 320 lb (145.2 kg)     Health Maintenance Due  Topic Date Due    Pneumococcal Vaccine 56-14 Years old (1 - PCV) Never done   FOOT EXAM  Never done   OPHTHALMOLOGY EXAM  Never done   TETANUS/TDAP  Never done    There are no preventive care reminders to display for this patient.  Lab Results  Component Value Date   TSH 3.270 04/26/2020  Lab Results  Component Value Date   WBC 7.0 01/04/2021   HGB 13.7 01/04/2021   HCT 39.6 01/04/2021   MCV 88 01/04/2021   PLT 261 01/04/2021   Lab Results  Component Value Date   NA 139 02/01/2021   K 4.6 02/01/2021   CO2 23 02/01/2021   GLUCOSE 105 (H) 02/01/2021   BUN 17 02/01/2021   CREATININE 0.97 02/01/2021   BILITOT 0.4 02/01/2021   ALKPHOS 95 02/01/2021   AST 43 (H) 02/01/2021   ALT 49 (H) 02/01/2021   PROT 7.4 02/01/2021   ALBUMIN 4.2 02/01/2021   CALCIUM 9.5 02/01/2021   ANIONGAP 9 07/15/2020   EGFR 93 02/01/2021   Lab Results  Component Value Date   CHOL 181 02/01/2021   Lab Results  Component Value Date   HDL 31 (L) 02/01/2021   Lab Results  Component Value Date   LDLCALC 120 (H) 02/01/2021   Lab Results  Component Value Date   TRIG 165 (H) 02/01/2021   Lab Results  Component Value Date   CHOLHDL 5.8 (H) 02/01/2021   Lab Results  Component Value Date   HGBA1C 6.5 (H) 01/04/2021      Assessment & Plan:   Problem List Items Addressed This Visit       Endocrine   Diabetes mellitus without complication ([place]) - Primary    Home blood sugars have been improving. Fasting sugars for the last week range 100-110. Will continue ozempic at current dose of 0.57m weekly. Continue watching diet and losing weight. Follow up in 2 months.       Relevant Medications   atorvastatin (LIPITOR) 20 MG tablet   Hyperlipidemia associated with type 2 diabetes mellitus (HCC)    ASCVD 14.9%. Will start atorvastatin 235mdaily. His liver function has been slightly elevated in the past, will check liver function in 3-4 weeks after starting lipitor.       Relevant Medications   atorvastatin  (LIPITOR) 20 MG tablet   Other Relevant Orders   Comp Met (CMET)     Other   Morbid obesity ([facility]   He has lost 11 pounds in last month! Congratulated him on this. Continue watching diet and exercise with goal to lose 1-2 pounds per week.       Other Visit Diagnoses     Constipation, unspecified constipation type       Started with recent change in diet, increasing vegetables. Encouraged him to increase fluid intake. Can start colace prn constipation       Meds ordered this encounter  Medications   atorvastatin (LIPITOR) 20 MG tablet    Sig: Take 1 tablet (20 mg total) by mouth daily.    Dispense:  30 tablet    Refill:  1    Follow-up: Return in about 2 months (around 04/07/2021) for Diabetes, HLD.    LaCharyl DancerNP

## 2021-02-12 ENCOUNTER — Encounter: Payer: Self-pay | Admitting: Internal Medicine

## 2021-02-15 ENCOUNTER — Telehealth: Payer: Self-pay | Admitting: Internal Medicine

## 2021-02-15 NOTE — Telephone Encounter (Signed)
Medication Refill - Medication: Semaglutide,0.25 or 0.5MG /DOS, (OZEMPIC, 0.25 OR 0.5 MG/DOSE,) 2 MG/1.5ML SOPN  Has the patient contacted their pharmacy? No. (Agent: If no, request that the patient contact the pharmacy for the refill. If patient does not wish to contact the pharmacy document the reason why and proceed with request.) Was told by pcp that she was gonna send it in for him and it hasn't been sent in yet and he's out as of today   Preferred Pharmacy (with phone number or street name):  [id number] - [place], [place] - [address]  Phone:  [phone] Fax:  [phone]    Has the patient been seen for an appointment in the last year OR does the patient have an upcoming appointment? Yes.    Agent: Please be advised that RX refills may take up to 3 business days. We ask that you follow-up with your pharmacy.

## 2021-02-15 NOTE — Telephone Encounter (Signed)
TC to pharmacy-verified the patient had 5 refills on file per pharmacist.  TC to patient to inform of refills available and the pharmacist will have one ready for pickup this afternoon.  Patient was very appreciative of call and offered compliments to the entire staff for their helpfulness with his care.

## 2021-02-16 ENCOUNTER — Other Ambulatory Visit: Payer: Self-pay | Admitting: Internal Medicine

## 2021-02-16 MED ORDER — OZEMPIC (0.25 OR 0.5 MG/DOSE) 2 MG/1.5ML ~~LOC~~ SOPN: 0.5000 mg | PEN_INJECTOR | SUBCUTANEOUS | 5 refills | Status: DC

## 2021-02-16 NOTE — Telephone Encounter (Signed)
Sent in thnx.

## 2021-03-01 ENCOUNTER — Other Ambulatory Visit: Payer: Self-pay | Admitting: Nurse Practitioner

## 2021-03-07 ENCOUNTER — Other Ambulatory Visit: Payer: PRIVATE HEALTH INSURANCE

## 2021-03-07 ENCOUNTER — Other Ambulatory Visit: Payer: Self-pay

## 2021-03-07 DIAGNOSIS — E559 Vitamin D deficiency, unspecified: Secondary | ICD-10-CM

## 2021-03-07 DIAGNOSIS — E119 Type 2 diabetes mellitus without complications: Secondary | ICD-10-CM

## 2021-03-07 DIAGNOSIS — E785 Hyperlipidemia, unspecified: Secondary | ICD-10-CM

## 2021-03-07 DIAGNOSIS — E1169 Type 2 diabetes mellitus with other specified complication: Secondary | ICD-10-CM

## 2021-03-07 LAB — BAYER DCA HB A1C WAIVED: HB A1C (BAYER DCA - WAIVED): 5.7 % — ABNORMAL HIGH (ref 4.8–5.6)

## 2021-03-08 LAB — COMPREHENSIVE METABOLIC PANEL
ALT: 34 IU/L (ref 0–44)
AST: 38 IU/L (ref 0–40)
Albumin/Globulin Ratio: 1.6 (ref 1.2–2.2)
Albumin: 4.5 g/dL (ref 3.8–4.9)
Alkaline Phosphatase: 91 IU/L (ref 44–121)
BUN/Creatinine Ratio: 14 (ref 9–20)
BUN: 14 mg/dL (ref 6–24)
Bilirubin Total: 0.3 mg/dL (ref 0.0–1.2)
CO2: 20 mmol/L (ref 20–29)
Calcium: 9.7 mg/dL (ref 8.7–10.2)
Chloride: 102 mmol/L (ref 96–106)
Creatinine, Ser: 1 mg/dL (ref 0.76–1.27)
Globulin, Total: 2.8 g/dL (ref 1.5–4.5)
Glucose: 97 mg/dL (ref 70–99)
Potassium: 4.7 mmol/L (ref 3.5–5.2)
Sodium: 139 mmol/L (ref 134–144)
Total Protein: 7.3 g/dL (ref 6.0–8.5)
eGFR: 89 mL/min/{1.73_m2} (ref >59)

## 2021-03-08 LAB — CBC WITH DIFFERENTIAL/PLATELET
Basophils Absolute: 0.1 10*3/uL (ref 0.0–0.2)
Basos: 1 %
EOS (ABSOLUTE): 0.2 10*3/uL (ref 0.0–0.4)
Eos: 2 %
Hematocrit: 44.2 % (ref 37.5–51.0)
Hemoglobin: 14.8 g/dL (ref 13.0–17.7)
Immature Grans (Abs): 0 10*3/uL (ref 0.0–0.1)
Immature Granulocytes: 0 %
Lymphocytes Absolute: 1.9 10*3/uL (ref 0.7–3.1)
Lymphs: 23 %
MCH: 30.3 pg (ref 26.6–33.0)
MCHC: 33.5 g/dL (ref 31.5–35.7)
MCV: 90 fL (ref 79–97)
Monocytes Absolute: 0.6 10*3/uL (ref 0.1–0.9)
Monocytes: 8 %
Neutrophils Absolute: 5.6 10*3/uL (ref 1.4–7.0)
Neutrophils: 66 %
Platelets: 225 10*3/uL (ref 150–450)
RBC: 4.89 x10E6/uL (ref 4.14–5.80)
RDW: 13 % (ref 11.6–15.4)
WBC: 8.4 10*3/uL (ref 3.4–10.8)

## 2021-03-08 LAB — LIPID PANEL
Chol/HDL Ratio: 3.7 ratio (ref 0.0–5.0)
Cholesterol, Total: 130 mg/dL (ref 100–199)
HDL: 35 mg/dL — ABNORMAL LOW (ref >39)
LDL Chol Calc (NIH): 74 mg/dL (ref 0–99)
Triglycerides: 116 mg/dL (ref 0–149)
VLDL Cholesterol Cal: 21 mg/dL (ref 5–40)

## 2021-03-08 LAB — THYROID PANEL WITH TSH
Free Thyroxine Index: 1.5 (ref 1.2–4.9)
T3 Uptake Ratio: 23 % — ABNORMAL LOW (ref 24–39)
T4, Total: 6.4 ug/dL (ref 4.5–12.0)
TSH: 2.83 u[IU]/mL (ref 0.450–4.500)

## 2021-03-31 NOTE — H&P (Signed)
NAMETAYM, TWIST MEDICAL RECORD NO: [id number] ACCOUNT NO: [id number] DATE OF BIRTH: 1965/03/31 PHYSICIAN: Otelia Limes. Yves Dill, MD  History and Physical   DATE OF ADMISSION: 04/06/2021  CHIEF COMPLAINT:  Left hydrocele.  HISTORY OF PRESENT ILLNESS:  The patient is a 56 year old white male with a left hydrocele, dating back to 2022.  He was initially treated with drainage and sclerotherapy on 11/16/2020.  However, the hydrocele has since recurred and I encouraged that he  proceed with surgery. He comes in now for surgical correction of the left hydrocele.  PAST MEDICAL HISTORY:  No drug allergies.  CURRENT MEDICATIONS:  Aspirin, over-the-counter allergy medicine, omeprazole, and multivitamins.  PAST SURGICAL HISTORY:  Appendectomy 2001.  PAST AND CURRENT MEDICAL CONDITIONS: 1.  GERD. 2.  Seasonal allergies.  REVIEW OF SYSTEMS:  Patient denies chest pain, shortness of breath, diabetes, stroke, heart disease.  SOCIAL HISTORY:  The patient denied tobacco or alcohol use.  FAMILY HISTORY:  Father died at age 9 with a stroke, but also had diabetes and heart disease.  Mother is living, age 63 without health problems.  There is no family history of urological disease or urological malignancy.  PHYSICAL EXAMINATION:   VITAL SIGNS:  Height was 5 feet 6 inches, weight 324 pounds, BMI 51. GENERAL:  Obese white male in no acute distress. HEENT:  Sclerae were clear.  Pupils are equally round, reactive to light and accommodation.  Extraocular movements are intact. NECK:  No palpable masses or tenderness. LYMPHATIC:  No palpable cervical or inguinal adenopathy. PULMONARY:  Lungs clear to auscultation. CARDIOVASCULAR:  Regular rhythm and rate without audible murmurs. ABDOMEN:  Soft and nontender abdomen. GENITOURINARY:  He was circumcised with a buried penis.  He had a large transilluminating left hydrocele.  Both testes were smooth, nontender, approximately 20 mL in size each.  Rectal exam  was deferred. NEUROMUSCULAR:  Grossly intact.  IMPRESSION:  Recurrent left hydrocele.  PLAN:  Left hydrocelectomy.   SHY D: 03/30/2021 1:24:47 pm T: 03/30/2021 3:13:00 pm  JOB: [id number]/ [id number]

## 2021-04-03 ENCOUNTER — Encounter: Payer: Self-pay | Admitting: Internal Medicine

## 2021-04-03 ENCOUNTER — Other Ambulatory Visit: Payer: Self-pay

## 2021-04-03 ENCOUNTER — Other Ambulatory Visit: Payer: PRIVATE HEALTH INSURANCE

## 2021-04-03 ENCOUNTER — Encounter
Admission: RE | Admit: 2021-04-03 | Discharge: 2021-04-03 | Disposition: A | Discharge status: Home / Self Care | Payer: PRIVATE HEALTH INSURANCE | Source: Ambulatory Visit | Attending: Urology | Admitting: Urology

## 2021-04-03 HISTORY — DX: Type 2 diabetes mellitus without complications: E11.9

## 2021-04-03 HISTORY — DX: Morbid (severe) obesity due to excess calories: E66.01

## 2021-04-03 HISTORY — DX: Vitamin D deficiency, unspecified: E55.9

## 2021-04-03 HISTORY — DX: Polyp of colon: K63.5

## 2021-04-03 HISTORY — DX: Hydrocele, unspecified: N43.3

## 2021-04-03 NOTE — Telephone Encounter (Signed)
Yes thnx

## 2021-04-03 NOTE — Patient Instructions (Addendum)
Your procedure is scheduled on: Thursday, March 2 Report to the Registration Desk on the 1st floor of the [facility]. To find out your arrival time, please call [phone] between 1PM - 3PM on: Wednesday, March 1  REMEMBER: Instructions that are not followed completely may result in serious medical risk, up to and including death; or upon the discretion of your surgeon and anesthesiologist your surgery may need to be rescheduled.  Do not eat food after midnight the night before surgery.  No gum chewing, lozengers or hard candies.  You may however, drink WATER up to 2 hours before you are scheduled to arrive for your surgery. Do not drink anything within 2 hours of your scheduled arrival time.  TAKE THESE MEDICATIONS THE MORNING OF SURGERY WITH A SIP OF WATER:  Famotidine (Pepcid)  One week prior to surgery: Stop Anti-inflammatories (NSAIDS) such as Advil, Aleve, Ibuprofen, Motrin, Naproxen, Naprosyn and Aspirin based products such as Excedrin, Goodys Powder, BC Powder. Stop ANY OVER THE COUNTER supplements until after surgery. You may however, continue to take Tylenol if needed for pain up until the day of surgery.  No Alcohol for 24 hours before or after surgery.  No Smoking including e-cigarettes for 24 hours prior to surgery.  No chewable tobacco products for at least 6 hours prior to surgery.  No nicotine patches on the day of surgery.  Do not use any "recreational" drugs for at least a week prior to your surgery.  Please be advised that the combination of cocaine and anesthesia may have negative outcomes, up to and including death. If you test positive for cocaine, your surgery will be cancelled.  On the morning of surgery brush your teeth with toothpaste and water, you may rinse your mouth with mouthwash if you wish. Do not swallow any toothpaste or mouthwash.  Do not wear jewelry.  Do not wear lotions, powders, or perfumes.   Do not shave body from the neck down 48  hours prior to surgery just in case you cut yourself which could leave a site for infection.   Contact lenses, hearing aids and dentures may not be worn into surgery.  Do not bring valuables to the hospital. [facility] is not responsible for any missing/lost belongings or valuables.   Bring your C-PAP to the hospital with you in case you may have to spend the night.   Notify your doctor if there is any change in your medical condition (cold, fever, infection).  Wear comfortable clothing (specific to your surgery type) to the hospital.  After surgery, you can help prevent lung complications by doing breathing exercises.  Take deep breaths and cough every 1-2 hours. Your doctor may order a device called an Incentive Spirometer to help you take deep breaths.  If you are being discharged the day of surgery, you will not be allowed to drive home. You will need a responsible adult (18 years or older) to drive you home and stay with you that night.   If you are taking public transportation, you will need to have a responsible adult (18 years or older) with you. Please confirm with your physician that it is acceptable to use public transportation.   Please call the [facility] at [phone] if you have any questions about these instructions.  Surgery Visitation Policy:  Patients undergoing a surgery or procedure may have one family member or support person with them as long as that person is not COVID-19 positive or experiencing its  symptoms.  That person may remain in the waiting area during the procedure and may rotate out with other people.

## 2021-04-04 ENCOUNTER — Other Ambulatory Visit: Payer: PRIVATE HEALTH INSURANCE

## 2021-04-04 ENCOUNTER — Encounter: Payer: Self-pay | Admitting: Urgent Care

## 2021-04-04 ENCOUNTER — Encounter
Admission: RE | Admit: 2021-04-04 | Discharge: 2021-04-04 | Disposition: A | Discharge status: Home / Self Care | Payer: PRIVATE HEALTH INSURANCE | Source: Ambulatory Visit | Attending: Urology | Admitting: Urology

## 2021-04-04 DIAGNOSIS — E118 Type 2 diabetes mellitus with unspecified complications: Secondary | ICD-10-CM | POA: Insufficient documentation

## 2021-04-04 DIAGNOSIS — Z01810 Encounter for preprocedural cardiovascular examination: Secondary | ICD-10-CM | POA: Diagnosis present

## 2021-04-04 DIAGNOSIS — E785 Hyperlipidemia, unspecified: Secondary | ICD-10-CM

## 2021-04-04 DIAGNOSIS — E1169 Type 2 diabetes mellitus with other specified complication: Secondary | ICD-10-CM

## 2021-04-04 DIAGNOSIS — E119 Type 2 diabetes mellitus without complications: Secondary | ICD-10-CM

## 2021-04-05 LAB — COMPREHENSIVE METABOLIC PANEL
ALT: 29 IU/L (ref 0–44)
AST: 30 IU/L (ref 0–40)
Albumin/Globulin Ratio: 1.4 (ref 1.2–2.2)
Albumin: 4.3 g/dL (ref 3.8–4.9)
Alkaline Phosphatase: 95 IU/L (ref 44–121)
BUN/Creatinine Ratio: 14 (ref 9–20)
BUN: 14 mg/dL (ref 6–24)
Bilirubin Total: 0.2 mg/dL (ref 0.0–1.2)
CO2: 24 mmol/L (ref 20–29)
Calcium: 9.4 mg/dL (ref 8.7–10.2)
Chloride: 106 mmol/L (ref 96–106)
Creatinine, Ser: 0.97 mg/dL (ref 0.76–1.27)
Globulin, Total: 3 g/dL (ref 1.5–4.5)
Glucose: 97 mg/dL (ref 70–99)
Potassium: 4.6 mmol/L (ref 3.5–5.2)
Sodium: 143 mmol/L (ref 134–144)
Total Protein: 7.3 g/dL (ref 6.0–8.5)
eGFR: 93 mL/min/{1.73_m2} (ref >59)

## 2021-04-05 LAB — LIPID PANEL
Chol/HDL Ratio: 5.2 ratio — ABNORMAL HIGH (ref 0.0–5.0)
Cholesterol, Total: 192 mg/dL (ref 100–199)
HDL: 37 mg/dL — ABNORMAL LOW (ref >39)
LDL Chol Calc (NIH): 123 mg/dL — ABNORMAL HIGH (ref 0–99)
Triglycerides: 182 mg/dL — ABNORMAL HIGH (ref 0–149)
VLDL Cholesterol Cal: 32 mg/dL (ref 5–40)

## 2021-04-05 LAB — HEMOGLOBIN A1C
Est. average glucose Bld gHb Est-mCnc: 123 mg/dL
Hgb A1c MFr Bld: 5.9 % — ABNORMAL HIGH (ref 4.8–5.6)

## 2021-04-05 MED ORDER — CHLORHEXIDINE GLUCONATE 0.12 % MT SOLN: 15.0000 mL | Freq: Once | OROMUCOSAL | Status: AC

## 2021-04-05 MED ORDER — ORAL CARE MOUTH RINSE: 15.0000 mL | Freq: Once | OROMUCOSAL | Status: AC

## 2021-04-05 MED ORDER — CEFAZOLIN SODIUM-DEXTROSE 1-4 GM/50ML-% IV SOLN
1.0000 g | Freq: Once | INTRAVENOUS | Status: AC
  Administered 2021-04-06: 1 g via INTRAVENOUS

## 2021-04-05 MED ORDER — SODIUM CHLORIDE 0.9 % IV SOLN: INTRAVENOUS | Status: DC

## 2021-04-06 ENCOUNTER — Ambulatory Visit: Payer: Self-pay | Admitting: Anesthesiology

## 2021-04-06 ENCOUNTER — Encounter: Payer: Self-pay | Admitting: Urology

## 2021-04-06 ENCOUNTER — Other Ambulatory Visit: Payer: Self-pay

## 2021-04-06 ENCOUNTER — Ambulatory Visit
Admission: RE | Admit: 2021-04-06 | Discharge: 2021-04-06 | Disposition: A | Discharge status: Home / Self Care | Payer: Self-pay | Attending: Urology | Admitting: Urology

## 2021-04-06 ENCOUNTER — Encounter
Admission: RE | Disposition: A | Discharge status: Home / Self Care | Payer: Self-pay | Source: Home / Self Care | Attending: Urology

## 2021-04-06 DIAGNOSIS — K219 Gastro-esophageal reflux disease without esophagitis: Secondary | ICD-10-CM | POA: Insufficient documentation

## 2021-04-06 DIAGNOSIS — N433 Hydrocele, unspecified: Secondary | ICD-10-CM | POA: Insufficient documentation

## 2021-04-06 DIAGNOSIS — E119 Type 2 diabetes mellitus without complications: Secondary | ICD-10-CM | POA: Insufficient documentation

## 2021-04-06 DIAGNOSIS — Z6841 Body Mass Index (BMI) 40.0 and over, adult: Secondary | ICD-10-CM | POA: Insufficient documentation

## 2021-04-06 HISTORY — PX: HYDROCELE EXCISION: SHX482

## 2021-04-06 LAB — GLUCOSE, CAPILLARY
Glucose-Capillary: 114 mg/dL — ABNORMAL HIGH (ref 70–99)
Glucose-Capillary: 111 mg/dL — ABNORMAL HIGH (ref 70–99)

## 2021-04-06 SURGERY — HYDROCELECTOMY
Anesthesia: General | Laterality: Left

## 2021-04-06 MED ORDER — BUPIVACAINE HCL 0.5 % IJ SOLN
INTRAMUSCULAR | Status: DC | PRN
  Administered 2021-04-06: 5 mL

## 2021-04-06 MED ORDER — DEXAMETHASONE SODIUM PHOSPHATE 10 MG/ML IJ SOLN
INTRAMUSCULAR | Status: DC | PRN
  Administered 2021-04-06: 10 mg via INTRAVENOUS

## 2021-04-06 MED ORDER — LIDOCAINE HCL (PF) 1 % IJ SOLN
INTRAMUSCULAR | Status: AC
  Filled 2021-04-06: qty 30

## 2021-04-06 MED ORDER — ACETAMINOPHEN 10 MG/ML IV SOLN
INTRAVENOUS | Status: AC
  Filled 2021-04-06: qty 100

## 2021-04-06 MED ORDER — FENTANYL CITRATE (PF) 100 MCG/2ML IJ SOLN
INTRAMUSCULAR | Status: DC | PRN
  Administered 2021-04-06 (×2): 50 ug via INTRAVENOUS

## 2021-04-06 MED ORDER — CEPHALEXIN 500 MG PO CAPS: 500.0000 mg | ORAL_CAPSULE | Freq: Four times a day (QID) | ORAL | 0 refills | Status: DC

## 2021-04-06 MED ORDER — MIDAZOLAM HCL 2 MG/2ML IJ SOLN
INTRAMUSCULAR | Status: AC
  Filled 2021-04-06: qty 2

## 2021-04-06 MED ORDER — ROCURONIUM BROMIDE 100 MG/10ML IV SOLN
INTRAVENOUS | Status: DC | PRN
  Administered 2021-04-06: 10 mg via INTRAVENOUS
  Administered 2021-04-06: 30 mg via INTRAVENOUS

## 2021-04-06 MED ORDER — KETOROLAC TROMETHAMINE 30 MG/ML IJ SOLN
INTRAMUSCULAR | Status: DC | PRN
  Administered 2021-04-06: 30 mg via INTRAVENOUS

## 2021-04-06 MED ORDER — 0.9 % SODIUM CHLORIDE (POUR BTL) OPTIME
TOPICAL | Status: DC | PRN
  Administered 2021-04-06: 500 mL

## 2021-04-06 MED ORDER — LIDOCAINE HCL (PF) 2 % IJ SOLN
INTRAMUSCULAR | Status: AC
  Filled 2021-04-06: qty 5

## 2021-04-06 MED ORDER — LIDOCAINE HCL (CARDIAC) PF 100 MG/5ML IV SOSY
PREFILLED_SYRINGE | INTRAVENOUS | Status: DC | PRN
Start: 2021-04-06 — End: 2021-04-06
  Administered 2021-04-06: 100 mg via INTRAVENOUS

## 2021-04-06 MED ORDER — CEFAZOLIN SODIUM-DEXTROSE 1-4 GM/50ML-% IV SOLN
INTRAVENOUS | Status: AC
  Filled 2021-04-06: qty 50

## 2021-04-06 MED ORDER — BUPIVACAINE HCL (PF) 0.5 % IJ SOLN
INTRAMUSCULAR | Status: AC
  Filled 2021-04-06: qty 30

## 2021-04-06 MED ORDER — SUCCINYLCHOLINE CHLORIDE 200 MG/10ML IV SOSY
PREFILLED_SYRINGE | INTRAVENOUS | Status: AC
  Filled 2021-04-06: qty 10

## 2021-04-06 MED ORDER — FENTANYL CITRATE (PF) 100 MCG/2ML IJ SOLN: 25.0000 ug | INTRAMUSCULAR | Status: DC | PRN

## 2021-04-06 MED ORDER — SUGAMMADEX SODIUM 500 MG/5ML IV SOLN
INTRAVENOUS | Status: DC | PRN
  Administered 2021-04-06: 300 mg via INTRAVENOUS

## 2021-04-06 MED ORDER — OXYCODONE HCL 5 MG PO TABS: 5.0000 mg | ORAL_TABLET | Freq: Once | ORAL | Status: DC | PRN

## 2021-04-06 MED ORDER — DEXMEDETOMIDINE (PRECEDEX) IN NS 20 MCG/5ML (4 MCG/ML) IV SYRINGE
PREFILLED_SYRINGE | INTRAVENOUS | Status: DC | PRN
  Administered 2021-04-06: 4 ug via INTRAVENOUS
  Administered 2021-04-06: 8 ug via INTRAVENOUS

## 2021-04-06 MED ORDER — ONDANSETRON HCL 4 MG/2ML IJ SOLN
INTRAMUSCULAR | Status: DC | PRN
  Administered 2021-04-06: 4 mg via INTRAVENOUS

## 2021-04-06 MED ORDER — DEXAMETHASONE SODIUM PHOSPHATE 10 MG/ML IJ SOLN
INTRAMUSCULAR | Status: AC
  Filled 2021-04-06: qty 1

## 2021-04-06 MED ORDER — ONDANSETRON HCL 4 MG/2ML IJ SOLN
INTRAMUSCULAR | Status: AC
  Filled 2021-04-06: qty 2

## 2021-04-06 MED ORDER — FENTANYL CITRATE (PF) 100 MCG/2ML IJ SOLN
INTRAMUSCULAR | Status: AC
  Filled 2021-04-06: qty 2

## 2021-04-06 MED ORDER — MIDAZOLAM HCL 2 MG/2ML IJ SOLN
INTRAMUSCULAR | Status: DC | PRN
  Administered 2021-04-06: 2 mg via INTRAVENOUS

## 2021-04-06 MED ORDER — OXYCODONE HCL 5 MG/5ML PO SOLN: 5.0000 mg | Freq: Once | ORAL | Status: DC | PRN

## 2021-04-06 MED ORDER — SUCCINYLCHOLINE CHLORIDE 200 MG/10ML IV SOSY
PREFILLED_SYRINGE | INTRAVENOUS | Status: DC | PRN
  Administered 2021-04-06: 120 mg via INTRAVENOUS

## 2021-04-06 MED ORDER — ACETAMINOPHEN 10 MG/ML IV SOLN
INTRAVENOUS | Status: DC | PRN
  Administered 2021-04-06: 1000 mg via INTRAVENOUS

## 2021-04-06 MED ORDER — SUGAMMADEX SODIUM 500 MG/5ML IV SOLN
INTRAVENOUS | Status: AC
  Filled 2021-04-06: qty 5

## 2021-04-06 MED ORDER — ACETAMINOPHEN-CODEINE #3 300-30 MG PO TABS: 1.0000 | ORAL_TABLET | ORAL | 1 refills | Status: DC | PRN

## 2021-04-06 MED ORDER — ROCURONIUM BROMIDE 10 MG/ML (PF) SYRINGE
PREFILLED_SYRINGE | INTRAVENOUS | Status: AC
  Filled 2021-04-06: qty 10

## 2021-04-06 MED ORDER — ACETAMINOPHEN 10 MG/ML IV SOLN: 1000.0000 mg | Freq: Once | INTRAVENOUS | Status: DC | PRN

## 2021-04-06 MED ORDER — LIDOCAINE HCL 1 % IJ SOLN
INTRAMUSCULAR | Status: DC | PRN
  Administered 2021-04-06: 5 mL

## 2021-04-06 MED ORDER — PROPOFOL 10 MG/ML IV BOLUS
INTRAVENOUS | Status: AC
  Filled 2021-04-06: qty 20

## 2021-04-06 MED ORDER — CHLORHEXIDINE GLUCONATE 0.12 % MT SOLN
OROMUCOSAL | Status: AC
  Administered 2021-04-06: 15 mL via OROMUCOSAL
  Filled 2021-04-06: qty 15

## 2021-04-06 MED ORDER — DROPERIDOL 2.5 MG/ML IJ SOLN
0.6250 mg | Freq: Once | INTRAMUSCULAR | Status: DC | PRN
  Filled 2021-04-06: qty 2

## 2021-04-06 SURGICAL SUPPLY — 31 items
ADH SKN CLS APL DERMABOND .7 (GAUZE/BANDAGES/DRESSINGS) ×1
BLADE CLIPPER SURG (BLADE) ×1 IMPLANT
BLADE SURG 15 STRL LF DISP TIS (BLADE) ×1 IMPLANT
BLADE SURG 15 STRL SS (BLADE) ×2
DERMABOND ADVANCED (GAUZE/BANDAGES/DRESSINGS) ×1
DERMABOND ADVANCED .7 DNX12 (GAUZE/BANDAGES/DRESSINGS) ×1 IMPLANT
DRAPE LAPAROTOMY 77X122 PED (DRAPES) ×2 IMPLANT
DRSG GAUZE FLUFF 36X18 (GAUZE/BANDAGES/DRESSINGS) ×2 IMPLANT
DRSG TELFA 4X3 1S NADH ST (GAUZE/BANDAGES/DRESSINGS) ×2 IMPLANT
ELECT CAUTERY NEEDLE TIP 1.0 (MISCELLANEOUS) ×2
ELECT REM PT RETURN 9FT ADLT (ELECTROSURGICAL) ×2
ELECTRODE CAUTERY NEDL TIP 1.0 (MISCELLANEOUS) ×1 IMPLANT
ELECTRODE REM PT RTRN 9FT ADLT (ELECTROSURGICAL) ×1 IMPLANT
GAUZE 4X4 16PLY ~~LOC~~+RFID DBL (SPONGE) ×2 IMPLANT
GLOVE SURG ENC MOIS LTX SZ7.5 (GLOVE) ×2 IMPLANT
GOWN STRL REUS W/ TWL LRG LVL3 (GOWN DISPOSABLE) ×2 IMPLANT
GOWN STRL REUS W/TWL LRG LVL3 (GOWN DISPOSABLE) ×4
KIT TURNOVER KIT A (KITS) ×2 IMPLANT
LABEL OR SOLS (LABEL) ×2 IMPLANT
MANIFOLD NEPTUNE II (INSTRUMENTS) ×2 IMPLANT
NEEDLE HYPO 22GX1.5 SAFETY (NEEDLE) ×2 IMPLANT
NS IRRIG 500ML POUR BTL (IV SOLUTION) ×2 IMPLANT
PACK BASIN MINOR ARMC (MISCELLANEOUS) ×2 IMPLANT
SOL PREP PVP 2OZ (MISCELLANEOUS) ×2
SOLUTION PREP PVP 2OZ (MISCELLANEOUS) ×1 IMPLANT
SUPPORETR ATHLETIC LG (MISCELLANEOUS) ×1 IMPLANT
SUPPORTER ATHLETIC LG (MISCELLANEOUS)
SUT CHROMIC 3 0 SH 27 (SUTURE) ×6 IMPLANT
SYR 10ML LL (SYRINGE) ×2 IMPLANT
SYR 50ML LL SCALE MARK (SYRINGE) ×2 IMPLANT
WATER STERILE IRR 500ML POUR (IV SOLUTION) ×1 IMPLANT

## 2021-04-06 NOTE — Anesthesia Procedure Notes (Signed)
Procedure Name: Intubation ?Date/Time: 04/06/2021 7:33 AM ?Performed by: Jonna Clark, CRNA ?Pre-anesthesia Checklist: Patient identified, Patient being monitored, Timeout performed, Emergency Drugs available and Suction available ?Patient Re-evaluated:Patient Re-evaluated prior to induction ?Oxygen Delivery Method: Circle system utilized ?Preoxygenation: Pre-oxygenation with 100% oxygen ?Induction Type: IV induction ?Ventilation: Mask ventilation without difficulty ?Laryngoscope Size: 3 and McGraph ?Grade View: Grade II ?Tube type: Oral ?Tube size: 7.5 mm ?Number of attempts: 1 ?Airway Equipment and Method: Stylet ?Placement Confirmation: ETT inserted through vocal cords under direct vision, positive ETCO2 and breath sounds checked- equal and bilateral ?Secured at: 21 cm ?Tube secured with: Tape ?Dental Injury: Teeth and Oropharynx as per pre-operative assessment  ?Difficulty Due To: Difficulty was anticipated, Difficult Airway- due to anterior larynx and Difficult Airway- due to limited oral opening ?Future Recommendations: Recommend- induction with short-acting agent, and alternative techniques readily available ? ? ? ? ?

## 2021-04-06 NOTE — Op Note (Signed)
Preoperative diagnosis: Left hydrocele (N43.3) ? ?Postoperative diagnosis: Same ? ?Procedure: Left hydrocelectomy (CPT [phone]) ? ?Surgeon: Otelia Limes. Yves Dill MD ? ?Anesthesia: General ? ?Indications: 56 year old (DOB [birth year])white male with a left hydrocele, dating back to 2022.  He was initially treated with drainage and sclerotherapy on 11/16/2020.  However, the hydrocele has since recurred and he comes in now for surgical correction of the left hydrocele.See the history and physical. After informed consent the above procedure(s) were requested ? ?   ?Technique and findings: After adequate general anesthesia been obtained perineum was prepped and draped in the usual fashion.  The hydrocele fluid was drained with an 18 [place] Angiocath.  A 3 cm midline raphae protal incision was made and carried down sharply through the skin and through the dartos fascia with the electrocautery.  The testicle and hydrocele sac were levered into the surgical field.  The hydrocele sac was then opened with the electrocautery and marsupialized posteriorly with a running and locking 3-0 chromic suture.  The testicle was then pexed back into the scrotal compartment with 3 strategically placed 3-0 chromic stitches.  Bleeders were then attended to with electrocautery.  The dartos fascia was reapproximated with running and locking 3-0 chromic suture.  The skin was reapproximated with interrupted 3-0 chromic sutures.  Left spermatic cord block and subcutaneous blocks were then performed with a mixture of quarter percent Marcaine and half percent lidocaine.  Dermabond, sterile dressing, fluffs and scrotal supporter were applied.  Sponge, needle, and instrument counts were noted to be correct.  Blood loss was minimal.  The procedure was then terminated and patient transferred to the recovery room in stable condition.  ? ?  ? ?  ?

## 2021-04-06 NOTE — Transfer of Care (Signed)
Immediate Anesthesia Transfer of Care Note ? ?[name] ? ?Procedure(s) Performed: HYDROCELECTOMY ADULT (Left) ? ?Patient Location: PACU ? ?Anesthesia Type:General ? ?Level of Consciousness: drowsy and cooperative ? ?Airway & Oxygen Therapy: Patient Spontanous Breathing and Patient connected to face mask oxygen ? ?Post-op Assessment: Report given to RN and Post -op Vital signs reviewed and stable ? ?Post vital signs: Reviewed and stable ? ?Last Vitals:  ?Vitals Value Taken Time  ?BP 114/71 04/06/21 0841  ?Temp    ?Pulse 64 04/06/21 0842  ?Resp 32 04/06/21 0842  ?SpO2 100 % 04/06/21 0842  ?Vitals shown include unvalidated device data. ? ?Last Pain:  ?Vitals:  ? 04/06/21 0621  ?TempSrc: Oral  ?PainSc: 0-No pain  ?   ? ?  ? ?Complications: No notable events documented. ?

## 2021-04-06 NOTE — H&P (Signed)
Date of Initial H&P: 03/30/21 ? ?History reviewed, patient examined, no change in status, stable for surgery. ?

## 2021-04-06 NOTE — Anesthesia Preprocedure Evaluation (Addendum)
Anesthesia Evaluation  ?Patient identified by MRN, date of birth, ID band ?Patient awake ? ? ? ?Reviewed: ?Allergy & Precautions, NPO status , Patient's Chart, lab work & pertinent test results ? ?History of Anesthesia Complications ?Negative for: history of anesthetic complications ? ?Airway ?Mallampati: III ? ? ? ? ? ? Dental ? ?(+) Poor Dentition, Chipped ?  ?Pulmonary ?sleep apnea and Continuous Positive Airway Pressure Ventilation , neg COPD, Not current smoker,  ?  ?Pulmonary exam normal ? ? ? ? ? ? ? Cardiovascular ?(-) hypertension(-) Past MI and (-) CHF Normal cardiovascular exam(-) dysrhythmias (-) Valvular Problems/Murmurs ? ? ?  ?Neuro/Psych ?neg Seizures   ? GI/Hepatic ?Neg liver ROS, GERD  Controlled and Medicated,  ?Endo/Other  ?diabetes, Well Controlled, Type 2Morbid obesity ? Renal/GU ?negative Renal ROS  ? ?  ?Musculoskeletal ? ? Abdominal ?(+) + obese,   ?Peds ? Hematology ?  ?Anesthesia Other Findings ? ? Reproductive/Obstetrics ? ?  ? ? ? ? ? ? ? ? ? ? ? ? ? ?  ?  ? ? ? ? ? ? ? ?Anesthesia Physical ? ?Anesthesia Plan ? ?ASA: III ? ?Anesthesia Plan: General  ? ?Post-op Pain Management: Regional block* and Minimal or no pain anticipated  ? ?Induction: Intravenous ? ?PONV Risk Score and Plan: 2 and Ondansetron and Dexamethasone ? ?Airway Management Planned: Oral ETT ? ?Additional Equipment:  ? ?Intra-op Plan:  ? ?Post-operative Plan:  ? ?Informed Consent: I have reviewed the patients History and Physical, chart, labs and discussed the procedure including the risks, benefits and alternatives for the proposed anesthesia with the patient or authorized representative who has indicated his/her understanding and acceptance.  ? ? ? ?Dental advisory given ? ?Plan Discussed with: CRNA and Anesthesiologist ? ?Anesthesia Plan Comments:   ? ? ? ? ? ?Anesthesia Quick Evaluation ? ?

## 2021-04-06 NOTE — Discharge Instructions (Addendum)
Hydrocelectomy, Adult, Care After ?The following information offers guidance on how to care for yourself after your procedure. Your health care provider may also give you more specific instructions. If you have problems or questions, contact your health care provider. ?What can I expect after the procedure? ?After your procedure, it is common to have: ?Mild discomfort and swelling in the pouch that holds your testicles (scrotum). ?Bruising of the scrotum. ?Follow these instructions at home: ?Medicines ?Take over-the-counter and prescription medicines only as told by your health care provider. ?Ask your health care provider if the medicine prescribed to you: ?Requires you to avoid driving or using machinery. ?Can cause constipation. You may need to take these actions to prevent or treat constipation: ?Drink enough fluid to keep your urine pale yellow. ?Take over-the-counter or prescription medicines. ?Eat foods that are high in fiber, such as beans, whole grains, and fresh fruits and vegetables. ?Limit foods that are high in fat and processed sugars, such as fried or sweet foods. ?Bathing ?Do not take baths, swim, or use a hot tub until your health care provider approves. Ask your health care provider if you may take showers. You may only be allowed to take sponge baths. ?If you were told to wear an athletic support strap (scrotal support), keep it dry. Take it off when you shower or bathe. ?Incision care ?Two stitched incisions. One is normal. The other is red with pus and infected. ? ?Follow instructions from your health care provider about how to take care of your incision. Make sure you: ?Wash your hands with soap and water for at least 20 seconds before and after you change your bandage (dressing). If soap and water are not available, use hand sanitizer. ?Change your dressing as told by your health care provider. ?Leave stitches (sutures), skin glue, or adhesive strips in place. These skin closures may need to  stay in place for 2 weeks or longer. If adhesive strip edges start to loosen and curl up, you may trim the loose edges. Do not remove adhesive strips completely unless your health care provider tells you to do that. ?Check your incision and scrotum every day for signs of infection. Check for: ?More redness, swelling, or pain. ?Fluid or blood. ?Warmth. ?Pus or a bad smell. ?Managing pain and swelling ?Bag of ice on a towel on the skin. ? ?If directed, put ice on the affected area. To do this: ?Put ice in a plastic bag. ?Place a towel between your skin and the bag. ?Leave the ice on for 20 minutes, 2-3 times per day. ?Remove the ice if your skin turns bright red. This is very important. If you cannot feel pain, heat, or cold, you have a greater risk of damage to the area. ?  ?Activity ?Do not lift anything that is heavier than 10 lb (4.5 kg) until your health care provider says that it is safe. ?If you were given a sedative during the procedure, it can affect you for several hours. Do not drive or operate machinery until your health care provider says that it is safe. ?Ask your health care provider when it is safe to drive. ?Return to your normal activities as told by your health care provider. Ask your health care provider what activities are safe for you. ?General instructions ?Do not use any products that contain nicotine or tobacco. These products include cigarettes, chewing tobacco, and vaping devices, such as e-cigarettes. These can delay healing after surgery. If you need help quitting, ask your health  care provider. ?If you were given a scrotal support, wear it as told by your health care provider. ?Keep all follow-up visits. This is important. If you had a drain put in during the procedure, you will need to have it removed at a follow-up visit. ?Contact a health care provider if: ?Your pain is not controlled with medicine. ?You have more redness, swelling, or pain around your scrotum. ?You have fluid or blood  coming from your incision. ?Your incision feels warm to the touch. ?You have pus or a bad smell coming from your incision. ?You have a fever. ?Get help right away if: ?You develop shaking, chills, and a fever that is higher than 101.8?F (38.8?C). ?You have redness or swelling that starts at your scrotum and spreads outward to your whole groin. ?You develop swelling in your legs. ?You have difficulty breathing. ?These symptoms may be an emergency. Get help right away. Call 911. ?Do not wait to see if the symptoms will go away. ?Do not drive yourself to the hospital. ?Summary ?After a hydrocelectomy, it is common to have mild discomfort, swelling, and bruising. ?Do not take baths, swim, or use a hot tub until your health care provider approves. Ask your health care provider if you may take showers. ?If directed, put ice on the affected area to help with pain and swelling. ?Do not lift anything that is heavier than 10 lb (4.5 kg) until your health care provider says that it is safe. Return to your normal activities as told by your health care provider. ?If you were given a scrotal support, keep it dry. Wear the scrotal support as told by your health care provider. ?This information is not intended to replace advice given to you by your health care provider. Make sure you discuss any questions you have with your health care provider. ?Document Revised: 09/08/2020 Document Reviewed: 09/08/2020 ?Elsevier Patient Education ? [place]. ?  ? ?AMBULATORY SURGERY  ?DISCHARGE INSTRUCTIONS ? ? ?The drugs that you were given will stay in your system until tomorrow so for the next 24 hours you should not: ? ?Drive an automobile ?Make any legal decisions ?Drink any alcoholic beverage ? ? ?You may resume regular meals tomorrow.  Today it is better to start with liquids and gradually work up to solid foods. ? ?You may eat anything you prefer, but it is better to start with liquids, then soup and crackers, and gradually work  up to solid foods. ? ? ?Please notify your doctor immediately if you have any unusual bleeding, trouble breathing, redness and pain at the surgery site, drainage, fever, or pain not relieved by medication. ? ? ? ?Additional Instructions: ? ? ? ? ? ? ? ?Please contact your physician with any problems or Same Day Surgery at [phone], Monday through Friday 6 am to 4 pm, or  at [facility] number at [phone].   ? ?

## 2021-04-07 ENCOUNTER — Encounter: Payer: Self-pay | Admitting: Urology

## 2021-04-07 ENCOUNTER — Ambulatory Visit (INDEPENDENT_AMBULATORY_CARE_PROVIDER_SITE_OTHER): Payer: Self-pay | Admitting: Internal Medicine

## 2021-04-07 ENCOUNTER — Other Ambulatory Visit: Payer: Self-pay

## 2021-04-07 VITALS — BP 117/76 | HR 72 | Temp 97.9°F | Ht 65.98 in | Wt 315.0 lb

## 2021-04-07 DIAGNOSIS — E1169 Type 2 diabetes mellitus with other specified complication: Secondary | ICD-10-CM

## 2021-04-07 DIAGNOSIS — E119 Type 2 diabetes mellitus without complications: Secondary | ICD-10-CM | POA: Diagnosis not present

## 2021-04-07 DIAGNOSIS — E785 Hyperlipidemia, unspecified: Secondary | ICD-10-CM | POA: Diagnosis not present

## 2021-04-07 LAB — BAYER DCA HB A1C WAIVED: HB A1C (BAYER DCA - WAIVED): 5.3 % (ref 4.8–5.6)

## 2021-04-07 MED ORDER — SEMAGLUTIDE (1 MG/DOSE) 4 MG/3ML ~~LOC~~ SOPN
1.0000 mg | PEN_INJECTOR | SUBCUTANEOUS | 5 refills | Status: DC
Start: 2021-04-07 — End: 2021-09-28

## 2021-04-07 NOTE — Anesthesia Postprocedure Evaluation (Signed)
Anesthesia Post Note ? ?[name] ? ?Procedure(s) Performed: HYDROCELECTOMY ADULT (Left) ? ?Patient location during evaluation: PACU ?Anesthesia Type: General ?Level of consciousness: awake and alert ?Pain management: pain level controlled ?Vital Signs Assessment: post-procedure vital signs reviewed and stable ?Respiratory status: spontaneous breathing, nonlabored ventilation and respiratory function stable ?Cardiovascular status: blood pressure returned to baseline and stable ?Postop Assessment: no apparent nausea or vomiting ?Anesthetic complications: no ? ? ?No notable events documented. ? ? ?Last Vitals:  ?Vitals:  ? 04/06/21 0915 04/06/21 0924  ?BP: 110/72 118/84  ?Pulse: 68 66  ?Resp: 12 14  ?Temp: (!) 36.1 ?C (!) 36.1 ?C  ?SpO2: 95% 99%  ?  ?Last Pain:  ?Vitals:  ? 04/06/21 0924  ?TempSrc: Temporal  ?PainSc: 0-No pain  ? ? ?  ?  ?  ?  ?  ?  ? ?[place] Ouch ? ? ? ? ?

## 2021-04-07 NOTE — Progress Notes (Signed)
? ?BP 117/76   Pulse 72   Temp 97.9 ?F (36.6 ?C) (Oral)   Ht 5' 5.98" (1.676 m)   Wt (!) 315 lb (142.9 kg)   SpO2 98%   BMI 50.87 kg/m?   ? ?Subjective:  ? ? Patient ID: Casey Velez, male    DOB: 1965-06-02, 56 y.o.   MRN: [id number] ? ?Chief Complaint  ?Patient presents with  ? Diabetes  ? Hyperlipidemia  ? ? ?HPI: ?Casey Velez is a 56 y.o. male ? ?Is on ozempic 36 lbs total loss , hasnt been excercising ? ?Diabetes ?He presents for his follow-up diabetic visit. He has type 2 diabetes mellitus. His disease course has been improving. Associated symptoms include weight loss. Pertinent negatives for diabetes include no blurred vision, no chest pain, no fatigue, no foot ulcerations, no polydipsia, no polyphagia, no polyuria, no visual change and no weakness.  ?Hyperlipidemia ?This is a chronic problem. The problem is uncontrolled. He has no history of chronic renal disease, hypothyroidism or obesity. Pertinent negatives include no chest pain.  ?Left hydrocele has had same day  ? ?Chief Complaint  ?Patient presents with  ? Diabetes  ? Hyperlipidemia  ? ? ?Relevant past medical, surgical, family and social history reviewed and updated as indicated. Interim medical history since our last visit reviewed. ?Allergies and medications reviewed and updated. ? ?Review of Systems  ?Constitutional:  Positive for weight loss. Negative for fatigue.  ?Eyes:  Negative for blurred vision.  ?Cardiovascular:  Negative for chest pain.  ?Endocrine: Negative for polydipsia, polyphagia and polyuria.  ?Neurological:  Negative for weakness.  ? ?Per HPI unless specifically indicated above ? ?   ?Objective:  ?  ?BP 117/76   Pulse 72   Temp 97.9 ?F (36.6 ?C) (Oral)   Ht 5' 5.98" (1.676 m)   Wt (!) 315 lb (142.9 kg)   SpO2 98%   BMI 50.87 kg/m?   ?Wt Readings from Last 3 Encounters:  ?04/07/21 (!) 315 lb (142.9 kg)  ?04/06/21 (!) 301 lb (136.5 kg)  ?04/03/21 (!) 301 lb (136.5 kg)  ?  ?Physical Exam ?Vitals and nursing note  reviewed.  ?Constitutional:   ?   General: He is not in acute distress. ?   Appearance: Normal appearance. He is not ill-appearing or diaphoretic.  ?HENT:  ?   Head: Normocephalic and atraumatic.  ?   Right Ear: Tympanic membrane and external ear normal. There is no impacted cerumen.  ?   Left Ear: External ear normal.  ?   Nose: No congestion or rhinorrhea.  ?   Mouth/Throat:  ?   Pharynx: No oropharyngeal exudate or posterior oropharyngeal erythema.  ?Eyes:  ?   Conjunctiva/sclera: Conjunctivae normal.  ?   Pupils: Pupils are equal, round, and reactive to light.  ?Cardiovascular:  ?   Rate and Rhythm: Normal rate and regular rhythm.  ?   Heart sounds: No murmur heard. ?  No friction rub. No gallop.  ?Pulmonary:  ?   Effort: No respiratory distress.  ?   Breath sounds: No stridor. No wheezing or rhonchi.  ?Chest:  ?   Chest wall: No tenderness.  ?Abdominal:  ?   General: Abdomen is flat. Bowel sounds are normal.  ?   Palpations: Abdomen is soft. There is no mass.  ?   Tenderness: There is no abdominal tenderness.  ?Musculoskeletal:  ?   Cervical back: Normal range of motion and neck supple. No rigidity or tenderness.  ?  Left lower leg: No edema.  ?Skin: ?   General: Skin is warm and dry.  ?Neurological:  ?   Mental Status: He is alert.  ? ? ?Results for orders placed or performed in visit on 04/07/21  ?Bayer DCA Hb A1c Waived (STAT)  ?Result Value Ref Range  ? HB A1C (BAYER DCA - WAIVED) 5.3 4.8 - 5.6 %  ? ?   ? ? ?Current Outpatient Medications:  ?  acetaminophen-codeine (TYLENOL #3) 300-30 MG tablet, Take 1-2 tablets by mouth every 4 (four) hours as needed for moderate pain., Disp: 20 tablet, Rfl: 1 ?  aspirin EC 81 MG tablet, Take 81 mg by mouth daily. Swallow whole., Disp: , Rfl:  ?  blood glucose meter kit and supplies, Dispense based on patient and insurance preference. Check glucose in the morning fasting and before meals. Use up to four times daily as directed. (FOR ICD-10 E10.9, E11.9)., Disp: 1 each,  Rfl: 0 ?  cephALEXin (KEFLEX) 500 MG capsule, Take 1 capsule (500 mg total) by mouth 4 (four) times daily., Disp: 28 capsule, Rfl: 0 ?  docusate sodium (COLACE) 100 MG capsule, Take 100 mg by mouth daily., Disp: , Rfl:  ?  famotidine (PEPCID) 20 MG tablet, Take 20 mg by mouth daily., Disp: , Rfl:  ?  loratadine (CLARITIN) 10 MG tablet, Take 10 mg by mouth daily., Disp: , Rfl:  ?  Multiple Vitamin (MULTIVITAMIN WITH MINERALS) TABS tablet, Take 1 tablet by mouth daily., Disp: , Rfl:  ?  Semaglutide, 1 MG/DOSE, 4 MG/3ML SOPN, Inject 1 mg as directed once a week., Disp: 3 mL, Rfl: 5 ?  Vitamin D, Ergocalciferol, (DRISDOL) 1.25 MG (50000 UNIT) CAPS capsule, Take 50,000 Units by mouth every Sunday., Disp: , Rfl:   ? ? ?Assessment & Plan:  ?Prediabetes / obesity :  ?Increase ozempic to 1 mg q weekly lost 36 lbs since first statted bmi still high at 50  ?Lifestyle modifications advised to pt. He has started some lifestyle changes ? Portion control and avoiding high carb low fat diet advised.  Diet plan given to pt   exercise plan given and encouraged.  To increase exercise to 150 mins a week ie 21/2 hours a week. Pt verbalises understanding of the above.  ? ? ? ?Problem List Items Addressed This Visit   ? ?  ? Endocrine  ? Diabetes mellitus without complication ([place])  ? Relevant Medications  ? Semaglutide, 1 MG/DOSE, 4 MG/3ML SOPN  ? Other Relevant Orders  ? Bayer DCA Hb A1c Waived (STAT) (Completed)  ? Hyperlipidemia associated with type 2 diabetes mellitus ([place])  ? Relevant Medications  ? Semaglutide, 1 MG/DOSE, 4 MG/3ML SOPN  ?  ? Other  ? Hyperlipidemia - Primary  ? Relevant Orders  ? Lipid Profile  ?  ? ?Orders Placed This Encounter  ?Procedures  ? Lipid Profile  ? Bayer DCA Hb A1c Waived (STAT)  ?  ? ?Meds ordered this encounter  ?Medications  ? Semaglutide, 1 MG/DOSE, 4 MG/3ML SOPN  ?  Sig: Inject 1 mg as directed once a week.  ?  Dispense:  3 mL  ?  Refill:  5  ?  ? ?Follow up plan: ?Return in about 4 weeks (around  05/05/2021). ? ?

## 2021-04-08 LAB — SPECIMEN STATUS

## 2021-04-10 MED ORDER — ATORVASTATIN CALCIUM 20 MG PO TABS: 20.0000 mg | ORAL_TABLET | Freq: Every day | ORAL | 3 refills | Status: DC

## 2021-04-11 LAB — LIPID PANEL
Chol/HDL Ratio: 5.8 ratio — ABNORMAL HIGH (ref 0.0–5.0)
Cholesterol, Total: 219 mg/dL — ABNORMAL HIGH (ref 100–199)
HDL: 38 mg/dL — ABNORMAL LOW (ref >39)
LDL Chol Calc (NIH): 153 mg/dL — ABNORMAL HIGH (ref 0–99)
Triglycerides: 156 mg/dL — ABNORMAL HIGH (ref 0–149)
VLDL Cholesterol Cal: 28 mg/dL (ref 5–40)

## 2021-04-13 ENCOUNTER — Encounter: Payer: Self-pay | Admitting: Internal Medicine

## 2021-04-17 NOTE — Telephone Encounter (Signed)
Pt has filled out Paper work on [employer] and did not qualify also checking with his insurance company declined speaking with RN CM at this time. ? ?Noreene Larsson, RMA ?Care Guide, Embedded Care Coordination ?[facility]  ?[place], [place] [address] ?Direct Dial: [phone] ?[email or web address] ?Website: [email or web address]  ? ?

## 2021-05-01 ENCOUNTER — Telehealth: Payer: Self-pay | Admitting: Internal Medicine

## 2021-05-01 NOTE — Telephone Encounter (Signed)
Britten calling from [employer] is calling to request medication assistance for the patient for ozempic. Faxed medication assistance form over on 04/26/21. Calling to check if the form was received. ? ?Please advise CB- [phone] ?

## 2021-05-01 NOTE — Telephone Encounter (Signed)
Spoke to Hedley from [employer] stating that medication assistance forms have been faxed back to the number on form that was provided.  ?

## 2021-05-11 ENCOUNTER — Ambulatory Visit: Payer: Self-pay | Admitting: Internal Medicine

## 2021-05-24 ENCOUNTER — Ambulatory Visit: Payer: Self-pay | Admitting: Internal Medicine

## 2021-05-26 ENCOUNTER — Ambulatory Visit (INDEPENDENT_AMBULATORY_CARE_PROVIDER_SITE_OTHER): Payer: Self-pay | Admitting: Internal Medicine

## 2021-05-26 ENCOUNTER — Encounter: Payer: Self-pay | Admitting: Internal Medicine

## 2021-05-26 VITALS — BP 120/70 | HR 59 | Temp 98.2°F | Ht 65.98 in | Wt 319.2 lb

## 2021-05-26 DIAGNOSIS — E1169 Type 2 diabetes mellitus with other specified complication: Secondary | ICD-10-CM

## 2021-05-26 DIAGNOSIS — E119 Type 2 diabetes mellitus without complications: Secondary | ICD-10-CM

## 2021-05-26 DIAGNOSIS — Z6841 Body Mass Index (BMI) 40.0 and over, adult: Secondary | ICD-10-CM

## 2021-05-26 DIAGNOSIS — E785 Hyperlipidemia, unspecified: Secondary | ICD-10-CM

## 2021-05-26 LAB — BAYER DCA HB A1C WAIVED: HB A1C (BAYER DCA - WAIVED): 5.9 % — ABNORMAL HIGH (ref 4.8–5.6)

## 2021-05-26 MED ORDER — METFORMIN HCL 500 MG PO TABS: 500.0000 mg | ORAL_TABLET | Freq: Two times a day (BID) | ORAL | 3 refills | Status: DC

## 2021-05-26 MED ORDER — ATORVASTATIN CALCIUM 80 MG PO TABS: 80.0000 mg | ORAL_TABLET | Freq: Every day | ORAL | 3 refills | Status: DC

## 2021-05-26 MED ORDER — EMPAGLIFLOZIN 10 MG PO TABS: 10.0000 mg | ORAL_TABLET | Freq: Every day | ORAL | 5 refills | Status: DC

## 2021-05-26 NOTE — Progress Notes (Signed)
? ?BP 120/70   Pulse (!) 59   Temp 98.2 ?F (36.8 ?C) (Oral)   Ht 5' 5.98" (1.676 m)   Wt (!) 319 lb 3.2 oz (144.8 kg)   SpO2 97%   BMI 51.55 kg/m?   ? ?Subjective:  ? ? Patient ID: Casey Velez, male    DOB: May 18, 1965, 56 y.o.   MRN: [id number] ? ?Chief Complaint  ?Patient presents with  ?? Diabetes  ?  Has not taken new dose of Ozempic since prescribed increase.   ? ? ?HPI: ?Casey Velez is a 56 y.o. male ? ?Diabetes ?He presents for his follow-up diabetic visit. He has type 2 diabetes mellitus. His disease course has been improving. Pertinent negatives for hypoglycemia include no confusion, dizziness, headaches, mood changes, nervousness/anxiousness or pallor. Pertinent negatives for diabetes include no chest pain, no fatigue, no foot paresthesias, no polydipsia, no polyphagia and no polyuria.  ? ?Chief Complaint  ?Patient presents with  ?? Diabetes  ?  Has not taken new dose of Ozempic since prescribed increase.   ? ? ?Relevant past medical, surgical, family and social history reviewed and updated as indicated. Interim medical history since our last visit reviewed. ?Allergies and medications reviewed and updated. ? ?Review of Systems  ?Constitutional:  Negative for fatigue.  ?Cardiovascular:  Negative for chest pain.  ?Endocrine: Negative for polydipsia, polyphagia and polyuria.  ?Skin:  Negative for pallor.  ?Neurological:  Negative for dizziness and headaches.  ?Psychiatric/Behavioral:  Negative for confusion. The patient is not nervous/anxious.   ? ?Per HPI unless specifically indicated above ? ?   ?Objective:  ?  ?BP 120/70   Pulse (!) 59   Temp 98.2 ?F (36.8 ?C) (Oral)   Ht 5' 5.98" (1.676 m)   Wt (!) 319 lb 3.2 oz (144.8 kg)   SpO2 97%   BMI 51.55 kg/m?   ?Wt Readings from Last 3 Encounters:  ?05/26/21 (!) 319 lb 3.2 oz (144.8 kg)  ?04/07/21 (!) 315 lb (142.9 kg)  ?04/06/21 (!) 301 lb (136.5 kg)  ?  ?Physical Exam ? ?Results for orders placed or performed in visit on 04/07/21  ?Bayer DCA Hb  A1c Waived (STAT)  ?Result Value Ref Range  ? HB A1C (BAYER DCA - WAIVED) 5.3 4.8 - 5.6 %  ?Lipid Profile  ?Result Value Ref Range  ? Cholesterol, Total 219 (H) 100 - 199 mg/dL  ? Triglycerides 156 (H) 0 - 149 mg/dL  ? HDL 38 (L) >39 mg/dL  ? VLDL Cholesterol Cal 28 5 - 40 mg/dL  ? LDL Chol Calc (NIH) 153 (H) 0 - 99 mg/dL  ? Chol/HDL Ratio 5.8 (H) 0.0 - 5.0 ratio  ?Specimen Status  ?Result Value Ref Range  ? WBC WILL FOLLOW   ? RBC WILL FOLLOW   ? Hemoglobin WILL FOLLOW   ? Hematocrit WILL FOLLOW   ? MCV WILL FOLLOW   ? MCH WILL FOLLOW   ? MCHC WILL FOLLOW   ? RDW WILL FOLLOW   ? Platelets WILL FOLLOW   ? Neutrophils WILL FOLLOW   ? Lymphs WILL FOLLOW   ? Monocytes WILL FOLLOW   ? Eos WILL FOLLOW   ? Basos WILL FOLLOW   ? Neutrophils Absolute WILL FOLLOW   ? Lymphocytes Absolute WILL FOLLOW   ? Monocytes Absolute WILL FOLLOW   ? EOS (ABSOLUTE) WILL FOLLOW   ? Basophils Absolute WILL FOLLOW   ? Immature Granulocytes WILL FOLLOW   ? Immature Grans (Abs) WILL FOLLOW   ? ?   ? ? ?  Current Outpatient Medications:  ??  aspirin EC 81 MG tablet, Take 81 mg by mouth daily. Swallow whole., Disp: , Rfl:  ??  blood glucose meter kit and supplies, Dispense based on patient and insurance preference. Check glucose in the morning fasting and before meals. Use up to four times daily as directed. (FOR ICD-10 E10.9, E11.9)., Disp: 1 each, Rfl: 0 ??  docusate sodium (COLACE) 100 MG capsule, Take 100 mg by mouth daily., Disp: , Rfl:  ??  empagliflozin (JARDIANCE) 10 MG TABS tablet, Take 1 tablet (10 mg total) by mouth daily., Disp: 30 tablet, Rfl: 5 ??  famotidine (PEPCID) 20 MG tablet, Take 20 mg by mouth daily., Disp: , Rfl:  ??  loratadine (CLARITIN) 10 MG tablet, Take 10 mg by mouth daily., Disp: , Rfl:  ??  metFORMIN (GLUCOPHAGE) 500 MG tablet, Take 1 tablet (500 mg total) by mouth 2 (two) times daily with a meal., Disp: 60 tablet, Rfl: 3 ??  Multiple Vitamin (MULTIVITAMIN WITH MINERALS) TABS tablet, Take 1 tablet by mouth daily.,  Disp: , Rfl:  ??  Vitamin D, Ergocalciferol, (DRISDOL) 1.25 MG (50000 UNIT) CAPS capsule, Take 50,000 Units by mouth every Sunday., Disp: , Rfl:  ??  atorvastatin (LIPITOR) 80 MG tablet, Take 1 tablet (80 mg total) by mouth daily., Disp: 90 tablet, Rfl: 3  ? ? ?Assessment & Plan:  ?Pt cannot afford ozempic says pts assistance doesn't work anymore. ? ? ?Dm start metformin 500 mg bid for now and add jardiance.  ?338 -- was down to 302 now at 314 at home. Cannot afford ozempic. ?check HbA1c,  urine  microalbumin  diabetic diet plan given to pt  adviced regarding hypoglycemia and instructions given to pt today on how to prevent and treat the same if it were to occur. pt acknowledges the plan and voices understanding of the same.  exercise plan given and encouraged.   advice diabetic yearly podiatry, ophthalmology , nutritionist , dental check q 6 months. ? ? ?HLD L  ? Latest Reference Range & Units 04/04/21 08:16 04/07/21 08:56  ?Total CHOL/HDL Ratio 0.0 - 5.0 ratio 5.2 (H) 5.8 (H)  ?Cholesterol, Total 100 - 199 mg/dL 192 219 (H)  ?HDL Cholesterol >39 mg/dL 37 (L) 38 (L)  ?Triglycerides 0 - 149 mg/dL 182 (H) 156 (H)  ?VLDL Cholesterol Cal 5 - 40 mg/dL 32 28  ?LDL Chol Calc (NIH) 0 - 99 mg/dL 123 (H) 153 (H)  ?(H): Data is abnormally high ?(L): Data is abnormally low ? ?Problem List Items Addressed This Visit   ? ?  ? Endocrine  ? Diabetes mellitus without complication ([place])  ? Relevant Medications  ? metFORMIN (GLUCOPHAGE) 500 MG tablet  ? empagliflozin (JARDIANCE) 10 MG TABS tablet  ? atorvastatin (LIPITOR) 80 MG tablet  ? Other Relevant Orders  ? Bayer DCA Hb A1c Waived (STAT)  ? Hyperlipidemia associated with type 2 diabetes mellitus ([place])  ? Relevant Medications  ? metFORMIN (GLUCOPHAGE) 500 MG tablet  ? empagliflozin (JARDIANCE) 10 MG TABS tablet  ? atorvastatin (LIPITOR) 80 MG tablet  ?  ? Other  ? Body mass index (BMI) of 50-59.9 in adult [facility]) - Primary  ? Relevant Medications  ? metFORMIN (GLUCOPHAGE) 500 MG  tablet  ? empagliflozin (JARDIANCE) 10 MG TABS tablet  ? Hyperlipidemia  ? Relevant Medications  ? atorvastatin (LIPITOR) 80 MG tablet  ?  ? ?Orders Placed This Encounter  ?Procedures  ?? Bayer DCA Hb A1c Waived (STAT)  ?  ? ?  Meds ordered this encounter  ?Medications  ?? metFORMIN (GLUCOPHAGE) 500 MG tablet  ?  Sig: Take 1 tablet (500 mg total) by mouth 2 (two) times daily with a meal.  ?  Dispense:  60 tablet  ?  Refill:  3  ?? empagliflozin (JARDIANCE) 10 MG TABS tablet  ?  Sig: Take 1 tablet (10 mg total) by mouth daily.  ?  Dispense:  30 tablet  ?  Refill:  5  ?? atorvastatin (LIPITOR) 80 MG tablet  ?  Sig: Take 1 tablet (80 mg total) by mouth daily.  ?  Dispense:  90 tablet  ?  Refill:  3  ?  ? ?Follow up plan: ?No follow-ups on file. ? ? ? ?

## 2021-05-29 ENCOUNTER — Encounter: Payer: Self-pay | Admitting: Internal Medicine

## 2021-06-08 NOTE — Telephone Encounter (Signed)
Needs to be seen

## 2021-06-22 ENCOUNTER — Telehealth: Payer: Self-pay

## 2021-06-22 ENCOUNTER — Ambulatory Visit: Payer: Self-pay

## 2021-06-22 NOTE — Telephone Encounter (Signed)
  Pt. Asking if needs to take Jardiance with Ozempic. Please advise.  Answer Assessment - Initial Assessment Questions 1. NAME of MEDICATION: "What medicine are you calling about?"     Jardiance  2. QUESTION: "What is your question?" (e.g., double dose of medicine, side effect)     Should I take it with Ozempic? 3. PRESCRIBING HCP: "Who prescribed it?" Reason: if prescribed by specialist, call should be referred to that group.     Dr. Neomia Dear 4. SYMPTOMS: "Do you have any symptoms?"     N/a 5. SEVERITY: If symptoms are present, ask "Are they mild, moderate or severe?"     N/a 6. PREGNANCY:  "Is there any chance that you are pregnant?" "When was your last menstrual period?"     N/a  Protocols used: Medication Question Call-A-AH

## 2021-06-22 NOTE — Telephone Encounter (Signed)
4 boxes of Ozempic received for the patient. Called and LVM notifying patient that medication was delivered and ready to be picked up.

## 2021-06-22 NOTE — Telephone Encounter (Signed)
Routing to provider, please advise.

## 2021-06-22 NOTE — Telephone Encounter (Signed)
Pt called in asking if he should stop the jardiance  med and start taking the Ozempic now. Please call back   Left message to call back.

## 2021-06-23 NOTE — Telephone Encounter (Signed)
He should continue with the Jardiance and take the Ozempic.

## 2021-06-23 NOTE — Telephone Encounter (Signed)
Patient has been notified, no further questions at this time.

## 2021-06-26 NOTE — Telephone Encounter (Signed)
Called and notified patient that his medication was here and ready to be picked up. Patient states he will be by here tomorrow morning for pick up.

## 2021-06-28 NOTE — Telephone Encounter (Signed)
Medication picked up by the patient.  

## 2021-08-28 ENCOUNTER — Ambulatory Visit (INDEPENDENT_AMBULATORY_CARE_PROVIDER_SITE_OTHER): Payer: Self-pay | Admitting: Physician Assistant

## 2021-08-28 ENCOUNTER — Encounter: Payer: Self-pay | Admitting: Physician Assistant

## 2021-08-28 VITALS — BP 108/73 | HR 77 | Temp 98.0°F | Ht 65.98 in | Wt 311.9 lb

## 2021-08-28 DIAGNOSIS — Z6841 Body Mass Index (BMI) 40.0 and over, adult: Secondary | ICD-10-CM

## 2021-08-28 DIAGNOSIS — E785 Hyperlipidemia, unspecified: Secondary | ICD-10-CM

## 2021-08-28 DIAGNOSIS — E1169 Type 2 diabetes mellitus with other specified complication: Secondary | ICD-10-CM

## 2021-08-28 DIAGNOSIS — E119 Type 2 diabetes mellitus without complications: Secondary | ICD-10-CM

## 2021-08-28 LAB — BAYER DCA HB A1C WAIVED: HB A1C (BAYER DCA - WAIVED): 5.6 % (ref 4.8–5.6)

## 2021-08-28 NOTE — Assessment & Plan Note (Signed)
Chronic, historic condition Previously treated with Atorvastatin 80 mg PO QD but patient is unsure if he is still taking.  Recommend he resume if not currently taking to get cholesterol in goal  Patient agrees to check medications at home so we can update Will recheck lipid panel today  Follow up in 3 months.

## 2021-08-28 NOTE — Assessment & Plan Note (Signed)
Chronic, historic condition He states he has gotten back on Ozempic but is unsure of current dose. He will update [place] when he gets home Continue Ozempic for DM as this is also beneficial to weight loss.  Reviewed diet and exercise- recommend he try to incorporate light aerobic exercise to daily regimen as able Follow up in 3 months or sooner if concerns arise.

## 2021-08-28 NOTE — Patient Instructions (Addendum)
Please update Casey Velez on your current medications when you get home so we can update your records  We will keep you updated on the results of your labs as they are available  Please schedule a diabetic eye exam and have the office send Casey Velez the results once this is complete.

## 2021-08-28 NOTE — Progress Notes (Signed)
Established Patient Office Visit  Name: Casey Velez   MRN: [id number]    DOB: Oct 04, 1965   Date:08/28/2021  Today's Provider: Talitha Givens, MHS, PA-C Introduced myself to the patient as a PA-C and provided education on APPs in clinical practice.         Subjective  Chief Complaint  Chief Complaint  Patient presents with   Diabetes   Hyperlipidemia   morbid obesity    HPI  Diabetes, Type 2 States he is back on Ozempic with new insurance coverage  - Last A1c 5.9 in April 2023 - Medications: Metformin 500 mg PO BID, Jardiance 10 mg PO QD, Ozempic  - Compliance: good  - Checking BG at home: checking every other day. 110s on avg. Denies any results under 70 - Diet: Good days and bad days. Trying to reduce sugar intake- using splenda as a replacement. Drinks tea predominantly, snack crackers for breakfast, lunch is usually fast food- tries to get healthier options, dinner is usually soup and sandwich at home.  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  - Eye exam: Discussed importance of annual diabetic eye exams - Foot exam: Completed today - Microalbumin:  - Statin: Currently on Atorvastatin  - PNA vaccine: not done yet - Denies symptoms of hypoglycemia, polyuria, polydipsia, numbness extremities, foot ulcers/trauma   HYPERLIPIDEMIA Hyperlipidemia status: fair compliance Satisfied with current treatment?  yes Side effects:  no Medication compliance: fair compliance Past cholesterol meds: atorvastain (lipitor)- unsure if he is still taking this  Supplements: none Aspirin:  yes The 10-year ASCVD risk score (Arnett DK, et al., 2019) is: 10.3%   Values used to calculate the score:     Age: 56 years     Sex: Male     Is Non-Hispanic African American: No     Diabetic: Yes     Tobacco smoker: No     Systolic Blood Pressure: 497 mmHg     Is BP treated: No     HDL Cholesterol: 38 mg/dL     Total Cholesterol: 219  mg/dL  Chest pain:  no Coronary artery disease:  no Family history CAD:  Unsure of family hx Family history early CAD:  no   BMI of 50-59.9  Previous management: Ozempic, has resumed this and has lost about 8 lbs since resuming Diet :  Exercise Not currently      Patient Active Problem List   Diagnosis Date Noted   Hyperlipidemia 04/07/2021   Need for influenza vaccination 01/04/2021   Hyperlipidemia associated with type 2 diabetes mellitus ([place]) 01/04/2021   Morbid obesity ([place]) 01/04/2021   Personal history of colonic polyps    Cecal polyp    Polyp of descending colon    Polyp of sigmoid colon    Adenomatous polyp of transverse colon    Diabetes mellitus without complication ([place]) 02/63/7858   Elevated serum GGT level 05/23/2020   Vitamin D deficiency 05/23/2020   Screening for malignant neoplasm of prostate 04/15/2020   Fatigue 04/15/2020   Body mass index (BMI) of 50-59.9 in adult ([place]) 04/15/2020   Single episode of elevated blood pressure 04/15/2020    Past Surgical History:  Procedure Laterality Date   APPENDECTOMY     COLONOSCOPY WITH PROPOFOL  04/26/2017   COLONOSCOPY WITH PROPOFOL N/A 05/27/2020   Procedure: COLONOSCOPY WITH PROPOFOL;  Surgeon: Virgel Manifold, MD;  Location: [facility] ENDOSCOPY;  Service: Endoscopy;  Laterality:  N/A;   HYDROCELE EXCISION Left 04/06/2021   Procedure: HYDROCELECTOMY ADULT;  Surgeon: Royston Cowper, MD;  Location: [facility] ORS;  Service: Urology;  Laterality: Left;    Family History  Problem Relation Age of Onset   Diabetes Father    Glaucoma Father    Cancer Paternal Grandfather     Social History   Tobacco Use   Smoking status: Never   Smokeless tobacco: Former    Types: Chew   Tobacco comments:    High school  Substance Use Topics   Alcohol use: Yes    Comment: rarely     Current Outpatient Medications:    aspirin EC 81 MG tablet, Take 81 mg by mouth daily. Swallow whole., Disp: , Rfl:    atorvastatin  (LIPITOR) 80 MG tablet, Take 1 tablet (80 mg total) by mouth daily., Disp: 90 tablet, Rfl: 3   blood glucose meter kit and supplies, Dispense based on patient and insurance preference. Check glucose in the morning fasting and before meals. Use up to four times daily as directed. (FOR ICD-10 E10.9, E11.9)., Disp: 1 each, Rfl: 0   docusate sodium (COLACE) 100 MG capsule, Take 100 mg by mouth daily., Disp: , Rfl:    empagliflozin (JARDIANCE) 10 MG TABS tablet, Take 1 tablet (10 mg total) by mouth daily., Disp: 30 tablet, Rfl: 5   famotidine (PEPCID) 20 MG tablet, Take 20 mg by mouth daily., Disp: , Rfl:    loratadine (CLARITIN) 10 MG tablet, Take 10 mg by mouth daily., Disp: , Rfl:    metFORMIN (GLUCOPHAGE) 500 MG tablet, Take 1 tablet (500 mg total) by mouth 2 (two) times daily with a meal., Disp: 60 tablet, Rfl: 3   Multiple Vitamin (MULTIVITAMIN WITH MINERALS) TABS tablet, Take 1 tablet by mouth daily., Disp: , Rfl:    Vitamin D, Ergocalciferol, (DRISDOL) 1.25 MG (50000 UNIT) CAPS capsule, Take 50,000 Units by mouth every Sunday., Disp: , Rfl:   No Known Allergies  I personally reviewed active problem list, medication list, allergies, health maintenance, notes from last encounter, lab results with the patient/caregiver today.   Review of Systems  Constitutional:  Positive for weight loss (expected with medications). Negative for chills and fever.  Eyes:  Negative for blurred vision and double vision.  Respiratory:  Negative for cough, shortness of breath and wheezing.   Cardiovascular:  Negative for chest pain, palpitations and leg swelling.  Gastrointestinal:  Negative for constipation, diarrhea, heartburn, nausea and vomiting.  Neurological:  Negative for dizziness, tingling, tremors and headaches.  Endo/Heme/Allergies:  Negative for polydipsia.      Objective  Vitals:   08/28/21 0826  BP: 108/73  Pulse: 77  Temp: 98 F (36.7 C)  TempSrc: Oral  SpO2: 97%  Weight: (!) 311 lb  14.4 oz (141.5 kg)  Height: 5' 5.98" (1.676 m)    Body mass index is 50.37 kg/m.  Physical Exam Vitals reviewed.  Constitutional:      General: He is awake.     Appearance: Normal appearance. He is well-developed and well-groomed. He is morbidly obese.  HENT:     Head: Normocephalic and atraumatic.  Eyes:     General: Lids are normal. Vision grossly intact.     Extraocular Movements: Extraocular movements intact.     Conjunctiva/sclera: Conjunctivae normal.  Cardiovascular:     Rate and Rhythm: Normal rate and regular rhythm.     Pulses: Normal pulses.          Radial pulses are  2+ on the right side and 2+ on the left side.       Dorsalis pedis pulses are 2+ on the right side and 2+ on the left side.       Posterior tibial pulses are 2+ on the right side and 2+ on the left side.     Heart sounds: Normal heart sounds. No murmur heard.    No friction rub. No gallop.  Pulmonary:     Effort: Pulmonary effort is normal.     Breath sounds: Normal breath sounds. No decreased air movement. No decreased breath sounds, wheezing, rhonchi or rales.  Musculoskeletal:     Right lower leg: No edema.     Left lower leg: No edema.  Neurological:     General: No focal deficit present.     Mental Status: He is alert and oriented to person, place, and time.     GCS: GCS eye subscore is 4. GCS verbal subscore is 5. GCS motor subscore is 6.     Cranial Nerves: Cranial nerves 2-12 are intact. No dysarthria or facial asymmetry.     Motor: Motor function is intact. No weakness, tremor or abnormal muscle tone.  Psychiatric:        Attention and Perception: Attention and perception normal.        Mood and Affect: Mood and affect normal.        Speech: Speech normal.        Behavior: Behavior normal. Behavior is cooperative.      Recent Results (from the past 2160 hour(s))  Bayer DCA Hb A1c Waived (STAT)     Status: None   Collection Time: 08/28/21  8:35 AM  Result Value Ref Range   HB A1C  (BAYER DCA - WAIVED) 5.6 4.8 - 5.6 %    Comment:          Prediabetes: 5.7 - 6.4          Diabetes: >6.4          Glycemic control for adults with diabetes: <7.0      PHQ2/9:    08/28/2021    8:36 AM 05/26/2021    8:45 AM 04/07/2021    8:24 AM 02/07/2021    3:47 PM 01/04/2021    4:47 PM  Depression screen PHQ 2/9  Decreased Interest 0 0 0 0 0  Down, Depressed, Hopeless 0 0 0 0 0  PHQ - 2 Score 0 0 0 0 0  Altered sleeping 0 1 0 1 1  Tired, decreased energy 0 0 0 0 0  Change in appetite 0 0 0 0 0  Feeling bad or failure about yourself  0 0 0 0 0  Trouble concentrating 0 0 0 0 0  Moving slowly or fidgety/restless 0 0 0 0 0  Suicidal thoughts 0 0 0 0 0  PHQ-9 Score 0 1 0 1 1  Difficult doing work/chores Not difficult at all Not difficult at all Not difficult at all Not difficult at all       Fall Risk:    08/28/2021    8:36 AM 05/26/2021    8:45 AM 04/07/2021    8:24 AM 02/07/2021    4:09 PM 01/04/2021    1:25 PM  Fall Risk   Falls in the past year? 0 0 0 0 0  Number falls in past yr: 0 0 0 0 0  Injury with Fall? 0 0 0 0 0  Risk for fall due to :  No Fall Risks No Fall Risks No Fall Risks    Follow up Falls evaluation completed Falls evaluation completed Falls evaluation completed        Functional Status Survey:      Assessment & Plan  Problem List Items Addressed This Visit       Endocrine   Diabetes mellitus without complication ([place]) - Primary    Chronic, historic condition, appears well controlled States he is taking Metformin, Jardiance, and has restarted Ozempic now that his insurance coverage is allowing it.  Will recheck A1c- 5.6% today States home glucose readings are typically in 110s - lowest has been 89 once Will have patient confirm doses of Ozempic after visit so we can update med list Continue current medications Follow up in 3 months or sooner if concerns arise       Relevant Orders   Bayer DCA Hb A1c Waived (STAT) (Completed)   Comp Met (CMET)    Hyperlipidemia associated with type 2 diabetes mellitus ([facility])    Chronic, historic condition Previously treated with Atorvastatin 80 mg PO QD but patient is unsure if he is still taking.  Recommend he resume if not currently taking to get cholesterol in goal  Patient agrees to check medications at home so we can update Will recheck lipid panel today  Follow up in 3 months.       Relevant Orders   Lipid Profile     Other   Body mass index (BMI) of 50-59.9 in adult [facility])    Chronic, historic condition He states he has gotten back on Ozempic but is unsure of current dose. He will update [place] when he gets home Continue Ozempic for DM as this is also beneficial to weight loss.  Reviewed diet and exercise- recommend he try to incorporate light aerobic exercise to daily regimen as able Follow up in 3 months or sooner if concerns arise.        Return in about 3 months (around 11/28/2021) for Diabetes follow up.   I, Lunetta Marina E Alsha Meland, PA-C, have reviewed all documentation for this visit. The documentation on 08/28/21 for the exam, diagnosis, procedures, and orders are all accurate and complete.   Talitha Givens, MHS, PA-C [facility]

## 2021-08-28 NOTE — Assessment & Plan Note (Addendum)
Chronic, historic condition, appears well controlled States he is taking Metformin, Jardiance, and has restarted Ozempic now that his insurance coverage is allowing it.  Will recheck A1c- 5.6% today States home glucose readings are typically in 110s - lowest has been 89 once Will have patient confirm doses of Ozempic after visit so we can update med list Continue current medications Follow up in 3 months or sooner if concerns arise

## 2021-08-29 LAB — COMPREHENSIVE METABOLIC PANEL
ALT: 34 IU/L (ref 0–44)
AST: 34 IU/L (ref 0–40)
Albumin/Globulin Ratio: 1.5 (ref 1.2–2.2)
Albumin: 4.2 g/dL (ref 3.8–4.9)
Alkaline Phosphatase: 108 IU/L (ref 44–121)
BUN/Creatinine Ratio: 12 (ref 9–20)
BUN: 12 mg/dL (ref 6–24)
Bilirubin Total: 0.3 mg/dL (ref 0.0–1.2)
CO2: 20 mmol/L (ref 20–29)
Calcium: 9.2 mg/dL (ref 8.7–10.2)
Chloride: 107 mmol/L — ABNORMAL HIGH (ref 96–106)
Creatinine, Ser: 1 mg/dL (ref 0.76–1.27)
Globulin, Total: 2.8 g/dL (ref 1.5–4.5)
Glucose: 111 mg/dL — ABNORMAL HIGH (ref 70–99)
Potassium: 4.6 mmol/L (ref 3.5–5.2)
Sodium: 142 mmol/L (ref 134–144)
Total Protein: 7 g/dL (ref 6.0–8.5)
eGFR: 89 mL/min/{1.73_m2} (ref >59)

## 2021-08-29 LAB — LIPID PANEL
Chol/HDL Ratio: 3.3 ratio (ref 0.0–5.0)
Cholesterol, Total: 120 mg/dL (ref 100–199)
HDL: 36 mg/dL — ABNORMAL LOW (ref >39)
LDL Chol Calc (NIH): 53 mg/dL (ref 0–99)
Triglycerides: 185 mg/dL — ABNORMAL HIGH (ref 0–149)
VLDL Cholesterol Cal: 31 mg/dL (ref 5–40)

## 2021-08-30 ENCOUNTER — Encounter: Payer: Self-pay | Admitting: Physician Assistant

## 2021-09-28 ENCOUNTER — Other Ambulatory Visit: Payer: Self-pay

## 2021-09-28 DIAGNOSIS — E1169 Type 2 diabetes mellitus with other specified complication: Secondary | ICD-10-CM

## 2021-09-28 DIAGNOSIS — E785 Hyperlipidemia, unspecified: Secondary | ICD-10-CM

## 2021-09-28 DIAGNOSIS — E119 Type 2 diabetes mellitus without complications: Secondary | ICD-10-CM

## 2021-10-02 ENCOUNTER — Telehealth: Payer: Self-pay

## 2021-10-02 NOTE — Telephone Encounter (Signed)
Patient assistance forms have been filled out and signed by K. Holdsworth. Forms have been placed in pharmacy folder.

## 2021-10-02 NOTE — Telephone Encounter (Signed)
Ozempic received for the patient. Called and LVM notifying patient that the medication was here and ready to be picked up.

## 2021-10-02 NOTE — Telephone Encounter (Signed)
Noted  

## 2021-10-17 ENCOUNTER — Encounter: Payer: Self-pay | Admitting: Internal Medicine

## 2021-11-01 ENCOUNTER — Encounter: Payer: Self-pay | Admitting: Physician Assistant

## 2021-11-03 NOTE — Telephone Encounter (Signed)
Called patient to schedule an appointment with Dr. Wynetta Emery. I was unable to leave a vm due to a full mailbox.

## 2021-11-16 ENCOUNTER — Encounter: Payer: Self-pay | Admitting: Family Medicine

## 2021-11-16 ENCOUNTER — Ambulatory Visit (INDEPENDENT_AMBULATORY_CARE_PROVIDER_SITE_OTHER): Payer: 59 | Admitting: Family Medicine

## 2021-11-16 ENCOUNTER — Encounter: Payer: Self-pay | Admitting: Internal Medicine

## 2021-11-16 VITALS — BP 119/81 | HR 71 | Temp 98.1°F | Wt 319.2 lb

## 2021-11-16 DIAGNOSIS — L600 Ingrowing nail: Secondary | ICD-10-CM

## 2021-11-16 NOTE — Progress Notes (Signed)
BP 119/81   Pulse 71   Temp 98.1 F (36.7 C)   Wt (!) 319 lb 3.2 oz (144.8 kg)   SpO2 100%   BMI 51.55 kg/m    Subjective:    Patient ID: Casey Velez, male    DOB: [birth year], 56 y.o.   MRN: [id number]  HPI: Casey LAMARTINA is a 56 y.o. male  Chief Complaint  Patient presents with   INGROWN TOE NAIL    Patient states he has an ingrown toenail on his left foot, big toe. Patient states toe has been red, swollen, and painful for about 2-3 weeks.    TOE PAIN Duration: 2-3 week Involved toe: leftbig toe  Mechanism of injury: unknown Onset: gradual Severity: severe  Quality: aching and sore Frequency: constant Radiation: no Aggravating factors: shoes  Alleviating factors: soaking  Status: stable Treatments attempted: soaks, trying to cut them out  Relief with NSAIDs?: no Morning stiffness: no Redness: yes  Bruising: no Swelling: yes Paresthesias / decreased sensation: no Fevers: no  Relevant past medical, surgical, family and social history reviewed and updated as indicated. Interim medical history since our last visit reviewed. Allergies and medications reviewed and updated.  Review of Systems  Constitutional: Negative.   Respiratory: Negative.    Cardiovascular: Negative.   Gastrointestinal: Negative.   Musculoskeletal: Negative.   Skin:  Positive for color change. Negative for pallor, rash and wound.  Neurological: Negative.   Psychiatric/Behavioral: Negative.      Per HPI unless specifically indicated above     Objective:    BP 119/81   Pulse 71   Temp 98.1 F (36.7 C)   Wt (!) 319 lb 3.2 oz (144.8 kg)   SpO2 100%   BMI 51.55 kg/m   Wt Readings from Last 3 Encounters:  11/16/21 (!) 319 lb 3.2 oz (144.8 kg)  08/28/21 (!) 311 lb 14.4 oz (141.5 kg)  05/26/21 (!) 319 lb 3.2 oz (144.8 kg)    Physical Exam Vitals and nursing note reviewed.  Constitutional:      General: He is not in acute distress.    Appearance: Normal appearance. He is  obese. He is not ill-appearing, toxic-appearing or diaphoretic.  HENT:     Head: Normocephalic and atraumatic.     Right Ear: External ear normal.     Left Ear: External ear normal.     Nose: Nose normal.     Mouth/Throat:     Mouth: Mucous membranes are moist.     Pharynx: Oropharynx is clear.  Eyes:     General: No scleral icterus.       Right eye: No discharge.        Left eye: No discharge.     Extraocular Movements: Extraocular movements intact.     Conjunctiva/sclera: Conjunctivae normal.     Pupils: Pupils are equal, round, and reactive to light.  Cardiovascular:     Rate and Rhythm: Normal rate and regular rhythm.     Pulses: Normal pulses.     Heart sounds: Normal heart sounds. No murmur heard.    No friction rub. No gallop.  Pulmonary:     Effort: Pulmonary effort is normal. No respiratory distress.     Breath sounds: Normal breath sounds. No stridor. No wheezing, rhonchi or rales.  Chest:     Chest wall: No tenderness.  Musculoskeletal:        General: Normal range of motion.     Cervical back: Normal range of motion  and neck supple.  Skin:    General: Skin is warm and dry.     Capillary Refill: Capillary refill takes less than 2 seconds.     Coloration: Skin is not jaundiced or pale.     Findings: No bruising, erythema, lesion or rash.     Comments: Ingrown nail both sides of L great toenail  Neurological:     General: No focal deficit present.     Mental Status: He is alert and oriented to person, place, and time. Mental status is at baseline.  Psychiatric:        Mood and Affect: Mood normal.        Behavior: Behavior normal.        Thought Content: Thought content normal.        Judgment: Judgment normal.     Results for orders placed or performed in visit on 08/28/21  Bayer DCA Hb A1c Waived (STAT)  Result Value Ref Range   HB A1C (BAYER DCA - WAIVED) 5.6 4.8 - 5.6 %  Comp Met (CMET)  Result Value Ref Range   Glucose 111 (H) 70 - 99 mg/dL   BUN 12  6 - 24 mg/dL   Creatinine, Ser 1.00 0.76 - 1.27 mg/dL   eGFR 89 >59 mL/min/1.73   BUN/Creatinine Ratio 12 9 - 20   Sodium 142 134 - 144 mmol/L   Potassium 4.6 3.5 - 5.2 mmol/L   Chloride 107 (H) 96 - 106 mmol/L   CO2 20 20 - 29 mmol/L   Calcium 9.2 8.7 - 10.2 mg/dL   Total Protein 7.0 6.0 - 8.5 g/dL   Albumin 4.2 3.8 - 4.9 g/dL   Globulin, Total 2.8 1.5 - 4.5 g/dL   Albumin/Globulin Ratio 1.5 1.2 - 2.2   Bilirubin Total 0.3 0.0 - 1.2 mg/dL   Alkaline Phosphatase 108 44 - 121 IU/L   AST 34 0 - 40 IU/L   ALT 34 0 - 44 IU/L  Lipid Profile  Result Value Ref Range   Cholesterol, Total 120 100 - 199 mg/dL   Triglycerides 185 (H) 0 - 149 mg/dL   HDL 36 (L) >39 mg/dL   VLDL Cholesterol Cal 31 5 - 40 mg/dL   LDL Chol Calc (NIH) 53 0 - 99 mg/dL   Chol/HDL Ratio 3.3 0.0 - 5.0 ratio      Assessment & Plan:   Problem List Items Addressed This Visit   None Visit Diagnoses     Ingrown nail of great toe of left foot    -  Primary   Bilateral sides of nail removed as below.       Procedure: Partial Toenail removal with Matrix Diagnosis:    ICD-10-CM   1. Ingrown nail of great toe of left foot  L60.0    Bilateral sides of nail removed as below.     Physican: Park Liter, DO Consent: Risks, benefits, and alternative treatments discussed and all questions were answered.  Patient elected to proceed and verbal consent obtained Description:  Area prepped and draped using  sterile technique. Digital block of the  Left  1st toe performed by injecting local anesthetic at the base of the toe at the 2 oclock and 10 oclock positions, using 5 cc's of  1% lidocaine plain. After confirming adequate anesthesia, lateral and medial nail folds and epinychia were freed up using periosteal elevator.  Using scissors the nail was vertically cut beyond the epinychia to the base.  A hemostat was then  used to remove the nail fragment. The nail was grasped using a hemostat and the nail was removed intact.  Phenol was applied to the nail matrix x 3 using a cotton applicator tip.   Bacitracin ointment was applied to the operative site a circumferential gauze dressive was applied.  The patient tolerated the procedure well.  Complications: none Estimated Blood Loss: minimal Post Procedure Instructions: The patient was encouraged to keep the dressing in place for 24 hours and keep the foot elevated as much as possible during this time.  After the first day they are instructed to soak the toe in warm water 3 times daily for 3-4 days.  Antibiotic ointment is to be applied daily for 1 week.  The patient was informed that some oozing is to be expected for 1-2 weeks but that they should return immediately for pus, increased pain or redness.  They were instructed to take APAP or motrin as needed for post operative discomfort.    Follow up plan: Return if symptoms worsen or fail to improve.

## 2021-11-27 NOTE — Progress Notes (Signed)
Established Patient Office Visit  Name: Casey Velez   MRN: [id number]    DOB: April 14, 1965   Date:11/28/2021  Today's Provider: Talitha Givens, MHS, PA-C Introduced myself to the patient as a PA-C and provided education on APPs in clinical practice.         Subjective  Chief Complaint  Chief Complaint  Patient presents with   Diabetes    HPI  Diabetes, Type 2 - Last A1c 5.6 - Medications: Jardiance 10 mg PO QD, Ozempic 1 mg/ dose injection once weekly, Metformin 500 mg PO BID - Compliance: excellent - Checking BG at home: checking at home. This AM was 110 - highest this week was 114  - Eye exam: has this scheduled for next month  - Foot exam: Completed and up to date - Microalbumin: up to date  - Statin: on statin therapy  - PNA vaccine:  - Denies symptoms of hypoglycemia, polyuria, polydipsia, numbness extremities, foot ulcers/trauma  HYPERLIPIDEMIA Hyperlipidemia status: excellent compliance Satisfied with current treatment?  yes Side effects:  no Medication compliance: excellent compliance Past cholesterol meds: atorvastain (lipitor) Supplements: none Aspirin:  no The ASCVD Risk score (Arnett DK, et al., 2019) failed to calculate for the following reasons:   The valid total cholesterol range is 130 to 320 mg/dL Chest pain:  no Coronary artery disease:   unsure      Patient Active Problem List   Diagnosis Date Noted   Hyperlipidemia 04/07/2021   Need for influenza vaccination 01/04/2021   Hyperlipidemia associated with type 2 diabetes mellitus ([place]) 01/04/2021   Morbid obesity ([place]) 01/04/2021   Personal history of colonic polyps    Cecal polyp    Polyp of descending colon    Polyp of sigmoid colon    Adenomatous polyp of transverse colon    Diabetes mellitus without complication ([place]) 73/53/2992   Elevated serum GGT level 05/23/2020   Vitamin D deficiency 05/23/2020   Body mass index (BMI) of 50-59.9 in adult ([place]) 04/15/2020   Single episode  of elevated blood pressure 04/15/2020    Past Surgical History:  Procedure Laterality Date   APPENDECTOMY     COLONOSCOPY WITH PROPOFOL  04/26/2017   COLONOSCOPY WITH PROPOFOL N/A 05/27/2020   Procedure: COLONOSCOPY WITH PROPOFOL;  Surgeon: Virgel Manifold, MD;  Location: [facility] ENDOSCOPY;  Service: Endoscopy;  Laterality: N/A;   HYDROCELE EXCISION Left 04/06/2021   Procedure: HYDROCELECTOMY ADULT;  Surgeon: Royston Cowper, MD;  Location: [facility] ORS;  Service: Urology;  Laterality: Left;    Family History  Problem Relation Age of Onset   Diabetes Father    Glaucoma Father    Cancer Paternal Grandfather     Social History   Tobacco Use   Smoking status: Never   Smokeless tobacco: Former    Types: Chew   Tobacco comments:    High school  Substance Use Topics   Alcohol use: Yes    Comment: rarely     Current Outpatient Medications:    aspirin EC 81 MG tablet, Take 81 mg by mouth daily. Swallow whole., Disp: , Rfl:    atorvastatin (LIPITOR) 80 MG tablet, Take 1 tablet (80 mg total) by mouth daily., Disp: 90 tablet, Rfl: 3   blood glucose meter kit and supplies, Dispense based on patient and insurance preference. Check glucose in the morning fasting and before meals. Use up to four times daily as directed. (FOR ICD-10 E10.9, E11.9)., Disp: 1 each, Rfl:  0   docusate sodium (COLACE) 100 MG capsule, Take 100 mg by mouth daily., Disp: , Rfl:    empagliflozin (JARDIANCE) 10 MG TABS tablet, Take 1 tablet (10 mg total) by mouth daily., Disp: 30 tablet, Rfl: 5   famotidine (PEPCID) 20 MG tablet, Take 20 mg by mouth daily., Disp: , Rfl:    loratadine (CLARITIN) 10 MG tablet, Take 10 mg by mouth daily., Disp: , Rfl:    metFORMIN (GLUCOPHAGE) 500 MG tablet, Take 1 tablet (500 mg total) by mouth 2 (two) times daily with a meal., Disp: 60 tablet, Rfl: 3   Multiple Vitamin (MULTIVITAMIN WITH MINERALS) TABS tablet, Take 1 tablet by mouth daily., Disp: , Rfl:    Semaglutide, 1 MG/DOSE,  (OZEMPIC, 1 MG/DOSE,) 4 MG/3ML SOPN, Inject 1 mg into the skin once a week., Disp: , Rfl:   No Known Allergies  I personally reviewed active problem list, medication list, allergies, health maintenance, notes from last encounter, lab results with the patient/caregiver today.   Review of Systems  Constitutional:  Negative for chills and fever.  Eyes:  Negative for blurred vision and double vision.  Respiratory:  Negative for shortness of breath and wheezing.   Cardiovascular:  Negative for chest pain, claudication and leg swelling.  Gastrointestinal:  Negative for diarrhea, nausea and vomiting.  Musculoskeletal:  Negative for joint pain and myalgias.  Neurological:  Negative for dizziness and headaches.  Endo/Heme/Allergies:  Negative for polydipsia.      Objective  Vitals:   11/28/21 0942  BP: (!) 135/90  Pulse: (!) 109  Temp: 99 F (37.2 C)  TempSrc: Oral  SpO2: 97%  Weight: (!) 318 lb 3.2 oz (144.3 kg)  Height: 5' 5.98" (1.676 m)    Body mass index is 51.38 kg/m.  Physical Exam Vitals reviewed.  Constitutional:      General: He is awake.     Appearance: Normal appearance. He is well-developed and well-groomed.  HENT:     Head: Normocephalic and atraumatic.  Cardiovascular:     Rate and Rhythm: Normal rate and regular rhythm.     Pulses: Normal pulses.          Radial pulses are 2+ on the right side and 2+ on the left side.     Heart sounds: Normal heart sounds. No murmur heard.    No friction rub. No gallop.  Pulmonary:     Effort: Pulmonary effort is normal.     Breath sounds: Normal breath sounds. No decreased air movement. No decreased breath sounds, wheezing, rhonchi or rales.  Neurological:     Mental Status: He is alert.  Psychiatric:        Attention and Perception: Attention and perception normal.        Mood and Affect: Mood and affect normal.        Speech: Speech normal.        Behavior: Behavior normal. Behavior is cooperative.      No  results found for this or any previous visit (from the past 2160 hour(s)).   PHQ2/9:    11/28/2021    9:51 AM 08/28/2021    8:36 AM 05/26/2021    8:45 AM 04/07/2021    8:24 AM 02/07/2021    3:47 PM  Depression screen PHQ 2/9  Decreased Interest 0 0 0 0 0  Down, Depressed, Hopeless 0 0 0 0 0  PHQ - 2 Score 0 0 0 0 0  Altered sleeping 0 0 1 0 1  Tired, decreased energy 0 0 0 0 0  Change in appetite 0 0 0 0 0  Feeling bad or failure about yourself  0 0 0 0 0  Trouble concentrating 0 0 0 0 0  Moving slowly or fidgety/restless 0 0 0 0 0  Suicidal thoughts 0 0 0 0 0  PHQ-9 Score 0 0 1 0 1  Difficult doing work/chores Not difficult at all Not difficult at all Not difficult at all Not difficult at all Not difficult at all      Fall Risk:    11/28/2021    9:51 AM 08/28/2021    8:36 AM 05/26/2021    8:45 AM 04/07/2021    8:24 AM 02/07/2021    4:09 PM  Fall Risk   Falls in the past year? 0 0 0 0 0  Number falls in past yr: 0 0 0 0 0  Injury with Fall? 0 0 0 0 0  Risk for fall due to : No Fall Risks No Fall Risks No Fall Risks No Fall Risks   Follow up Falls evaluation completed Falls evaluation completed Falls evaluation completed Falls evaluation completed       Functional Status Survey:      Assessment & Plan  Problem List Items Addressed This Visit       Endocrine   Diabetes mellitus without complication ([place]) - Primary    Chronic, historic condition Appears to be tolerating current regimen well: Jardiance 10 mg PO QD, Metformin 500 mg PO BID, Ozempic 1 mg injection once per weel Continue current regimen Encouraged diet and exercise measures to provide further control Recheck A1c today- results to dictate further management Follow up in 3 months for monitoring       Relevant Medications   Semaglutide, 1 MG/DOSE, (OZEMPIC, 1 MG/DOSE,) 4 MG/3ML SOPN   Other Relevant Orders   Bayer DCA Hb A1c Waived (STAT)   Hyperlipidemia associated with type 2 diabetes mellitus (HCC)     Chronic, historic condition He is taking Atorvastatin 80 mg PO QD and appears to be tolerating well Reviewed previous lipid panel - trig is still mildly elevated but unsure if this was a fasting lab  Recommend he continue with Atorvastatin and will recheck cholesterol in 3 months for monitoring  Follow up in 3 months      Relevant Medications   Semaglutide, 1 MG/DOSE, (OZEMPIC, 1 MG/DOSE,) 4 MG/3ML SOPN     Other   Single episode of elevated blood pressure    Acute, new concern after taking vitals at visit today Recommend he check BP several times per week and record If he notices BP is getting >135/>85 consistently he should follow up with [place] sooner than regularly scheduled follow up  Will revisit based on home BP readings         Return in about 3 months (around 02/28/2022) for Diabetes follow up, HTN.   I, Jaquin Coy E Malay Fantroy, PA-C, have reviewed all documentation for this visit. The documentation on 11/28/21 for the exam, diagnosis, procedures, and orders are all accurate and complete.   Talitha Givens, MHS, PA-C [facility]

## 2021-11-28 ENCOUNTER — Encounter: Payer: Self-pay | Admitting: Physician Assistant

## 2021-11-28 ENCOUNTER — Ambulatory Visit (INDEPENDENT_AMBULATORY_CARE_PROVIDER_SITE_OTHER): Payer: 59 | Admitting: Physician Assistant

## 2021-11-28 VITALS — BP 135/90 | HR 109 | Temp 99.0°F | Ht 65.98 in | Wt 318.2 lb

## 2021-11-28 DIAGNOSIS — E1169 Type 2 diabetes mellitus with other specified complication: Secondary | ICD-10-CM | POA: Diagnosis not present

## 2021-11-28 DIAGNOSIS — E785 Hyperlipidemia, unspecified: Secondary | ICD-10-CM

## 2021-11-28 DIAGNOSIS — R030 Elevated blood-pressure reading, without diagnosis of hypertension: Secondary | ICD-10-CM | POA: Diagnosis not present

## 2021-11-28 DIAGNOSIS — E119 Type 2 diabetes mellitus without complications: Secondary | ICD-10-CM | POA: Diagnosis not present

## 2021-11-28 LAB — BAYER DCA HB A1C WAIVED: HB A1C (BAYER DCA - WAIVED): 5.6 % (ref 4.8–5.6)

## 2021-11-28 NOTE — Assessment & Plan Note (Signed)
Chronic, historic condition Appears to be tolerating current regimen well: Jardiance 10 mg PO QD, Metformin 500 mg PO BID, Ozempic 1 mg injection once per weel Continue current regimen Encouraged diet and exercise measures to provide further control Recheck A1c today- results to dictate further management Follow up in 3 months for monitoring

## 2021-11-28 NOTE — Assessment & Plan Note (Signed)
Chronic, historic condition He is taking Atorvastatin 80 mg PO QD and appears to be tolerating well Reviewed previous lipid panel - trig is still mildly elevated but unsure if this was a fasting lab  Recommend he continue with Atorvastatin and will recheck cholesterol in 3 months for monitoring  Follow up in 3 months

## 2021-11-28 NOTE — Assessment & Plan Note (Signed)
Acute, new concern after taking vitals at visit today Recommend he check BP several times per week and record If he notices BP is getting >135/>85 consistently he should follow up with [place] sooner than regularly scheduled follow up  Will revisit based on home BP readings

## 2021-11-28 NOTE — Patient Instructions (Signed)
Your blood pressure was mildly elevated today.  If possible please take it at home using an electronic blood pressure cuff for the upper arm Record your blood pressure once per day and bring them back with you to your apt so we can make sure you are not developing high blood pressure.   Incorporating a minimum of 150 minutes (20-30 minutes per day) of moderate intensity physical activity can help improve your heart health and reduce the chances of high blood pressure and other cardiovascular risks. Incorporating a heart healthy diet can also help reduce the chances of heart attack and high cholesterol.  If you notice you are getting multiple readings over 135 on the top and over 85 on the bottom please come back before your next regular follow up   I appreciate the opportunity to be involved in your care If you were satisfied with the care you received from me, I would greatly appreciate you saying so in the after-visit survey that is sent out following our visit.

## 2021-12-15 ENCOUNTER — Other Ambulatory Visit: Payer: Self-pay

## 2021-12-15 MED ORDER — EMPAGLIFLOZIN 10 MG PO TABS: 10.0000 mg | ORAL_TABLET | Freq: Every day | ORAL | 5 refills | Status: DC

## 2021-12-15 NOTE — Telephone Encounter (Signed)
Medication refill for Jardiance '10mg'$   last ov 11/28/21, upcoming ov 02/28/22 . Please advise

## 2021-12-19 ENCOUNTER — Other Ambulatory Visit: Payer: Self-pay | Admitting: Internal Medicine

## 2021-12-19 MED ORDER — EMPAGLIFLOZIN 10 MG PO TABS: 10.0000 mg | ORAL_TABLET | Freq: Every day | ORAL | 5 refills | Status: DC

## 2021-12-19 NOTE — Telephone Encounter (Signed)
Change of pharmacy  Requested Prescriptions  Pending Prescriptions Disp Refills   empagliflozin (JARDIANCE) 10 MG TABS tablet 30 tablet 5    Sig: Take 1 tablet (10 mg total) by mouth daily.     Endocrinology:  Diabetes - SGLT2 Inhibitors Passed - 12/19/2021 10:17 AM      Passed - Cr in normal range and within 360 days    Creatinine, Ser  Date Value Ref Range Status  08/28/2021 1.00 0.76 - 1.27 mg/dL Final         Passed - HBA1C is between 0 and 7.9 and within 180 days    HB A1C (BAYER DCA - WAIVED)  Date Value Ref Range Status  11/28/2021 5.6 4.8 - 5.6 % Final    Comment:             Prediabetes: 5.7 - 6.4          Diabetes: >6.4          Glycemic control for adults with diabetes: <7.0          Passed - eGFR in normal range and within 360 days    GFR calc Af Amer  Date Value Ref Range Status  05/08/2017 >60 >60 mL/min Final    Comment:    (NOTE) The eGFR has been calculated using the CKD EPI equation. This calculation has not been validated in all clinical situations. eGFR's persistently <60 mL/min signify possible Chronic Kidney Disease.    GFR, Estimated  Date Value Ref Range Status  07/15/2020 >60 >60 mL/min Final    Comment:    (NOTE) Calculated using the CKD-EPI Creatinine Equation (2021)    eGFR  Date Value Ref Range Status  08/28/2021 89 >59 mL/min/1.73 Final         Passed - Valid encounter within last 6 months    Recent Outpatient Visits           3 weeks ago Diabetes mellitus without complication ([place])   [facility] Mecum, Erin E, PA-C   1 month ago Ingrown nail of great toe of left foot   [employer], Megan P, DO   3 months ago Diabetes mellitus without complication ([place])   [facility] Mecum, Erin E, PA-C   6 months ago Body mass index (BMI) of 50-59.9 in adult [facility])   [facility] Vigg, Avanti, MD   8 months ago Hyperlipidemia, unspecified hyperlipidemia type   [place], MD       Future Appointments             In 2 months Mecum, Dani Gobble, PA-C [employer]

## 2021-12-19 NOTE — Telephone Encounter (Signed)
Medication Refill - Medication: empagliflozin (JARDIANCE) 10 MG TABS tablet   Has the patient contacted their pharmacy? No, [facility] but wants it at [facility], [place] doesn't except his insurance. (Agent: If no, request that the patient contact the pharmacy for the refill. If patient does not wish to contact the pharmacy document the reason why and proceed with request.) (Agent: If yes, when and what did the pharmacy advise?)  Preferred Pharmacy (with phone number or street name):  [facility] [place], [address] Phone: [phone]  Fax: [phone]     Has the patient been seen for an appointment in the last year OR does the patient have an upcoming appointment? yes  Agent: Please be advised that RX refills may take up to 3 business days. We ask that you follow-up with your pharmacy.

## 2022-01-01 LAB — HM DIABETES EYE EXAM

## 2022-01-02 ENCOUNTER — Telehealth: Payer: Self-pay

## 2022-01-02 NOTE — Telephone Encounter (Signed)
4 boxes of Ozempic received for the patient. Called and notified him that it was ready to be picked up.  

## 2022-02-25 DIAGNOSIS — K219 Gastro-esophageal reflux disease without esophagitis: Secondary | ICD-10-CM | POA: Insufficient documentation

## 2022-02-25 NOTE — Patient Instructions (Incomplete)
Diabetes Mellitus Basics  Diabetes mellitus, or diabetes, is a long-term (chronic) disease. It occurs when the body does not properly use sugar (glucose) that is released from food after you eat. Diabetes mellitus may be caused by one or both of these problems: Your pancreas does not make enough of a hormone called insulin. Your body does not react in a normal way to the insulin that it makes. Insulin lets glucose enter cells in your body. This gives you energy. If you have diabetes, glucose cannot get into cells. This causes high blood glucose (hyperglycemia). How to treat and manage diabetes You may need to take insulin or other diabetes medicines daily to keep your glucose in balance. If you are prescribed insulin, you will learn how to give yourself insulin by injection. You may need to adjust the amount of insulin you take based on the foods that you eat. You will need to check your blood glucose levels using a glucose monitor as told by your health care provider. The readings can help determine if you have low or high blood glucose. Generally, you should have these blood glucose levels: Before meals (preprandial): 80-130 mg/dL (4.4-7.2 mmol/L). After meals (postprandial): below 180 mg/dL (10 mmol/L). Hemoglobin A1c (HbA1c) level: less than 7%. Your health care provider will set treatment goals for you. Keep all follow-up visits. This is important. Follow these instructions at home: Diabetes medicines Take your diabetes medicines every day as told by your health care provider. List your diabetes medicines here: Name of medicine: ______________________________ Amount (dose): _______________ Time (a.m./p.m.): _______________ Notes: ___________________________________ Name of medicine: ______________________________ Amount (dose): _______________ Time (a.m./p.m.): _______________ Notes: ___________________________________ Name of medicine: ______________________________ Amount (dose):  _______________ Time (a.m./p.m.): _______________ Notes: ___________________________________ Insulin If you use insulin, list the types of insulin you use here: Insulin type: ______________________________ Amount (dose): _______________ Time (a.m./p.m.): _______________Notes: ___________________________________ Insulin type: ______________________________ Amount (dose): _______________ Time (a.m./p.m.): _______________ Notes: ___________________________________ Insulin type: ______________________________ Amount (dose): _______________ Time (a.m./p.m.): _______________ Notes: ___________________________________ Insulin type: ______________________________ Amount (dose): _______________ Time (a.m./p.m.): _______________ Notes: ___________________________________ Insulin type: ______________________________ Amount (dose): _______________ Time (a.m./p.m.): _______________ Notes: ___________________________________ Managing blood glucose  Check your blood glucose levels using a glucose monitor as told by your health care provider. Write down the times that you check your glucose levels here: Time: _______________ Notes: ___________________________________ Time: _______________ Notes: ___________________________________ Time: _______________ Notes: ___________________________________ Time: _______________ Notes: ___________________________________ Time: _______________ Notes: ___________________________________ Time: _______________ Notes: ___________________________________  Low blood glucose Low blood glucose (hypoglycemia) is when glucose is at or below 70 mg/dL (3.9 mmol/L). Symptoms may include: Feeling: Hungry. Sweaty and clammy. Irritable or easily upset. Dizzy. Sleepy. Having: A fast heartbeat. A headache. A change in your vision. Numbness around the mouth, lips, or tongue. Having trouble with: Moving (coordination). Sleeping. Treating low blood glucose To treat low blood  glucose, eat or drink something containing sugar right away. If you can think clearly and swallow safely, follow the 15:15 rule: Take 15 grams of a fast-acting carb (carbohydrate), as told by your health care provider. Some fast-acting carbs are: Glucose tablets: take 3-4 tablets. Hard candy: eat 3-5 pieces. Fruit juice: drink 4 oz (120 mL). Regular (not diet) soda: drink 4-6 oz (120-180 mL). Honey or sugar: eat 1 Tbsp (15 mL). Check your blood glucose levels 15 minutes after you take the carb. If your glucose is still at or below 70 mg/dL (3.9 mmol/L), take 15 grams of a carb again. If your glucose does not go above 70 mg/dL (3.9 mmol/L) after   3 tries, get help right away. After your glucose goes back to normal, eat a meal or a snack within 1 hour. Treating very low blood glucose If your glucose is at or below 54 mg/dL (3 mmol/L), you have very low blood glucose (severe hypoglycemia). This is an emergency. Do not wait to see if the symptoms will go away. Get medical help right away. Call your local emergency services (911 in the [place] Do not drive yourself to the hospital. Questions to ask your health care provider Should I talk with a diabetes educator? What equipment will I need to care for myself at home? What diabetes medicines do I need? When should I take them? How often do I need to check my blood glucose levels? What number can I call if I have questions? When is my follow-up visit? Where can I find a support group for people with diabetes? Where to find more information American Diabetes Association: www.diabetes.org Association of Diabetes Care and Education Specialists: www.diabeteseducator.org Contact a health care provider if: Your blood glucose is at or above 240 mg/dL (13.3 mmol/L) for 2 days in a row. You have been sick or have had a fever for 2 days or more, and you are not getting better. You have any of these problems for more than 6 hours: You cannot eat or  drink. You feel nauseous. You vomit. You have diarrhea. Get help right away if: Your blood glucose is lower than 54 mg/dL (3 mmol/L). You get confused. You have trouble thinking clearly. You have trouble breathing. These symptoms may represent a serious problem that is an emergency. Do not wait to see if the symptoms will go away. Get medical help right away. Call your local emergency services (911 in the [place] Do not drive yourself to the hospital. Summary Diabetes mellitus is a chronic disease that occurs when the body does not properly use sugar (glucose) that is released from food after you eat. Take insulin and diabetes medicines as told. Check your blood glucose every day, as often as told. Keep all follow-up visits. This is important. This information is not intended to replace advice given to you by your health care provider. Make sure you discuss any questions you have with your health care provider. Document Revised: 05/26/2019 Document Reviewed: 05/26/2019 Elsevier Patient Education  2023 [employer]  

## 2022-02-28 ENCOUNTER — Ambulatory Visit: Payer: 59 | Admitting: Nurse Practitioner

## 2022-03-12 ENCOUNTER — Ambulatory Visit: Payer: 59 | Admitting: Nurse Practitioner

## 2022-03-18 NOTE — Patient Instructions (Signed)
Diabetes Mellitus Basics  Diabetes mellitus, or diabetes, is a long-term (chronic) disease. It occurs when the body does not properly use sugar (glucose) that is released from food after you eat. Diabetes mellitus may be caused by one or both of these problems: Your pancreas does not make enough of a hormone called insulin. Your body does not react in a normal way to the insulin that it makes. Insulin lets glucose enter cells in your body. This gives you energy. If you have diabetes, glucose cannot get into cells. This causes high blood glucose (hyperglycemia). How to treat and manage diabetes You may need to take insulin or other diabetes medicines daily to keep your glucose in balance. If you are prescribed insulin, you will learn how to give yourself insulin by injection. You may need to adjust the amount of insulin you take based on the foods that you eat. You will need to check your blood glucose levels using a glucose monitor as told by your health care provider. The readings can help determine if you have low or high blood glucose. Generally, you should have these blood glucose levels: Before meals (preprandial): 80-130 mg/dL (4.4-7.2 mmol/L). After meals (postprandial): below 180 mg/dL (10 mmol/L). Hemoglobin A1c (HbA1c) level: less than 7%. Your health care provider will set treatment goals for you. Keep all follow-up visits. This is important. Follow these instructions at home: Diabetes medicines Take your diabetes medicines every day as told by your health care provider. List your diabetes medicines here: Name of medicine: ______________________________ Amount (dose): _______________ Time (a.m./p.m.): _______________ Notes: ___________________________________ Name of medicine: ______________________________ Amount (dose): _______________ Time (a.m./p.m.): _______________ Notes: ___________________________________ Name of medicine: ______________________________ Amount (dose):  _______________ Time (a.m./p.m.): _______________ Notes: ___________________________________ Insulin If you use insulin, list the types of insulin you use here: Insulin type: ______________________________ Amount (dose): _______________ Time (a.m./p.m.): _______________Notes: ___________________________________ Insulin type: ______________________________ Amount (dose): _______________ Time (a.m./p.m.): _______________ Notes: ___________________________________ Insulin type: ______________________________ Amount (dose): _______________ Time (a.m./p.m.): _______________ Notes: ___________________________________ Insulin type: ______________________________ Amount (dose): _______________ Time (a.m./p.m.): _______________ Notes: ___________________________________ Insulin type: ______________________________ Amount (dose): _______________ Time (a.m./p.m.): _______________ Notes: ___________________________________ Managing blood glucose  Check your blood glucose levels using a glucose monitor as told by your health care provider. Write down the times that you check your glucose levels here: Time: _______________ Notes: ___________________________________ Time: _______________ Notes: ___________________________________ Time: _______________ Notes: ___________________________________ Time: _______________ Notes: ___________________________________ Time: _______________ Notes: ___________________________________ Time: _______________ Notes: ___________________________________  Low blood glucose Low blood glucose (hypoglycemia) is when glucose is at or below 70 mg/dL (3.9 mmol/L). Symptoms may include: Feeling: Hungry. Sweaty and clammy. Irritable or easily upset. Dizzy. Sleepy. Having: A fast heartbeat. A headache. A change in your vision. Numbness around the mouth, lips, or tongue. Having trouble with: Moving (coordination). Sleeping. Treating low blood glucose To treat low blood  glucose, eat or drink something containing sugar right away. If you can think clearly and swallow safely, follow the 15:15 rule: Take 15 grams of a fast-acting carb (carbohydrate), as told by your health care provider. Some fast-acting carbs are: Glucose tablets: take 3-4 tablets. Hard candy: eat 3-5 pieces. Fruit juice: drink 4 oz (120 mL). Regular (not diet) soda: drink 4-6 oz (120-180 mL). Honey or sugar: eat 1 Tbsp (15 mL). Check your blood glucose levels 15 minutes after you take the carb. If your glucose is still at or below 70 mg/dL (3.9 mmol/L), take 15 grams of a carb again. If your glucose does not go above 70 mg/dL (3.9 mmol/L) after   3 tries, get help right away. After your glucose goes back to normal, eat a meal or a snack within 1 hour. Treating very low blood glucose If your glucose is at or below 54 mg/dL (3 mmol/L), you have very low blood glucose (severe hypoglycemia). This is an emergency. Do not wait to see if the symptoms will go away. Get medical help right away. Call your local emergency services (911 in the [place] Do not drive yourself to the hospital. Questions to ask your health care provider Should I talk with a diabetes educator? What equipment will I need to care for myself at home? What diabetes medicines do I need? When should I take them? How often do I need to check my blood glucose levels? What number can I call if I have questions? When is my follow-up visit? Where can I find a support group for people with diabetes? Where to find more information American Diabetes Association: www.diabetes.org Association of Diabetes Care and Education Specialists: www.diabeteseducator.org Contact a health care provider if: Your blood glucose is at or above 240 mg/dL (13.3 mmol/L) for 2 days in a row. You have been sick or have had a fever for 2 days or more, and you are not getting better. You have any of these problems for more than 6 hours: You cannot eat or  drink. You feel nauseous. You vomit. You have diarrhea. Get help right away if: Your blood glucose is lower than 54 mg/dL (3 mmol/L). You get confused. You have trouble thinking clearly. You have trouble breathing. These symptoms may represent a serious problem that is an emergency. Do not wait to see if the symptoms will go away. Get medical help right away. Call your local emergency services (911 in the [place] Do not drive yourself to the hospital. Summary Diabetes mellitus is a chronic disease that occurs when the body does not properly use sugar (glucose) that is released from food after you eat. Take insulin and diabetes medicines as told. Check your blood glucose every day, as often as told. Keep all follow-up visits. This is important. This information is not intended to replace advice given to you by your health care provider. Make sure you discuss any questions you have with your health care provider. Document Revised: 05/26/2019 Document Reviewed: 05/26/2019 Elsevier Patient Education  2023 Elsevier Inc.  

## 2022-03-21 ENCOUNTER — Encounter: Payer: Self-pay | Admitting: Nurse Practitioner

## 2022-03-21 ENCOUNTER — Ambulatory Visit (INDEPENDENT_AMBULATORY_CARE_PROVIDER_SITE_OTHER): Payer: 59 | Admitting: Nurse Practitioner

## 2022-03-21 DIAGNOSIS — Z23 Encounter for immunization: Secondary | ICD-10-CM

## 2022-03-21 DIAGNOSIS — E559 Vitamin D deficiency, unspecified: Secondary | ICD-10-CM

## 2022-03-21 DIAGNOSIS — R748 Abnormal levels of other serum enzymes: Secondary | ICD-10-CM

## 2022-03-21 DIAGNOSIS — N400 Enlarged prostate without lower urinary tract symptoms: Secondary | ICD-10-CM

## 2022-03-21 DIAGNOSIS — K219 Gastro-esophageal reflux disease without esophagitis: Secondary | ICD-10-CM

## 2022-03-21 DIAGNOSIS — E1169 Type 2 diabetes mellitus with other specified complication: Secondary | ICD-10-CM | POA: Diagnosis not present

## 2022-03-21 DIAGNOSIS — Z6841 Body Mass Index (BMI) 40.0 and over, adult: Secondary | ICD-10-CM | POA: Diagnosis not present

## 2022-03-21 DIAGNOSIS — E785 Hyperlipidemia, unspecified: Secondary | ICD-10-CM

## 2022-03-21 NOTE — Assessment & Plan Note (Signed)
Recheck today and consider imaging if needed.

## 2022-03-21 NOTE — Assessment & Plan Note (Signed)
Chronic, ongoing. Continue current medication regimen and adjust as needed.  Lipid panel today. 

## 2022-03-21 NOTE — Assessment & Plan Note (Addendum)
Chronic, stable with last A1c 5.6% in October.  Will recheck today along with urine ALB.  Discussed with patient if continues to have good A1c control could consider discontinuation of Metformin and then if elevations in sugar present without Metformin, then increase Jardiance or Ozempic.  He agrees with this plan.  Continue to check BS every other day and document for visits.  Focus on diabetic diet.  Return in 6 months. - Eye exam up to date - Foot exam up to date - PPSV23 needed, discussed with patient - No ACE or ARB -- will consider in future, but currently BP on lower side. - Statin on board.

## 2022-03-21 NOTE — Progress Notes (Signed)
BP 105/65 Comment: Patient BP cuff  Pulse 66   Temp 98.2 F (36.8 C) (Oral)   Ht 5' 5.98" (1.676 m)   Wt (!) 320 lb 8 oz (145.4 kg)   SpO2 99%   BMI 51.76 kg/m    Subjective:    Patient ID: Casey Velez, male    DOB: [birth year], 57 y.o.   MRN: [id number]  HPI: Casey Velez is a 57 y.o. male  Chief Complaint  Patient presents with   Diabetes   Hypertension   DIABETES Last A1c 5.6% in October -- was initially diagnosed over a year ago.  Takes Metformin 500 MG BID, Ozempic 1 MG, and Jardiance 10 MG daily.  History of Vitamin D deficiency, no current supplement. Hypoglycemic episodes:no Polydipsia/polyuria: no Visual disturbance: no Chest pain: no Paresthesias: no Glucose Monitoring: yes  Accucheck frequency:  every other day  Fasting glucose: 97 to 112  Post prandial:  Evening:  Before meals: Taking Insulin?: no  Long acting insulin:  Short acting insulin: Blood Pressure Monitoring:  every other day Retinal Examination: Up to Date Foot Exam: Up to Date Pneumovax: Not up to Date - educated him on this today Influenza: Up to Date Aspirin: yes   HYPERTENSION / HYPERLIPIDEMIA Currently no BP medications and continues on Atorvastatin for HLD.  Continues on Pepcid daily for heart burn. Satisfied with current treatment? yes Duration of hypertension: chronic BP monitoring frequency:  every other day BP range: 100-110/60-70 BP medication side effects: no Duration of hyperlipidemia: chronic Cholesterol medication side effects: yes Cholesterol supplements: none Medication compliance: good compliance Aspirin: yes Recent stressors: no Recurrent headaches: no Visual changes: no Palpitations: no Dyspnea: no Chest pain: no Lower extremity edema: no Dizzy/lightheaded: no   Relevant past medical, surgical, family and social history reviewed and updated as indicated. Interim medical history since our last visit reviewed. Allergies and medications reviewed and  updated.  Review of Systems  Constitutional:  Negative for activity change, diaphoresis, fatigue and fever.  Respiratory:  Negative for cough, chest tightness, shortness of breath and wheezing.   Cardiovascular:  Negative for chest pain, palpitations and leg swelling.  Gastrointestinal: Negative.   Endocrine: Negative for cold intolerance, heat intolerance, polydipsia, polyphagia and polyuria.  Neurological: Negative.   Psychiatric/Behavioral: Negative.     Per HPI unless specifically indicated above     Objective:    BP 105/65 Comment: Patient BP cuff  Pulse 66   Temp 98.2 F (36.8 C) (Oral)   Ht 5' 5.98" (1.676 m)   Wt (!) 320 lb 8 oz (145.4 kg)   SpO2 99%   BMI 51.76 kg/m   Wt Readings from Last 3 Encounters:  03/21/22 (!) 320 lb 8 oz (145.4 kg)  11/28/21 (!) 318 lb 3.2 oz (144.3 kg)  11/16/21 (!) 319 lb 3.2 oz (144.8 kg)    Physical Exam Vitals and nursing note reviewed.  Constitutional:      General: He is awake. He is not in acute distress.    Appearance: He is well-developed and well-groomed. He is obese. He is not ill-appearing or toxic-appearing.  HENT:     Head: Normocephalic.  Eyes:     General: Lids are normal.     Extraocular Movements: Extraocular movements intact.     Conjunctiva/sclera: Conjunctivae normal.  Neck:     Thyroid: No thyromegaly.     Vascular: No carotid bruit.  Cardiovascular:     Rate and Rhythm: Normal rate and regular rhythm.  Heart sounds: Normal heart sounds.  Pulmonary:     Effort: No accessory muscle usage or respiratory distress.     Breath sounds: Normal breath sounds.  Abdominal:     General: Bowel sounds are normal. There is no distension.     Palpations: Abdomen is soft.     Tenderness: There is no abdominal tenderness.  Musculoskeletal:     Cervical back: Full passive range of motion without pain.     Right lower leg: No edema.     Left lower leg: No edema.  Lymphadenopathy:     Cervical: No cervical adenopathy.   Skin:    General: Skin is warm.     Capillary Refill: Capillary refill takes less than 2 seconds.  Neurological:     Mental Status: He is alert and oriented to person, place, and time.     Deep Tendon Reflexes: Reflexes are normal and symmetric.     Reflex Scores:      Brachioradialis reflexes are 2+ on the right side and 2+ on the left side.      Patellar reflexes are 2+ on the right side and 2+ on the left side. Psychiatric:        Attention and Perception: Attention normal.        Mood and Affect: Mood normal.        Speech: Speech normal.        Behavior: Behavior normal. Behavior is cooperative.        Thought Content: Thought content normal.    Results for orders placed or performed in visit on 01/09/22  HM DIABETES EYE EXAM  Result Value Ref Range   HM Diabetic Eye Exam No Retinopathy No Retinopathy      Assessment & Plan:   Problem List Items Addressed This Visit       Digestive   GERD without esophagitis    Chronic, ongoing.  Continues on Pepcid with benefit.  Mag level.        Relevant Orders   Magnesium     Endocrine   Hyperlipidemia associated with type 2 diabetes mellitus (HCC)    Chronic, ongoing.  Continue current medication regimen and adjust as needed.  Lipid panel today.      Relevant Orders   HgB A1c   Urine Microalbumin w/creat. ratio   Comprehensive metabolic panel   Lipid Panel w/o Chol/HDL Ratio   Type 2 diabetes mellitus with morbid obesity ([place]) - Primary    Chronic, stable with last A1c 5.6% in October.  Will recheck today along with urine ALB.  Discussed with patient if continues to have good A1c control could consider discontinuation of Metformin and then if elevations in sugar present without Metformin, then increase Jardiance or Ozempic.  He agrees with this plan.  Continue to check BS every other day and document for visits.  Focus on diabetic diet.  Return in 6 months. - Eye exam up to date - Foot exam up to date - PPSV23 needed,  discussed with patient - No ACE or ARB -- will consider in future, but currently BP on lower side. - Statin on board.      Relevant Orders   HgB A1c   Urine Microalbumin w/creat. ratio   CBC with Differential/Platelet   TSH     Other   Body mass index (BMI) of 50-59.9 in adult [facility])    Refer to morbid obesity plan of care.      Elevated serum GGT level  Recheck today and consider imaging if needed.      Relevant Orders   Comprehensive metabolic panel   Gamma GT   Morbid obesity ([place])    BMI 51.76.  Recommended eating smaller high protein, low fat meals more frequently and exercising 30 mins a day 5 times a week with a goal of 10-15lb weight loss in the next 3 months. Patient voiced their understanding and motivation to adhere to these recommendations.       Vitamin D deficiency    Chronic, ongoing.  Will recheck level today and restart supplement as needed.      Relevant Orders   VITAMIN D 25 Hydroxy (Vit-D Deficiency, Fractures)   Other Visit Diagnoses     Benign prostatic hyperplasia without lower urinary tract symptoms       PSA on labs today.   Relevant Orders   PSA   Need for Td vaccine       Td vaccine in office today.   Relevant Orders   Td vaccine greater than or equal to 7yo preservative free IM (Completed)   Flu vaccine need       Flu vaccine in office today.   Relevant Orders   Flu Vaccine QUAD 49moIM (Fluarix, Fluzone & Alfiuria Quad PF) (Completed)        Follow up plan: Return in about 6 months (around 09/19/2022) for T2DM, HTN/HLD, GERD.

## 2022-03-21 NOTE — Assessment & Plan Note (Signed)
BMI 51.76.  Recommended eating smaller high protein, low fat meals more frequently and exercising 30 mins a day 5 times a week with a goal of 10-15lb weight loss in the next 3 months. Patient voiced their understanding and motivation to adhere to these recommendations.

## 2022-03-21 NOTE — Assessment & Plan Note (Signed)
Chronic, ongoing.  Continues on Pepcid with benefit.  Mag level.

## 2022-03-21 NOTE — Assessment & Plan Note (Signed)
Refer to morbid obesity plan of care.

## 2022-03-21 NOTE — Assessment & Plan Note (Signed)
Chronic, ongoing.  Will recheck level today and restart supplement as needed.

## 2022-03-22 LAB — COMPREHENSIVE METABOLIC PANEL
ALT: 36 IU/L (ref 0–44)
AST: 34 IU/L (ref 0–40)
Albumin/Globulin Ratio: 1.6 (ref 1.2–2.2)
Albumin: 4.2 g/dL (ref 3.8–4.9)
Alkaline Phosphatase: 93 IU/L (ref 44–121)
BUN/Creatinine Ratio: 13 (ref 9–20)
BUN: 12 mg/dL (ref 6–24)
Bilirubin Total: 0.4 mg/dL (ref 0.0–1.2)
CO2: 20 mmol/L (ref 20–29)
Calcium: 9.5 mg/dL (ref 8.7–10.2)
Chloride: 105 mmol/L (ref 96–106)
Creatinine, Ser: 0.95 mg/dL (ref 0.76–1.27)
Globulin, Total: 2.6 g/dL (ref 1.5–4.5)
Glucose: 92 mg/dL (ref 70–99)
Potassium: 4.7 mmol/L (ref 3.5–5.2)
Sodium: 138 mmol/L (ref 134–144)
Total Protein: 6.8 g/dL (ref 6.0–8.5)
eGFR: 95 mL/min/{1.73_m2} (ref >59)

## 2022-03-22 LAB — CBC WITH DIFFERENTIAL/PLATELET
Basophils Absolute: 0.1 10*3/uL (ref 0.0–0.2)
Basos: 1 %
EOS (ABSOLUTE): 0.1 10*3/uL (ref 0.0–0.4)
Eos: 2 %
Hematocrit: 46.4 % (ref 37.5–51.0)
Hemoglobin: 15.7 g/dL (ref 13.0–17.7)
Immature Grans (Abs): 0.1 10*3/uL (ref 0.0–0.1)
Immature Granulocytes: 1 %
Lymphocytes Absolute: 2.3 10*3/uL (ref 0.7–3.1)
Lymphs: 26 %
MCH: 30.2 pg (ref 26.6–33.0)
MCHC: 33.8 g/dL (ref 31.5–35.7)
MCV: 89 fL (ref 79–97)
Monocytes Absolute: 0.7 10*3/uL (ref 0.1–0.9)
Monocytes: 7 %
Neutrophils Absolute: 5.7 10*3/uL (ref 1.4–7.0)
Neutrophils: 63 %
Platelets: 274 10*3/uL (ref 150–450)
RBC: 5.2 x10E6/uL (ref 4.14–5.80)
RDW: 13.2 % (ref 11.6–15.4)
WBC: 9 10*3/uL (ref 3.4–10.8)

## 2022-03-22 LAB — MICROALBUMIN / CREATININE URINE RATIO
Creatinine, Urine: 116.5 mg/dL
Microalb/Creat Ratio: <3 mg/g creat (ref 0–29)
Microalbumin, Urine: <3 ug/mL

## 2022-03-22 LAB — TSH: TSH: 2.41 u[IU]/mL (ref 0.450–4.500)

## 2022-03-22 LAB — LIPID PANEL W/O CHOL/HDL RATIO
Cholesterol, Total: 124 mg/dL (ref 100–199)
HDL: 34 mg/dL — ABNORMAL LOW (ref >39)
LDL Chol Calc (NIH): 63 mg/dL (ref 0–99)
Triglycerides: 153 mg/dL — ABNORMAL HIGH (ref 0–149)
VLDL Cholesterol Cal: 27 mg/dL (ref 5–40)

## 2022-03-22 LAB — HEMOGLOBIN A1C
Est. average glucose Bld gHb Est-mCnc: 126 mg/dL
Hgb A1c MFr Bld: 6 % — ABNORMAL HIGH (ref 4.8–5.6)

## 2022-03-22 LAB — VITAMIN D 25 HYDROXY (VIT D DEFICIENCY, FRACTURES): Vit D, 25-Hydroxy: 18.9 ng/mL — ABNORMAL LOW (ref 30.0–100.0)

## 2022-03-22 LAB — MAGNESIUM: Magnesium: 1.9 mg/dL (ref 1.6–2.3)

## 2022-03-22 LAB — PSA: Prostate Specific Ag, Serum: 0.7 ng/mL (ref 0.0–4.0)

## 2022-03-22 LAB — GAMMA GT: GGT: 68 IU/L — ABNORMAL HIGH (ref 0–65)

## 2022-03-22 NOTE — Progress Notes (Signed)
Contacted via MyChart   Good evening Casey Velez, your labs have returned: - A1c remains stable at 6% -- a little trend up, but only mild.  Continue current medications. - Kidney function, creatinine and eGFR, remains normal, as is liver function, AST and ALT.  - Prostate level, PSA, and thyroid level, TSH, are normal. - CBC shows no anemia or infection. - GGT, which often looks at gall bladder, has trended down from previous.  Good!! - Vitamin D level is low, please ensure you are taking over the counter Vitamin D3 2000 units daily for overall bone health.   - Cholesterol labs show stable LDL, triglycerides remains a little elevated -- please continue Atorvastatin and we will discuss more next visit.  Any questions? Keep being amazing!!  Thank you for allowing me to participate in your care.  I appreciate you. Kindest regards, Tannar Broker

## 2022-05-09 ENCOUNTER — Other Ambulatory Visit: Payer: Self-pay | Admitting: Nurse Practitioner

## 2022-05-09 MED ORDER — OZEMPIC (1 MG/DOSE) 4 MG/3ML ~~LOC~~ SOPN: 1.0000 mg | PEN_INJECTOR | SUBCUTANEOUS | 4 refills | Status: DC

## 2022-05-09 NOTE — Telephone Encounter (Signed)
Requested medication (s) are due for refill today: yes  Requested medication (s) are on the active medication list: yes  Last refill:  11/28/21  Future visit scheduled: yes  Notes to clinic:  Unable to refill per protocol, last refill by another provider. Historical provider.     Requested Prescriptions  Pending Prescriptions Disp Refills   Semaglutide, 1 MG/DOSE, (OZEMPIC, 1 MG/DOSE,) 4 MG/3ML SOPN      Sig: Inject 1 mg into the skin once a week.     Endocrinology:  Diabetes - GLP-1 Receptor Agonists - semaglutide Failed - 05/09/2022 12:51 PM      Failed - HBA1C in normal range and within 180 days    HB A1C (BAYER DCA - WAIVED)  Date Value Ref Range Status  11/28/2021 5.6 4.8 - 5.6 % Final    Comment:             Prediabetes: 5.7 - 6.4          Diabetes: >6.4          Glycemic control for adults with diabetes: <7.0    Hgb A1c MFr Bld  Date Value Ref Range Status  03/21/2022 6.0 (H) 4.8 - 5.6 % Final    Comment:             Prediabetes: 5.7 - 6.4          Diabetes: >6.4          Glycemic control for adults with diabetes: <7.0          Passed - Cr in normal range and within 360 days    Creatinine, Ser  Date Value Ref Range Status  03/21/2022 0.95 0.76 - 1.27 mg/dL Final         Passed - Valid encounter within last 6 months    Recent Outpatient Visits           1 month ago Type 2 diabetes mellitus with morbid obesity ([place])   [place], [place] T, NP   5 months ago Diabetes mellitus without complication ([place])   [facility] Mecum, Erin E, PA-C   5 months ago Ingrown nail of great toe of left foot   [place] [facility] [place], Megan P, DO   8 months ago Diabetes mellitus without complication ([place])   [place], Erin E, PA-C   11 months ago Body mass index (BMI) of 50-59.9 in adult [facility])   [place] Vigg, Avanti, MD       Future  Appointments             In 4 months [place], Barbaraann Faster, NP [place], [facility]

## 2022-05-09 NOTE — Telephone Encounter (Signed)
Medication Refill - Medication: Semaglutide, 1 MG/DOSE, (OZEMPIC, 1 MG/DOSE,) 4 MG/3ML SOPN   Has the patient contacted their pharmacy? No. Pt previously received Rx through pharmacy assistance program  Preferred Pharmacy (with phone number or street name):  [facility], [address] Phone: [phone]  Fax: [phone]     Has the patient been seen for an appointment in the last year OR does the patient have an upcoming appointment? Yes.    Agent: Please be advised that RX refills may take up to 3 business days. We ask that you follow-up with your pharmacy.

## 2022-05-14 ENCOUNTER — Encounter: Payer: Self-pay | Admitting: Nurse Practitioner

## 2022-05-28 ENCOUNTER — Other Ambulatory Visit: Payer: Self-pay

## 2022-05-28 DIAGNOSIS — E785 Hyperlipidemia, unspecified: Secondary | ICD-10-CM

## 2022-05-28 DIAGNOSIS — E1169 Type 2 diabetes mellitus with other specified complication: Secondary | ICD-10-CM

## 2022-05-28 DIAGNOSIS — E119 Type 2 diabetes mellitus without complications: Secondary | ICD-10-CM

## 2022-05-28 MED ORDER — ATORVASTATIN CALCIUM 80 MG PO TABS: 80.0000 mg | ORAL_TABLET | Freq: Every day | ORAL | 3 refills | Status: DC

## 2022-05-28 NOTE — Telephone Encounter (Signed)
Medication refill for Atorvastatin  last ov 03/21/22, upcoming ov 09/19/22 . Please advise

## 2022-06-14 ENCOUNTER — Other Ambulatory Visit: Payer: Self-pay | Admitting: Physician Assistant

## 2022-06-14 NOTE — Telephone Encounter (Signed)
Requested medication (s) are due for refill today: last refill doc. 05/18/22  Requested medication (s) are on the active medication list: yes   Last refill:  12/19/21 #30 5 refills  Future visit scheduled: yes in 3 months   Notes to clinic:   last ordered by E. Mecum, PA 12/19/21.  Do you want to refill Rx?     Requested Prescriptions  Pending Prescriptions Disp Refills   JARDIANCE 10 MG TABS tablet [Pharmacy Med Name: JARDIANCE 10MG  TABLETS] 30 tablet 5    Sig: TAKE 1 TABLET(10 MG) BY MOUTH DAILY     Endocrinology:  Diabetes - SGLT2 Inhibitors Passed - 06/14/2022  3:20 AM      Passed - Cr in normal range and within 360 days    Creatinine, Ser  Date Value Ref Range Status  03/21/2022 0.95 0.76 - 1.27 mg/dL Final         Passed - HBA1C is between 0 and 7.9 and within 180 days    HB A1C (BAYER DCA - WAIVED)  Date Value Ref Range Status  11/28/2021 5.6 4.8 - 5.6 % Final    Comment:             Prediabetes: 5.7 - 6.4          Diabetes: >6.4          Glycemic control for adults with diabetes: <7.0    Hgb A1c MFr Bld  Date Value Ref Range Status  03/21/2022 6.0 (H) 4.8 - 5.6 % Final    Comment:             Prediabetes: 5.7 - 6.4          Diabetes: >6.4          Glycemic control for adults with diabetes: <7.0          Passed - eGFR in normal range and within 360 days    GFR calc Af Amer  Date Value Ref Range Status  05/08/2017 >60 >60 mL/min Final    Comment:    (NOTE) The eGFR has been calculated using the CKD EPI equation. This calculation has not been validated in all clinical situations. eGFR's persistently <60 mL/min signify possible Chronic Kidney Disease.    GFR, Estimated  Date Value Ref Range Status  07/15/2020 >60 >60 mL/min Final    Comment:    (NOTE) Calculated using the CKD-EPI Creatinine Equation (2021)    eGFR  Date Value Ref Range Status  03/21/2022 95 >59 mL/min/1.73 Final         Passed - Valid encounter within last 6 months    Recent  Outpatient Visits           2 months ago Type 2 diabetes mellitus with morbid obesity (HCC)   [place] [facility] [place], [place] T, NP   6 months ago Diabetes mellitus without complication (HCC)   [place] [facility] Mecum, Erin E, PA-C   7 months ago Ingrown nail of great toe of left foot   [place] [facility] [place], Megan P, DO   9 months ago Diabetes mellitus without complication (HCC)   [place] [facility] Mecum, Erin E, PA-C   1 year ago Body mass index (BMI) of 50-59.9 in adult [facility])   [place] [facility] V[place], Avanti, MD       Future Appointments             In 3 months [place],  Dorie Rank, NP [place] [facility], [facility]

## 2022-09-15 NOTE — Patient Instructions (Signed)
Diabetes Mellitus Basics  Diabetes mellitus, or diabetes, is a long-term (chronic) disease. It occurs when the body does not properly use sugar (glucose) that is released from food after you eat. Diabetes mellitus may be caused by one or both of these problems: Your pancreas does not make enough of a hormone called insulin. Your body does not react in a normal way to the insulin that it makes. Insulin lets glucose enter cells in your body. This gives you energy. If you have diabetes, glucose cannot get into cells. This causes high blood glucose (hyperglycemia). How to treat and manage diabetes You may need to take insulin or other diabetes medicines daily to keep your glucose in balance. If you are prescribed insulin, you will learn how to give yourself insulin by injection. You may need to adjust the amount of insulin you take based on the foods that you eat. You will need to check your blood glucose levels using a glucose monitor as told by your health care provider. The readings can help determine if you have low or high blood glucose. Generally, you should have these blood glucose levels: Before meals (preprandial): 80-130 mg/dL (4.4-7.2 mmol/L). After meals (postprandial): below 180 mg/dL (10 mmol/L). Hemoglobin A1c (HbA1c) level: less than 7%. Your health care provider will set treatment goals for you. Keep all follow-up visits. This is important. Follow these instructions at home: Diabetes medicines Take your diabetes medicines every day as told by your health care provider. List your diabetes medicines here: Name of medicine: ______________________________ Amount (dose): _______________ Time (a.m./p.m.): _______________ Notes: ___________________________________ Name of medicine: ______________________________ Amount (dose): _______________ Time (a.m./p.m.): _______________ Notes: ___________________________________ Name of medicine: ______________________________ Amount (dose):  _______________ Time (a.m./p.m.): _______________ Notes: ___________________________________ Insulin If you use insulin, list the types of insulin you use here: Insulin type: ______________________________ Amount (dose): _______________ Time (a.m./p.m.): _______________Notes: ___________________________________ Insulin type: ______________________________ Amount (dose): _______________ Time (a.m./p.m.): _______________ Notes: ___________________________________ Insulin type: ______________________________ Amount (dose): _______________ Time (a.m./p.m.): _______________ Notes: ___________________________________ Insulin type: ______________________________ Amount (dose): _______________ Time (a.m./p.m.): _______________ Notes: ___________________________________ Insulin type: ______________________________ Amount (dose): _______________ Time (a.m./p.m.): _______________ Notes: ___________________________________ Managing blood glucose  Check your blood glucose levels using a glucose monitor as told by your health care provider. Write down the times that you check your glucose levels here: Time: _______________ Notes: ___________________________________ Time: _______________ Notes: ___________________________________ Time: _______________ Notes: ___________________________________ Time: _______________ Notes: ___________________________________ Time: _______________ Notes: ___________________________________ Time: _______________ Notes: ___________________________________  Low blood glucose Low blood glucose (hypoglycemia) is when glucose is at or below 70 mg/dL (3.9 mmol/L). Symptoms may include: Feeling: Hungry. Sweaty and clammy. Irritable or easily upset. Dizzy. Sleepy. Having: A fast heartbeat. A headache. A change in your vision. Numbness around the mouth, lips, or tongue. Having trouble with: Moving (coordination). Sleeping. Treating low blood glucose To treat low blood  glucose, eat or drink something containing sugar right away. If you can think clearly and swallow safely, follow the 15:15 rule: Take 15 grams of a fast-acting carb (carbohydrate), as told by your health care provider. Some fast-acting carbs are: Glucose tablets: take 3-4 tablets. Hard candy: eat 3-5 pieces. Fruit juice: drink 4 oz (120 mL). Regular (not diet) soda: drink 4-6 oz (120-180 mL). Honey or sugar: eat 1 Tbsp (15 mL). Check your blood glucose levels 15 minutes after you take the carb. If your glucose is still at or below 70 mg/dL (3.9 mmol/L), take 15 grams of a carb again. If your glucose does not go above 70 mg/dL (3.9 mmol/L) after   3 tries, get help right away. After your glucose goes back to normal, eat a meal or a snack within 1 hour. Treating very low blood glucose If your glucose is at or below 54 mg/dL (3 mmol/L), you have very low blood glucose (severe hypoglycemia). This is an emergency. Do not wait to see if the symptoms will go away. Get medical help right away. Call your local emergency services (911 in the [place] Do not drive yourself to the hospital. Questions to ask your health care provider Should I talk with a diabetes educator? What equipment will I need to care for myself at home? What diabetes medicines do I need? When should I take them? How often do I need to check my blood glucose levels? What number can I call if I have questions? When is my follow-up visit? Where can I find a support group for people with diabetes? Where to find more information American Diabetes Association: www.diabetes.org Association of Diabetes Care and Education Specialists: www.diabeteseducator.org Contact a health care provider if: Your blood glucose is at or above 240 mg/dL (13.3 mmol/L) for 2 days in a row. You have been sick or have had a fever for 2 days or more, and you are not getting better. You have any of these problems for more than 6 hours: You cannot eat or  drink. You feel nauseous. You vomit. You have diarrhea. Get help right away if: Your blood glucose is lower than 54 mg/dL (3 mmol/L). You get confused. You have trouble thinking clearly. You have trouble breathing. These symptoms may represent a serious problem that is an emergency. Do not wait to see if the symptoms will go away. Get medical help right away. Call your local emergency services (911 in the [place] Do not drive yourself to the hospital. Summary Diabetes mellitus is a chronic disease that occurs when the body does not properly use sugar (glucose) that is released from food after you eat. Take insulin and diabetes medicines as told. Check your blood glucose every day, as often as told. Keep all follow-up visits. This is important. This information is not intended to replace advice given to you by your health care provider. Make sure you discuss any questions you have with your health care provider. Document Revised: 05/26/2019 Document Reviewed: 05/26/2019 Elsevier Patient Education  2024 [employer]  

## 2022-09-19 ENCOUNTER — Ambulatory Visit (INDEPENDENT_AMBULATORY_CARE_PROVIDER_SITE_OTHER): Payer: 59 | Admitting: Nurse Practitioner

## 2022-09-19 ENCOUNTER — Encounter: Payer: Self-pay | Admitting: Nurse Practitioner

## 2022-09-19 DIAGNOSIS — Z6841 Body Mass Index (BMI) 40.0 and over, adult: Secondary | ICD-10-CM

## 2022-09-19 DIAGNOSIS — G4733 Obstructive sleep apnea (adult) (pediatric): Secondary | ICD-10-CM

## 2022-09-19 DIAGNOSIS — Z7984 Long term (current) use of oral hypoglycemic drugs: Secondary | ICD-10-CM

## 2022-09-19 DIAGNOSIS — E1169 Type 2 diabetes mellitus with other specified complication: Secondary | ICD-10-CM | POA: Diagnosis not present

## 2022-09-19 DIAGNOSIS — R0602 Shortness of breath: Secondary | ICD-10-CM

## 2022-09-19 DIAGNOSIS — E785 Hyperlipidemia, unspecified: Secondary | ICD-10-CM

## 2022-09-19 DIAGNOSIS — R0609 Other forms of dyspnea: Secondary | ICD-10-CM | POA: Insufficient documentation

## 2022-09-19 LAB — BAYER DCA HB A1C WAIVED: HB A1C (BAYER DCA - WAIVED): 6.4 % — ABNORMAL HIGH (ref 4.8–5.6)

## 2022-09-19 MED ORDER — ALBUTEROL SULFATE HFA 108 (90 BASE) MCG/ACT IN AERS: 2.0000 | INHALATION_SPRAY | Freq: Four times a day (QID) | RESPIRATORY_TRACT | 4 refills | Status: DC | PRN

## 2022-09-19 NOTE — Assessment & Plan Note (Signed)
BMI 52.39.  Recommended eating smaller high protein, low fat meals more frequently and exercising 30 mins a day 5 times a week with a goal of 10-15lb weight loss in the next 3 months. Patient voiced their understanding and motivation to adhere to these recommendations.

## 2022-09-19 NOTE — Assessment & Plan Note (Signed)
On occasion in hot weather will notice this, suspect some reactive airway.  Non smoker.  Will send in Albuterol to use as needed to see if benefit from this and consider spirometry in future.  Recommend modest weight loss.

## 2022-09-19 NOTE — Assessment & Plan Note (Signed)
Chronic, stable with 6.4% today, mild trend up.  Urine ALB <10 March 2022.  Continue Jardiance only at this time, which is cost effective for him.  He agrees with this plan.  Continue to check BS every other day and document for visits.  Focus on diabetic diet.  Return in 6 months. - Eye exam up to date - Foot exam up to date - PCV20 needed, discussed with patient - No ACE or ARB -- will consider in future, but currently BP on lower side at baseline.  Educated him on this. - Statin on board.

## 2022-09-19 NOTE — Assessment & Plan Note (Signed)
Chronic, ongoing.  Continue current medication regimen and adjust as needed. Lipid panel today. 

## 2022-09-19 NOTE — Progress Notes (Signed)
BP 110/75   Pulse 80   Temp 98.1 F (36.7 C) (Oral)   Ht 5\' 6"  (1.676 m)   Wt (!) 324 lb 9.6 oz (147.2 kg)   SpO2 97%   BMI 52.39 kg/m    Subjective:    Patient ID: Casey Velez, male    DOB: 1965/04/17, 57 y.o.   MRN: [id number]  HPI: Casey Velez is a 57 y.o. male  Chief Complaint  Patient presents with   Diabetes   Gastroesophageal Reflux   Hyperlipidemia   Hypertension   DIABETES Last A1c 6% in February. Diagnosed almost 2 years ago.  Takes Jardiance 10 MG daily -- could not afford Ozempic, tried to get assistance but made too much monety.  Hypoglycemic episodes:no Polydipsia/polyuria: no Visual disturbance: no Chest pain: no Paresthesias: no Glucose Monitoring: yes  Accucheck frequency:  every other day  Fasting glucose: this morning 109, having been averaging 100-110  Post prandial:  Evening:  Before meals: Taking Insulin?: no  Long acting insulin:  Short acting insulin: Blood Pressure Monitoring: occasionally Retinal Examination: Up to Date Foot Exam: Up to Date Pneumovax: Not up to Date - educated him on this today Influenza: Up to Date Aspirin: yes   HYPERTENSION / HYPERLIPIDEMIA Currently no BP medications and continues on Atorvastatin for HLD.  Uses CPAP every night. Satisfied with current treatment? yes Duration of hypertension: chronic BP monitoring frequency: occasionally BP range:  BP medication side effects: no Duration of hyperlipidemia: chronic Cholesterol medication side effects: yes Cholesterol supplements: none Medication compliance: good compliance Aspirin: yes Recent stressors: no Recurrent headaches: no Visual changes: no Palpitations: no Dyspnea: occasionally wakes up and hard to catch breath and walking out in heat will sometimes take a second to catch breath -- occasionally happens, not all the time Chest pain: no Lower extremity edema: no Dizzy/lightheaded: no  The ASCVD Risk score (Arnett DK, et al., 2019) failed  to calculate for the following reasons:   The valid total cholesterol range is 130 to 320 mg/dL  Relevant past medical, surgical, family and social history reviewed and updated as indicated. Interim medical history since our last visit reviewed. Allergies and medications reviewed and updated.  Review of Systems  Constitutional:  Negative for activity change, diaphoresis, fatigue and fever.  Respiratory:  Negative for cough, chest tightness, shortness of breath and wheezing.   Cardiovascular:  Negative for chest pain, palpitations and leg swelling.  Gastrointestinal: Negative.   Endocrine: Negative for cold intolerance, heat intolerance, polydipsia, polyphagia and polyuria.  Neurological: Negative.   Psychiatric/Behavioral: Negative.     Per HPI unless specifically indicated above     Objective:    BP 110/75   Pulse 80   Temp 98.1 F (36.7 C) (Oral)   Ht 5\' 6"  (1.676 m)   Wt (!) 324 lb 9.6 oz (147.2 kg)   SpO2 97%   BMI 52.39 kg/m   Wt Readings from Last 3 Encounters:  09/19/22 (!) 324 lb 9.6 oz (147.2 kg)  03/21/22 (!) 320 lb 8 oz (145.4 kg)  11/28/21 (!) 318 lb 3.2 oz (144.3 kg)    Physical Exam Vitals and nursing note reviewed.  Constitutional:      General: He is awake. He is not in acute distress.    Appearance: He is well-developed and well-groomed. He is obese. He is not ill-appearing or toxic-appearing.  HENT:     Head: Normocephalic.  Eyes:     General: Lids are normal.  Extraocular Movements: Extraocular movements intact.     Conjunctiva/sclera: Conjunctivae normal.  Neck:     Thyroid: No thyromegaly.     Vascular: No carotid bruit.  Cardiovascular:     Rate and Rhythm: Normal rate and regular rhythm.     Heart sounds: Normal heart sounds.  Pulmonary:     Effort: No accessory muscle usage or respiratory distress.     Breath sounds: Normal breath sounds.  Abdominal:     General: Bowel sounds are normal. There is no distension.     Palpations: Abdomen  is soft.     Tenderness: There is no abdominal tenderness.  Musculoskeletal:     Cervical back: Full passive range of motion without pain.     Right lower leg: No edema.     Left lower leg: No edema.  Lymphadenopathy:     Cervical: No cervical adenopathy.  Skin:    General: Skin is warm.     Capillary Refill: Capillary refill takes less than 2 seconds.  Neurological:     Mental Status: He is alert and oriented to person, place, and time.     Deep Tendon Reflexes: Reflexes are normal and symmetric.     Reflex Scores:      Brachioradialis reflexes are 2+ on the right side and 2+ on the left side.      Patellar reflexes are 2+ on the right side and 2+ on the left side. Psychiatric:        Attention and Perception: Attention normal.        Mood and Affect: Mood normal.        Speech: Speech normal.        Behavior: Behavior normal. Behavior is cooperative.        Thought Content: Thought content normal.    Diabetic Foot Exam - Simple   Simple Foot Form Visual Inspection See comments: Yes Sensation Testing Intact to touch and monofilament testing bilaterally: Yes See comments: Yes Pulse Check Posterior Tibialis and Dorsalis pulse intact bilaterally: Yes Comments Dry skin     Results for orders placed or performed in visit on 03/21/22  HgB A1c  Result Value Ref Range   Hgb A1c MFr Bld 6.0 (H) 4.8 - 5.6 %   Est. average glucose Bld gHb Est-mCnc 126 mg/dL  Urine Microalbumin w/creat. ratio  Result Value Ref Range   Creatinine, Urine 116.5 Not Estab. mg/dL   Microalbumin, Urine <6.5 Not Estab. ug/mL   Microalb/Creat Ratio <3 0 - 29 mg/g creat  CBC with Differential/Platelet  Result Value Ref Range   WBC 9.0 3.4 - 10.8 x10E3/uL   RBC 5.20 4.14 - 5.80 x10E6/uL   Hemoglobin 15.7 13.0 - 17.7 g/dL   Hematocrit 78.4 69.6 - 51.0 %   MCV 89 79 - 97 fL   MCH 30.2 26.6 - 33.0 pg   MCHC 33.8 31.5 - 35.7 g/dL   RDW 29.5 28.4 - 13.2 %   Platelets 274 150 - 450 x10E3/uL    Neutrophils 63 Not Estab. %   Lymphs 26 Not Estab. %   Monocytes 7 Not Estab. %   Eos 2 Not Estab. %   Basos 1 Not Estab. %   Neutrophils Absolute 5.7 1.4 - 7.0 x10E3/uL   Lymphocytes Absolute 2.3 0.7 - 3.1 x10E3/uL   Monocytes Absolute 0.7 0.1 - 0.9 x10E3/uL   EOS (ABSOLUTE) 0.1 0.0 - 0.4 x10E3/uL   Basophils Absolute 0.1 0.0 - 0.2 x10E3/uL   Immature Granulocytes 1 Not  Estab. %   Immature Grans (Abs) 0.1 0.0 - 0.1 x10E3/uL  Comprehensive metabolic panel  Result Value Ref Range   Glucose 92 70 - 99 mg/dL   BUN 12 6 - 24 mg/dL   Creatinine, Ser 0.98 0.76 - 1.27 mg/dL   eGFR 95 >11 BJ/YNW/2.95   BUN/Creatinine Ratio 13 9 - 20   Sodium 138 134 - 144 mmol/L   Potassium 4.7 3.5 - 5.2 mmol/L   Chloride 105 96 - 106 mmol/L   CO2 20 20 - 29 mmol/L   Calcium 9.5 8.7 - 10.2 mg/dL   Total Protein 6.8 6.0 - 8.5 g/dL   Albumin 4.2 3.8 - 4.9 g/dL   Globulin, Total 2.6 1.5 - 4.5 g/dL   Albumin/Globulin Ratio 1.6 1.2 - 2.2   Bilirubin Total 0.4 0.0 - 1.2 mg/dL   Alkaline Phosphatase 93 44 - 121 IU/L   AST 34 0 - 40 IU/L   ALT 36 0 - 44 IU/L  Lipid Panel w/o Chol/HDL Ratio  Result Value Ref Range   Cholesterol, Total 124 100 - 199 mg/dL   Triglycerides 621 (H) 0 - 149 mg/dL   HDL 34 (L) >30 mg/dL   VLDL Cholesterol Cal 27 5 - 40 mg/dL   LDL Chol Calc (NIH) 63 0 - 99 mg/dL  TSH  Result Value Ref Range   TSH 2.410 0.450 - 4.500 uIU/mL  PSA  Result Value Ref Range   Prostate Specific Ag, Serum 0.7 0.0 - 4.0 ng/mL  Gamma GT  Result Value Ref Range   GGT 68 (H) 0 - 65 IU/L  VITAMIN D 25 Hydroxy (Vit-D Deficiency, Fractures)  Result Value Ref Range   Vit D, 25-Hydroxy 18.9 (L) 30.0 - 100.0 ng/mL  Magnesium  Result Value Ref Range   Magnesium 1.9 1.6 - 2.3 mg/dL      Assessment & Plan:   Problem List Items Addressed This Visit       Respiratory   OSA on CPAP    Chronic, stable.  Uses CPAP nightly.  Praised for this.        Endocrine   Hyperlipidemia associated with type 2  diabetes mellitus (HCC)    Chronic, ongoing.  Continue current medication regimen and adjust as needed.  Lipid panel today.      Relevant Orders   Bayer DCA Hb A1c Waived   Comprehensive metabolic panel   Lipid Panel w/o Chol/HDL Ratio   Type 2 diabetes mellitus with morbid obesity (HCC) - Primary    Chronic, stable with 6.4% today, mild trend up.  Urine ALB <10 March 2022.  Continue Jardiance only at this time, which is cost effective for him.  He agrees with this plan.  Continue to check BS every other day and document for visits.  Focus on diabetic diet.  Return in 6 months. - Eye exam up to date - Foot exam up to date - PCV20 needed, discussed with patient - No ACE or ARB -- will consider in future, but currently BP on lower side at baseline.  Educated him on this. - Statin on board.      Relevant Orders   Bayer DCA Hb A1c Waived     Other   Body mass index (BMI) of 50-59.9 in adult [facility])    Refer to morbid obesity plan of care.      Morbid obesity (HCC)    BMI 52.39.  Recommended eating smaller high protein, low fat meals more frequently and exercising 30  mins a day 5 times a week with a goal of 10-15lb weight loss in the next 3 months. Patient voiced their understanding and motivation to adhere to these recommendations.       SOB (shortness of breath)    On occasion in hot weather will notice this, suspect some reactive airway.  Non smoker.  Will send in Albuterol to use as needed to see if benefit from this and consider spirometry in future.  Recommend modest weight loss.        Follow up plan: Return in about 6 months (around 03/22/2023) for T2DM, HTN/HLD (annual labs due) + needs skin tag removal visit.

## 2022-09-19 NOTE — Assessment & Plan Note (Signed)
Chronic, stable.  Uses CPAP nightly.  Praised for this.

## 2022-09-19 NOTE — Assessment & Plan Note (Signed)
Refer to morbid obesity plan of care. 

## 2022-09-20 LAB — COMPREHENSIVE METABOLIC PANEL
ALT: 50 IU/L — ABNORMAL HIGH (ref 0–44)
AST: 38 IU/L (ref 0–40)
Albumin: 4.1 g/dL (ref 3.8–4.9)
Alkaline Phosphatase: 134 IU/L — ABNORMAL HIGH (ref 44–121)
BUN/Creatinine Ratio: 11 (ref 9–20)
BUN: 12 mg/dL (ref 6–24)
Bilirubin Total: 0.7 mg/dL (ref 0.0–1.2)
CO2: 23 mmol/L (ref 20–29)
Calcium: 9.3 mg/dL (ref 8.7–10.2)
Chloride: 103 mmol/L (ref 96–106)
Creatinine, Ser: 1.06 mg/dL (ref 0.76–1.27)
Globulin, Total: 2.6 g/dL (ref 1.5–4.5)
Glucose: 160 mg/dL — ABNORMAL HIGH (ref 70–99)
Potassium: 5 mmol/L (ref 3.5–5.2)
Sodium: 141 mmol/L (ref 134–144)
Total Protein: 6.7 g/dL (ref 6.0–8.5)
eGFR: 83 mL/min/{1.73_m2} (ref >59)

## 2022-09-20 LAB — LIPID PANEL W/O CHOL/HDL RATIO
Cholesterol, Total: 120 mg/dL (ref 100–199)
HDL: 35 mg/dL — ABNORMAL LOW (ref >39)
LDL Chol Calc (NIH): 59 mg/dL (ref 0–99)
Triglycerides: 153 mg/dL — ABNORMAL HIGH (ref 0–149)
VLDL Cholesterol Cal: 26 mg/dL (ref 5–40)

## 2022-09-20 NOTE — Progress Notes (Signed)
Contacted via MyChart   Good afternoon Casey Velez, your labs have returned: - Kidney function, creatinine and eGFR, remains normal.  Liver function shows mild elevation in ALT, but normal AST.  We will continue to monitor this. - Cholesterol levels show goal LDL, but triglycerides remain a little elevated.  Continue Atorvastatin and focus on diet at home. Any questions? Keep being amazing!!  Thank you for allowing me to participate in your care.  I appreciate you. Kindest regards, Laronn Devonshire

## 2022-10-21 NOTE — Patient Instructions (Signed)
Skin Tag, Adult A skin tag (acrochordon) is a soft, extra growth of skin. Most skin tags are skin-colored and rarely bigger than a pencil eraser. They often form in areas where there is frequent rubbing, or friction, on the skin. This may be where there are folds in the skin, such as: The eyelids. The neck. The armpits. The groin. Skin tags are not dangerous, and they do not spread from person to person (are not contagious). You may have one skin tag or many. Skin tags do not need treatment. However, your health care provider may recommend removing a skin tag if it: Gets irritated from clothing or jewelry. Bleeds. Is visible and unsightly. What are the causes? This condition is linked to: Increasing age. Pregnancy. Diabetes. Obesity. What are the signs or symptoms? Skin tags usually do not cause symptoms unless they get irritated by items touching your skin, such as clothing or jewelry. When this happens, you may have pain, itching, or bleeding. How is this diagnosed? This condition is diagnosed with an evaluation from your health care provider. No testing is needed for diagnosis. How is this treated? Treatment for this condition depends on whether you have symptoms. Your health care provider may also remove your skin tag if it is visible or if you do not like the way it looks. A skin tag can be removed by a health care provider with: A simple surgical procedure using scissors. A procedure that involves freezing your skin tag with a gas in liquid form (liquid nitrogen). A procedure that uses heat to destroy your skin tag (electrodessication). Follow these instructions at home: Watch for any changes in your skin tag. A normal skin tag does not require any other special care at home. Take over-the-counter and prescription medicines only as told by your health care provider. Keep all follow-up visits. Contact a health care provider if: You have a skin tag that: Becomes painful. Changes  color. Bleeds. Swells. Summary Skin tags are soft, extra growths of skin found in areas of frequent rubbing or friction. Skin tags usually do not cause symptoms. If symptoms occur, you may have pain, itching, or bleeding. Your health care provider may remove your skin tag if it causes symptoms or if you do not like the way it looks. This information is not intended to replace advice given to you by your health care provider. Make sure you discuss any questions you have with your health care provider. Document Revised: 03/08/2021 Document Reviewed: 03/08/2021 Elsevier Patient Education  2024 [employer].

## 2022-10-25 ENCOUNTER — Ambulatory Visit (INDEPENDENT_AMBULATORY_CARE_PROVIDER_SITE_OTHER): Payer: 59 | Admitting: Nurse Practitioner

## 2022-10-25 ENCOUNTER — Other Ambulatory Visit (HOSPITAL_COMMUNITY)
Admission: RE | Admit: 2022-10-25 | Discharge: 2022-10-25 | Disposition: A | Discharge status: Home / Self Care | Payer: 59 | Source: Ambulatory Visit | Attending: Nurse Practitioner | Admitting: Nurse Practitioner

## 2022-10-25 ENCOUNTER — Encounter: Payer: Self-pay | Admitting: Nurse Practitioner

## 2022-10-25 VITALS — BP 133/80 | HR 82 | Temp 98.2°F | Wt 325.2 lb

## 2022-10-25 DIAGNOSIS — L918 Other hypertrophic disorders of the skin: Secondary | ICD-10-CM | POA: Insufficient documentation

## 2022-10-25 DIAGNOSIS — Z23 Encounter for immunization: Secondary | ICD-10-CM

## 2022-10-25 NOTE — Progress Notes (Signed)
BP 133/80   Pulse 82   Temp 98.2 F (36.8 C) (Oral)   Wt (!) 325 lb 3.2 oz (147.5 kg)   SpO2 97%   BMI 52.49 kg/m    Subjective:    Patient ID: Casey Velez, male    DOB: Jul 01, 1965, 57 y.o.   MRN: [id number]  HPI: Casey Velez is a 57 y.o. male  Chief Complaint  Patient presents with   Skin Tag Removal    SKIN TAGS Presents today for discomfort from skin tags around neck area.  Reports they are getting irritated when clothing rubs against them. Duration: months Location: around neck area x 4 Painful: yes Itching: no Onset: gradual Context: becoming painful Associated signs and symptoms: painful History of skin cancer: no History of precancerous skin lesions: no Family history of skin cancer: no   Relevant past medical, surgical, family and social history reviewed and updated as indicated. Interim medical history since our last visit reviewed. Allergies and medications reviewed and updated.  Review of Systems  Constitutional:  Negative for activity change, diaphoresis, fatigue and fever.  Respiratory:  Negative for cough, chest tightness, shortness of breath and wheezing.   Cardiovascular:  Negative for chest pain, palpitations and leg swelling.  Gastrointestinal: Negative.   Skin:        Skin tags  Neurological: Negative.   Psychiatric/Behavioral: Negative.      Per HPI unless specifically indicated above     Objective:    BP 133/80   Pulse 82   Temp 98.2 F (36.8 C) (Oral)   Wt (!) 325 lb 3.2 oz (147.5 kg)   SpO2 97%   BMI 52.49 kg/m   Wt Readings from Last 3 Encounters:  10/25/22 (!) 325 lb 3.2 oz (147.5 kg)  09/19/22 (!) 324 lb 9.6 oz (147.2 kg)  03/21/22 (!) 320 lb 8 oz (145.4 kg)    Physical Exam Vitals and nursing note reviewed.  Constitutional:      General: He is awake. He is not in acute distress.    Appearance: He is well-developed and well-groomed. He is obese. He is not ill-appearing or toxic-appearing.  HENT:     Head:  Normocephalic.  Eyes:     General: Lids are normal.     Extraocular Movements: Extraocular movements intact.     Conjunctiva/sclera: Conjunctivae normal.  Neck:     Thyroid: No thyromegaly.     Vascular: No carotid bruit.  Cardiovascular:     Rate and Rhythm: Normal rate and regular rhythm.     Heart sounds: Normal heart sounds.  Pulmonary:     Effort: No accessory muscle usage or respiratory distress.     Breath sounds: Normal breath sounds.  Abdominal:     General: Bowel sounds are normal. There is no distension.     Palpations: Abdomen is soft.     Tenderness: There is no abdominal tenderness.  Musculoskeletal:     Cervical back: Full passive range of motion without pain.     Right lower leg: No edema.     Left lower leg: No edema.  Lymphadenopathy:     Cervical: No cervical adenopathy.  Skin:    General: Skin is warm.     Capillary Refill: Capillary refill takes less than 2 seconds.       Neurological:     Mental Status: He is alert and oriented to person, place, and time.     Deep Tendon Reflexes: Reflexes are normal and symmetric.  Reflex Scores:      Brachioradialis reflexes are 2+ on the right side and 2+ on the left side.      Patellar reflexes are 2+ on the right side and 2+ on the left side. Psychiatric:        Attention and Perception: Attention normal.        Mood and Affect: Mood normal.        Speech: Speech normal.        Behavior: Behavior normal. Behavior is cooperative.        Thought Content: Thought content normal.   Skin Procedure Procedure: Skin tag removal Informed consent:  Discussed risks (permanent scarring, infection, pain, bleeding, bruising, redness, and recurrence of the lesion) and benefits of the procedure, as well as the alternatives.  He is aware that skin tags are benign lesions, and their removal is often not considered medically necessary, however these skin tags are becoming painful.  Informed consent was obtained. Anesthesia:  Lidocaine spray  The area was prepared and draped in a standard fashion. Snip and Dermablade removal was performed.   Antibiotic ointment and a sterile dressing were applied.   The patient tolerated procedure well. The patient was instructed on post-op care.   Number of lesions removed:  4 sent to lab  Results for orders placed or performed in visit on 09/19/22  Bayer DCA Hb A1c Waived  Result Value Ref Range   HB A1C (BAYER DCA - WAIVED) 6.4 (H) 4.8 - 5.6 %  Comprehensive metabolic panel  Result Value Ref Range   Glucose 160 (H) 70 - 99 mg/dL   BUN 12 6 - 24 mg/dL   Creatinine, Ser 1.30 0.76 - 1.27 mg/dL   eGFR 83 >86 VH/QIO/9.62   BUN/Creatinine Ratio 11 9 - 20   Sodium 141 134 - 144 mmol/L   Potassium 5.0 3.5 - 5.2 mmol/L   Chloride 103 96 - 106 mmol/L   CO2 23 20 - 29 mmol/L   Calcium 9.3 8.7 - 10.2 mg/dL   Total Protein 6.7 6.0 - 8.5 g/dL   Albumin 4.1 3.8 - 4.9 g/dL   Globulin, Total 2.6 1.5 - 4.5 g/dL   Bilirubin Total 0.7 0.0 - 1.2 mg/dL   Alkaline Phosphatase 134 (H) 44 - 121 IU/L   AST 38 0 - 40 IU/L   ALT 50 (H) 0 - 44 IU/L  Lipid Panel w/o Chol/HDL Ratio  Result Value Ref Range   Cholesterol, Total 120 100 - 199 mg/dL   Triglycerides 952 (H) 0 - 149 mg/dL   HDL 35 (L) >84 mg/dL   VLDL Cholesterol Cal 26 5 - 40 mg/dL   LDL Chol Calc (NIH) 59 0 - 99 mg/dL      Assessment & Plan:   Problem List Items Addressed This Visit       Musculoskeletal and Integument   Skin tag, acquired - Primary    To neck area x 4 skin tags removed as they are becoming red and causing him pain.  He provided consent for procedure and tolerated well.  Return if any s/s infection present.  Educated on wound care.      Relevant Orders   Surgical pathology   Other Visit Diagnoses     Flu vaccine need       Flu vaccine due and provided in office today, educated patient and consent obtained.   Relevant Orders   Flu vaccine trivalent PF, 6mos and  older(Flulaval,Afluria,Fluarix,Fluzone)  Follow up plan: Return for as scheduled.Marland Kitchen

## 2022-10-25 NOTE — Assessment & Plan Note (Signed)
To neck area x 4 skin tags removed as they are becoming red and causing him pain.  He provided consent for procedure and tolerated well.  Return if any s/s infection present.  Educated on wound care.

## 2022-10-29 LAB — SURGICAL PATHOLOGY

## 2022-11-13 ENCOUNTER — Encounter: Payer: Self-pay | Admitting: Nurse Practitioner

## 2022-11-14 ENCOUNTER — Encounter: Payer: Self-pay | Admitting: Nurse Practitioner

## 2022-11-19 ENCOUNTER — Other Ambulatory Visit: Payer: Self-pay

## 2022-11-19 ENCOUNTER — Other Ambulatory Visit: Payer: PRIVATE HEALTH INSURANCE

## 2022-11-19 ENCOUNTER — Emergency Department: Payer: 59

## 2022-11-19 ENCOUNTER — Inpatient Hospital Stay (HOSPITAL_COMMUNITY)
Admit: 2022-11-19 | Discharge: 2022-11-19 | Disposition: A | Discharge status: Home / Self Care | Payer: 59 | Attending: Family Medicine | Admitting: Family Medicine

## 2022-11-19 ENCOUNTER — Inpatient Hospital Stay
Admission: EM | Admit: 2022-11-19 | Discharge: 2022-11-30 | DRG: 291 | Disposition: A | Discharge status: Home / Self Care | Payer: 59 | Attending: Internal Medicine | Admitting: Internal Medicine

## 2022-11-19 DIAGNOSIS — Z7901 Long term (current) use of anticoagulants: Secondary | ICD-10-CM

## 2022-11-19 DIAGNOSIS — Z860100 Personal history of colon polyps, unspecified: Secondary | ICD-10-CM

## 2022-11-19 DIAGNOSIS — I420 Dilated cardiomyopathy: Secondary | ICD-10-CM | POA: Diagnosis present

## 2022-11-19 DIAGNOSIS — E1169 Type 2 diabetes mellitus with other specified complication: Secondary | ICD-10-CM | POA: Diagnosis present

## 2022-11-19 DIAGNOSIS — E66813 Obesity, class 3: Secondary | ICD-10-CM | POA: Diagnosis not present

## 2022-11-19 DIAGNOSIS — I110 Hypertensive heart disease with heart failure: Principal | ICD-10-CM | POA: Diagnosis present

## 2022-11-19 DIAGNOSIS — I4891 Unspecified atrial fibrillation: Principal | ICD-10-CM | POA: Diagnosis present

## 2022-11-19 DIAGNOSIS — I429 Cardiomyopathy, unspecified: Secondary | ICD-10-CM | POA: Diagnosis not present

## 2022-11-19 DIAGNOSIS — Z794 Long term (current) use of insulin: Secondary | ICD-10-CM | POA: Diagnosis not present

## 2022-11-19 DIAGNOSIS — I4819 Other persistent atrial fibrillation: Secondary | ICD-10-CM | POA: Diagnosis present

## 2022-11-19 DIAGNOSIS — Z6841 Body Mass Index (BMI) 40.0 and over, adult: Secondary | ICD-10-CM | POA: Diagnosis not present

## 2022-11-19 DIAGNOSIS — M1990 Unspecified osteoarthritis, unspecified site: Secondary | ICD-10-CM | POA: Diagnosis present

## 2022-11-19 DIAGNOSIS — I5021 Acute systolic (congestive) heart failure: Secondary | ICD-10-CM | POA: Diagnosis present

## 2022-11-19 DIAGNOSIS — I77819 Aortic ectasia, unspecified site: Secondary | ICD-10-CM | POA: Diagnosis present

## 2022-11-19 DIAGNOSIS — E785 Hyperlipidemia, unspecified: Secondary | ICD-10-CM

## 2022-11-19 DIAGNOSIS — K219 Gastro-esophageal reflux disease without esophagitis: Secondary | ICD-10-CM | POA: Diagnosis present

## 2022-11-19 DIAGNOSIS — R0609 Other forms of dyspnea: Secondary | ICD-10-CM | POA: Diagnosis not present

## 2022-11-19 DIAGNOSIS — Z8249 Family history of ischemic heart disease and other diseases of the circulatory system: Secondary | ICD-10-CM

## 2022-11-19 DIAGNOSIS — I272 Pulmonary hypertension, unspecified: Secondary | ICD-10-CM | POA: Diagnosis present

## 2022-11-19 DIAGNOSIS — I081 Rheumatic disorders of both mitral and tricuspid valves: Secondary | ICD-10-CM | POA: Diagnosis present

## 2022-11-19 DIAGNOSIS — Z79899 Other long term (current) drug therapy: Secondary | ICD-10-CM

## 2022-11-19 DIAGNOSIS — I3139 Other pericardial effusion (noninflammatory): Secondary | ICD-10-CM | POA: Diagnosis present

## 2022-11-19 DIAGNOSIS — I517 Cardiomegaly: Secondary | ICD-10-CM | POA: Diagnosis not present

## 2022-11-19 DIAGNOSIS — Z7982 Long term (current) use of aspirin: Secondary | ICD-10-CM | POA: Diagnosis not present

## 2022-11-19 DIAGNOSIS — E559 Vitamin D deficiency, unspecified: Secondary | ICD-10-CM | POA: Diagnosis present

## 2022-11-19 DIAGNOSIS — Z83511 Family history of glaucoma: Secondary | ICD-10-CM

## 2022-11-19 DIAGNOSIS — I5041 Acute combined systolic (congestive) and diastolic (congestive) heart failure: Secondary | ICD-10-CM

## 2022-11-19 DIAGNOSIS — Z7984 Long term (current) use of oral hypoglycemic drugs: Secondary | ICD-10-CM

## 2022-11-19 DIAGNOSIS — Z833 Family history of diabetes mellitus: Secondary | ICD-10-CM

## 2022-11-19 DIAGNOSIS — G4733 Obstructive sleep apnea (adult) (pediatric): Secondary | ICD-10-CM | POA: Diagnosis present

## 2022-11-19 DIAGNOSIS — R0602 Shortness of breath: Secondary | ICD-10-CM | POA: Diagnosis present

## 2022-11-19 DIAGNOSIS — Z809 Family history of malignant neoplasm, unspecified: Secondary | ICD-10-CM

## 2022-11-19 DIAGNOSIS — I340 Nonrheumatic mitral (valve) insufficiency: Secondary | ICD-10-CM | POA: Diagnosis not present

## 2022-11-19 HISTORY — DX: Thoracic aortic ectasia: I77.810

## 2022-11-19 HISTORY — DX: Unspecified systolic (congestive) heart failure: I50.20

## 2022-11-19 HISTORY — DX: Cardiomyopathy, unspecified: I42.9

## 2022-11-19 HISTORY — DX: Other persistent atrial fibrillation: I48.19

## 2022-11-19 LAB — CBC WITH DIFFERENTIAL/PLATELET
Abs Immature Granulocytes: 0.04 10*3/uL (ref 0.00–0.07)
Basophils Absolute: 0.1 10*3/uL (ref 0.0–0.1)
Basophils Relative: 1 %
Eosinophils Absolute: 0.2 10*3/uL (ref 0.0–0.5)
Eosinophils Relative: 1 %
HCT: 47.7 % (ref 39.0–52.0)
Hemoglobin: 14.9 g/dL (ref 13.0–17.0)
Immature Granulocytes: 0 %
Lymphocytes Relative: 18 %
Lymphs Abs: 2 10*3/uL (ref 0.7–4.0)
MCH: 28.9 pg (ref 26.0–34.0)
MCHC: 31.2 g/dL (ref 30.0–36.0)
MCV: 92.6 fL (ref 80.0–100.0)
Monocytes Absolute: 0.9 10*3/uL (ref 0.1–1.0)
Monocytes Relative: 8 %
Neutro Abs: 8 10*3/uL — ABNORMAL HIGH (ref 1.7–7.7)
Neutrophils Relative %: 72 %
Platelets: 270 10*3/uL (ref 150–400)
RBC: 5.15 MIL/uL (ref 4.22–5.81)
RDW: 13.9 % (ref 11.5–15.5)
WBC: 11.2 10*3/uL — ABNORMAL HIGH (ref 4.0–10.5)
nRBC: 0 % (ref 0.0–0.2)

## 2022-11-19 LAB — URINE DRUG SCREEN, QUALITATIVE (ARMC ONLY)
Amphetamines, Ur Screen: NOT DETECTED
Barbiturates, Ur Screen: NOT DETECTED
Benzodiazepine, Ur Scrn: NOT DETECTED
Cannabinoid 50 Ng, Ur ~~LOC~~: NOT DETECTED
Cocaine Metabolite,Ur ~~LOC~~: NOT DETECTED
MDMA (Ecstasy)Ur Screen: NOT DETECTED
Methadone Scn, Ur: NOT DETECTED
Opiate, Ur Screen: NOT DETECTED
Phencyclidine (PCP) Ur S: NOT DETECTED
Tricyclic, Ur Screen: NOT DETECTED

## 2022-11-19 LAB — COMPREHENSIVE METABOLIC PANEL
ALT: 20 U/L (ref 0–44)
AST: 26 U/L (ref 15–41)
Albumin: 3.8 g/dL (ref 3.5–5.0)
Alkaline Phosphatase: 83 U/L (ref 38–126)
Anion gap: 12 (ref 5–15)
BUN: 23 mg/dL — ABNORMAL HIGH (ref 6–20)
CO2: 22 mmol/L (ref 22–32)
Calcium: 8.8 mg/dL — ABNORMAL LOW (ref 8.9–10.3)
Chloride: 106 mmol/L (ref 98–111)
Creatinine, Ser: 1.27 mg/dL — ABNORMAL HIGH (ref 0.61–1.24)
GFR, Estimated: >60 mL/min (ref >60)
Glucose, Bld: 105 mg/dL — ABNORMAL HIGH (ref 70–99)
Potassium: 4 mmol/L (ref 3.5–5.1)
Sodium: 140 mmol/L (ref 135–145)
Total Bilirubin: 0.7 mg/dL (ref 0.3–1.2)
Total Protein: 7.2 g/dL (ref 6.5–8.1)

## 2022-11-19 LAB — TROPONIN I (HIGH SENSITIVITY)
Troponin I (High Sensitivity): 8 ng/L (ref <18)
Troponin I (High Sensitivity): 9 ng/L (ref <18)

## 2022-11-19 LAB — LIPID PANEL
Cholesterol: 56 mg/dL (ref 0–200)
HDL: 18 mg/dL — ABNORMAL LOW (ref >40)
LDL Cholesterol: 33 mg/dL (ref 0–99)
Total CHOL/HDL Ratio: 3.1 {ratio}
Triglycerides: 25 mg/dL (ref <150)
VLDL: 5 mg/dL (ref 0–40)

## 2022-11-19 LAB — APTT: aPTT: 32 s (ref 24–36)

## 2022-11-19 LAB — BRAIN NATRIURETIC PEPTIDE: B Natriuretic Peptide: 334.5 pg/mL — ABNORMAL HIGH (ref 0.0–100.0)

## 2022-11-19 LAB — PROTIME-INR
INR: 1.3 — ABNORMAL HIGH (ref 0.8–1.2)
Prothrombin Time: 16 s — ABNORMAL HIGH (ref 11.4–15.2)

## 2022-11-19 LAB — MAGNESIUM: Magnesium: 1.9 mg/dL (ref 1.7–2.4)

## 2022-11-19 LAB — HEPARIN LEVEL (UNFRACTIONATED): Heparin Unfractionated: 0.56 [IU]/mL (ref 0.30–0.70)

## 2022-11-19 LAB — TSH: TSH: 2.29 u[IU]/mL (ref 0.350–4.500)

## 2022-11-19 LAB — GLUCOSE, CAPILLARY: Glucose-Capillary: 102 mg/dL — ABNORMAL HIGH (ref 70–99)

## 2022-11-19 LAB — HIV ANTIBODY (ROUTINE TESTING W REFLEX): HIV Screen 4th Generation wRfx: NONREACTIVE

## 2022-11-19 MED ORDER — HEPARIN BOLUS VIA INFUSION
4000.0000 [IU] | Freq: Once | INTRAVENOUS | Status: AC
  Administered 2022-11-19: 4000 [IU] via INTRAVENOUS
  Filled 2022-11-19: qty 4000

## 2022-11-19 MED ORDER — DILTIAZEM HCL-DEXTROSE 125-5 MG/125ML-% IV SOLN (PREMIX)
5.0000 mg/h | INTRAVENOUS | Status: DC
  Administered 2022-11-19: 5 mg/h via INTRAVENOUS
  Administered 2022-11-19 – 2022-11-20 (×2): 10 mg/h via INTRAVENOUS
  Filled 2022-11-19 (×3): qty 125

## 2022-11-19 MED ORDER — HEPARIN (PORCINE) 25000 UT/250ML-% IV SOLN
2100.0000 [IU]/h | INTRAVENOUS | Status: DC
  Administered 2022-11-19 – 2022-11-20 (×3): 1500 [IU]/h via INTRAVENOUS
  Administered 2022-11-21 – 2022-11-22 (×2): 1800 [IU]/h via INTRAVENOUS
  Filled 2022-11-19 (×5): qty 250

## 2022-11-19 MED ORDER — ONDANSETRON HCL 4 MG/2ML IJ SOLN: 4.0000 mg | Freq: Four times a day (QID) | INTRAMUSCULAR | Status: DC | PRN

## 2022-11-19 MED ORDER — INSULIN ASPART 100 UNIT/ML IJ SOLN
0.0000 [IU] | Freq: Three times a day (TID) | INTRAMUSCULAR | Status: DC
  Filled 2022-11-19 (×3): qty 1

## 2022-11-19 MED ORDER — PERFLUTREN LIPID MICROSPHERE
1.0000 mL | INTRAVENOUS | Status: AC | PRN
  Administered 2022-11-19: 3 mL via INTRAVENOUS

## 2022-11-19 MED ORDER — DILTIAZEM LOAD VIA INFUSION
10.0000 mg | Freq: Once | INTRAVENOUS | Status: AC
  Administered 2022-11-19: 10 mg via INTRAVENOUS
  Filled 2022-11-19: qty 10

## 2022-11-19 MED ORDER — ENOXAPARIN SODIUM 80 MG/0.8ML IJ SOSY
75.0000 mg | PREFILLED_SYRINGE | INTRAMUSCULAR | Status: DC
  Administered 2022-11-19: 75 mg via SUBCUTANEOUS
  Filled 2022-11-19: qty 0.75

## 2022-11-19 MED ORDER — ONDANSETRON HCL 4 MG PO TABS: 4.0000 mg | ORAL_TABLET | Freq: Four times a day (QID) | ORAL | Status: DC | PRN

## 2022-11-19 NOTE — Assessment & Plan Note (Signed)
BMI in 50s Discussed diet and lifestyle modification

## 2022-11-19 NOTE — Consult Note (Signed)
Cardiology Consultation   Patient ID: Casey Velez MRN: [id number]; DOB: [birth year]  Admit date: 11/19/2022 Date of Consult: 11/19/2022  PCP:  Marjie Skiff, NP   [facility] Providers Cardiologist:  New   Patient Profile:   Casey Velez is a 57 y.o. male with a hx of obesity, DM2, HLD, OSA on CPAP who is being seen 11/19/2022 for the evaluation of new onset afib at the request of Dr. Para March.  History of Present Illness:   Casey Velez has not been seen by cardiology before. He works as a Curator. He has sleep apnea and uses his machine. He says his father had heart issues, but is unsure which ones. He denies alcohol, tobacco and drug history.  The patient presented with shortness of breath for the last month. Reports shortness of breath was severe with exertion. He denied any chest pain. He did note some lightheadedness and dizziness. Phe denies fever, chills, lower leg edema, palpitations or heart racing. When breathing did not improve, he came to the ER.   In the ER BP 112/97, RR 160bpm, RR 19, 100% O2.Labs WBC 11.2, BG 105, Scr 1.27, BUN 23, Mag 1.9, BNP 334,  TSH mwnl.  EKG showed Afib RVR with rates in the 150s started on IV dilt. CXR clear.    Past Medical History:  Diagnosis Date   Allergy    Arthritis    Colon polyps    Diabetes mellitus, type 2 (HCC)    Fatigue 04/15/2020   GERD (gastroesophageal reflux disease)    Hydrocele, left    Hyperlipidemia 05/23/2020   Morbid obesity (HCC)    Screening for malignant neoplasm of prostate 04/15/2020   Sleep apnea    Vitamin D deficiency     Past Surgical History:  Procedure Laterality Date   APPENDECTOMY     COLONOSCOPY WITH PROPOFOL  04/26/2017   COLONOSCOPY WITH PROPOFOL N/A 05/27/2020   Procedure: COLONOSCOPY WITH PROPOFOL;  Surgeon: Pasty Spillers, MD;  Location: [facility] ENDOSCOPY;  Service: Endoscopy;  Laterality: N/A;   HYDROCELE EXCISION Left 04/06/2021   Procedure: HYDROCELECTOMY ADULT;   Surgeon: Orson Ape, MD;  Location: [facility] ORS;  Service: Urology;  Laterality: Left;     Home Medications:  Prior to Admission medications   Medication Sig Start Date End Date Taking? Authorizing Provider  albuterol (VENTOLIN HFA) 108 (90 Base) MCG/ACT inhaler Inhale 2 puffs into the lungs every 6 (six) hours as needed. 09/19/22   Aura Dials T, NP  aspirin EC 81 MG tablet Take 81 mg by mouth daily. Swallow whole.    [provider]  atorvastatin (LIPITOR) 80 MG tablet Take 1 tablet (80 mg total) by mouth daily. 05/28/22   Marjie Skiff, NP  blood glucose meter kit and supplies Dispense based on patient and insurance preference. Check glucose in the morning fasting and before meals. Use up to four times daily as directed. (FOR ICD-10 E10.9, E11.9). 05/16/20   Flinchum, Eula Fried, FNP  empagliflozin (JARDIANCE) 10 MG TABS tablet TAKE 1 TABLET(10 MG) BY MOUTH DAILY 06/14/22   Cannady, Corrie Dandy T, NP  famotidine (PEPCID) 20 MG tablet Take 20 mg by mouth daily.    [provider]  loratadine (CLARITIN) 10 MG tablet Take 10 mg by mouth daily. Patient not taking: Reported on 10/25/2022    [provider]  Multiple Vitamin (MULTIVITAMIN WITH MINERALS) TABS tablet Take 1 tablet by mouth daily.    [provider]  Inpatient Medications: Scheduled Meds:  enoxaparin (LOVENOX) injection  75 mg Subcutaneous Q24H   Continuous Infusions:  diltiazem (CARDIZEM) infusion 15 mg/hr (11/19/22 0648)   PRN Meds: ondansetron **OR** ondansetron (ZOFRAN) IV  Allergies:   No Known Allergies  Social History:   Social History   Socioeconomic History   Marital status: Single    Spouse name: Not on file   Number of children: Not on file   Years of education: Not on file   Highest education level: Not on file  Occupational History   Not on file  Tobacco Use   Smoking status: Never   Smokeless tobacco: Former    Types: Chew   Tobacco comments:    High school   Vaping Use   Vaping status: Never Used  Substance and Sexual Activity   Alcohol use: Yes    Comment: rarely   Drug use: Never   Sexual activity: Not on file  Other Topics Concern   Not on file  Social History Narrative   Lives alone   Social Determinants of Health   Financial Resource Strain: Not on file  Food Insecurity: Not on file  Transportation Needs: Not on file  Physical Activity: Not on file  Stress: Not on file  Social Connections: Not on file  Intimate Partner Violence: Not on file    Family History:    Family History  Problem Relation Age of Onset   Diabetes Father    Glaucoma Father    Cancer Paternal Grandfather      ROS:  Please see the history of present illness.   All other ROS reviewed and negative.     Physical Exam/Data:   Vitals:   11/19/22 0608 11/19/22 0630 11/19/22 0700 11/19/22 0730  BP:  (!) 107/90 109/85 (!) 115/94  Pulse: (!) 113 (!) 102 (!) 143 (!) 113  Resp: 19 14 (!) 22 17  Temp:      TempSrc:      SpO2: 97% 94% 97% 96%  Weight:      Height:       No intake or output data in the 24 hours ending 11/19/22 0804    11/19/2022    5:28 AM 10/25/2022    8:40 AM 09/19/2022    8:12 AM  Last 3 Weights  Weight (lbs) 330 lb 325 lb 3.2 oz 324 lb 9.6 oz  Weight (kg) 149.687 kg 147.51 kg 147.238 kg     Body mass index is 53.26 kg/m.  General:  Well nourished, well developed, in no acute distress HEENT: normal Neck: no JVD Vascular: No carotid bruits; Distal pulses 2+ bilaterally Cardiac:  normal S1, S2; Irreg Irreg; no murmur  Lungs:  clear to auscultation bilaterally, no wheezing, rhonchi or rales  Abd: soft, nontender, no hepatomegaly  Ext: no edema Musculoskeletal:  No deformities, BUE and BLE strength normal and equal Skin: warm and dry  Neuro:  CNs 2-12 intact, no focal abnormalities noted Psych:  Normal affect   EKG:  The EKG was personally reviewed and demonstrates:  Afib 150s Telemetry:  Telemetry was personally  reviewed and demonstrates:  Afib 150s>>>110s  Relevant CV Studies:  Echo has been ordered  Laboratory Data:  High Sensitivity Troponin:   Recent Labs  Lab 11/19/22 0533  TROPONINIHS 8     Chemistry Recent Labs  Lab 11/19/22 0533  NA 140  K 4.0  CL 106  CO2 22  GLUCOSE 105*  BUN 23*  CREATININE 1.27*  CALCIUM 8.8*  MG  1.9  GFRNONAA >60  ANIONGAP 12    Recent Labs  Lab 11/19/22 0533  PROT 7.2  ALBUMIN 3.8  AST 26  ALT 20  ALKPHOS 83  BILITOT 0.7   Lipids No results for input(s): "CHOL", "TRIG", "HDL", "LABVLDL", "LDLCALC", "CHOLHDL" in the last 168 hours.  Hematology Recent Labs  Lab 11/19/22 0533  WBC 11.2*  RBC 5.15  HGB 14.9  HCT 47.7  MCV 92.6  MCH 28.9  MCHC 31.2  RDW 13.9  PLT 270   Thyroid  Recent Labs  Lab 11/19/22 0638  TSH 2.290    BNPNo results for input(s): "BNP", "PROBNP" in the last 168 hours.  DDimer No results for input(s): "DDIMER" in the last 168 hours.   Radiology/Studies:  DG Chest Port 1 View  Result Date: 11/19/2022 CLINICAL DATA:  57 year old male with history of shortness of breath. EXAM: PORTABLE CHEST 1 VIEW COMPARISON:  Chest x-ray 05/08/2017. FINDINGS: Lung volumes are normal. No consolidative airspace disease. No pleural effusions. No pneumothorax. No evidence of pulmonary edema. Enlargement of the cardiopericardial silhouette, new compared to the prior study. Upper mediastinal contours are within normal limits. IMPRESSION: New enlargement of the cardiopericardial silhouette, which could reflect cardiomegaly and/or underlying pericardial effusion. Further evaluation with echocardiography is suggested if clinically appropriate. Electronically Signed   By: Trudie Reed M.D.   On: 11/19/2022 07:15     Assessment and Plan:   New onset Afib with RVR - patient presented with 1 month of exertional SOB found to be in rapid afib started on IV dilt - unclear chronicity of afib - rates improved on dilt, but BP is low -  HS trop negative x 2 - CHADSVASC at least 1-2 (DM2, ?CHF) I will start IV heparin. Will discuss long-term a/c with MD - Keep Mag>2 and K>4 - TSH wnl - check echo - may need TEE/DCCV if patient doesn't self convert  HLD - LDL 33 -PTA Lipitor 80mg  daily  Elevated BNP - BNP 334 - difficult to assess volume status given body habitus - check echo as above  DM2 - most recent A1C 6.4 - per IM  OSA - he reports compliance with CPAP   For questions or updates, please contact [facility] Please consult [email or web address] for contact info under    Signed, Sophie Tamez David Stall, PA-C  11/19/2022 8:04 AM

## 2022-11-19 NOTE — Assessment & Plan Note (Signed)
Positive cardiomegaly on imaging with presentation of shortness of breath and new onset A-fib with RVR BMP pending 2D echo Diuresis appropriate Follow-up cardiology recommendations

## 2022-11-19 NOTE — ED Triage Notes (Addendum)
Pt arrives via POV for SOB x1 month with exertion and productive cough - states nothing got worse for him to come in tonight but wants to get checked out. Ambulatory to triage without difficulty. EKG HR 160 - pt denies CP/lightheadedness/dizziness/blurred vision at this time.

## 2022-11-19 NOTE — Assessment & Plan Note (Signed)
PPI ?

## 2022-11-19 NOTE — ED Provider Notes (Signed)
[facility] Provider Note    Event Date/Time   First MD Initiated Contact with Patient 11/19/22 [phone]     (approximate)   History   Shortness of Breath   HPI  Casey Velez is a 57 y.o. male with history of obesity, diabetes, hyperlipidemia who presents to the emergency department shortness of breath worse with exertion for the past month.  No chest discomfort.  He does not feel his heart racing and does not feel that his heartbeat is irregular.  No fevers or cough.  No lower extremity swelling or pain.  Was given albuterol inhaler by his PCP.  Found to be in A-fib with RVR today.  Denies any history of arrhythmia.   History provided by patient.    Past Medical History:  Diagnosis Date   Allergy    Arthritis    Colon polyps    Diabetes mellitus, type 2 (HCC)    Fatigue 04/15/2020   GERD (gastroesophageal reflux disease)    Hydrocele, left    Hyperlipidemia 05/23/2020   Morbid obesity (HCC)    Screening for malignant neoplasm of prostate 04/15/2020   Sleep apnea    Vitamin D deficiency     Past Surgical History:  Procedure Laterality Date   APPENDECTOMY     COLONOSCOPY WITH PROPOFOL  04/26/2017   COLONOSCOPY WITH PROPOFOL N/A 05/27/2020   Procedure: COLONOSCOPY WITH PROPOFOL;  Surgeon: Pasty Spillers, MD;  Location: [facility] ENDOSCOPY;  Service: Endoscopy;  Laterality: N/A;   HYDROCELE EXCISION Left 04/06/2021   Procedure: HYDROCELECTOMY ADULT;  Surgeon: Orson Ape, MD;  Location: [facility] ORS;  Service: Urology;  Laterality: Left;    MEDICATIONS:  Prior to Admission medications   Medication Sig Start Date End Date Taking? Authorizing Provider  albuterol (VENTOLIN HFA) 108 (90 Base) MCG/ACT inhaler Inhale 2 puffs into the lungs every 6 (six) hours as needed. 09/19/22   Aura Dials T, NP  aspirin EC 81 MG tablet Take 81 mg by mouth daily. Swallow whole.    [provider]  atorvastatin (LIPITOR) 80 MG tablet Take 1 tablet (80 mg  total) by mouth daily. 05/28/22   Marjie Skiff, NP  blood glucose meter kit and supplies Dispense based on patient and insurance preference. Check glucose in the morning fasting and before meals. Use up to four times daily as directed. (FOR ICD-10 E10.9, E11.9). 05/16/20   Flinchum, Eula Fried, FNP  empagliflozin (JARDIANCE) 10 MG TABS tablet TAKE 1 TABLET(10 MG) BY MOUTH DAILY 06/14/22   Cannady, Corrie Dandy T, NP  famotidine (PEPCID) 20 MG tablet Take 20 mg by mouth daily.    [provider]  loratadine (CLARITIN) 10 MG tablet Take 10 mg by mouth daily. Patient not taking: Reported on 10/25/2022    [provider]  Multiple Vitamin (MULTIVITAMIN WITH MINERALS) TABS tablet Take 1 tablet by mouth daily.    [provider]    Physical Exam   Triage Vital Signs: ED Triage Vitals  Encounter Vitals Group     BP 11/19/22 0533 (!) 112/97     Systolic BP Percentile --      Diastolic BP Percentile --      Pulse Rate 11/19/22 0533 (!) 160     Resp 11/19/22 0533 19     Temp 11/19/22 0533 98.7 F (37.1 C)     Temp Source 11/19/22 0533 Oral     SpO2 11/19/22 0533 100 %     Weight 11/19/22 0528 Marland Kitchen)  330 lb (149.7 kg)     Height 11/19/22 0528 5\' 6"  (1.676 m)     Head Circumference --      Peak Flow --      Pain Score 11/19/22 0528 0     Pain Loc --      Pain Education --      Exclude from Growth Chart --     Most recent vital signs: Vitals:   11/19/22 0608 11/19/22 0630  BP:  (!) 107/90  Pulse: (!) 113 (!) 102  Resp: 19 14  Temp:    SpO2: 97% 94%    CONSTITUTIONAL: Alert, responds appropriately to questions. Well-appearing; well-nourished HEAD: Normocephalic, atraumatic EYES: Conjunctivae clear, pupils appear equal, sclera nonicteric ENT: normal nose; moist mucous membranes NECK: Supple, normal ROM CARD: Irregularly irregular and tachycardic; S1 and S2 appreciated RESP: Normal chest excursion without splinting or tachypnea; breath sounds clear and equal  bilaterally; no wheezes, no rhonchi, no rales, no hypoxia or respiratory distress, speaking full sentences ABD/GI: Non-distended; soft, non-tender, no rebound, no guarding, no peritoneal signs BACK: The back appears normal EXT: Normal ROM in all joints; no deformity noted, no edema, no calf tenderness or calf swelling SKIN: Normal color for age and race; warm; no rash on exposed skin NEURO: Moves all extremities equally, normal speech PSYCH: The patient's mood and manner are appropriate.   ED Results / Procedures / Treatments   LABS: (all labs ordered are listed, but only abnormal results are displayed) Labs Reviewed  CBC WITH DIFFERENTIAL/PLATELET - Abnormal; Notable for the following components:      Result Value   WBC 11.2 (*)    Neutro Abs 8.0 (*)    All other components within normal limits  COMPREHENSIVE METABOLIC PANEL - Abnormal; Notable for the following components:   Glucose, Bld 105 (*)    BUN 23 (*)    Creatinine, Ser 1.27 (*)    Calcium 8.8 (*)    All other components within normal limits  MAGNESIUM  TSH  TROPONIN I (HIGH SENSITIVITY)     EKG:  EKG Interpretation Date/Time:  Monday November 19 2022 05:32:48 EDT Ventricular Rate:  154 PR Interval:    QRS Duration:  88 QT Interval:  304 QTC Calculation: 486 R Axis:   219  Text Interpretation: Atrial fibrillation with rapid ventricular response Right superior axis deviation Right ventricular hypertrophy Cannot rule out Anterior infarct , age undetermined Abnormal ECG When compared with ECG of 04-Apr-2021 09:21, Atrial fibrillation has replaced Sinus rhythm Vent. rate has increased BY  76 BPM QRS axis Shifted left Nonspecific T wave abnormality now evident in Anterior leads Confirmed by Decklan Mau, Baxter Hire [phone]) on 11/19/2022 5:42:18 AM         RADIOLOGY: My personal review and interpretation of imaging: Chest x-ray clear.  I have personally reviewed all radiology reports.   No results  found.   PROCEDURES:  Critical Care performed: Yes, see critical care procedure note(s)   CRITICAL CARE Performed by: Rochele Raring   Total critical care time: 40 minutes  Critical care time was exclusive of separately billable procedures and treating other patients.  Critical care was necessary to treat or prevent imminent or life-threatening deterioration.  Critical care was time spent personally by me on the following activities: development of treatment plan with patient and/or surrogate as well as nursing, discussions with consultants, evaluation of patient's response to treatment, examination of patient, obtaining history from patient or surrogate, ordering and performing treatments and interventions, ordering  and review of laboratory studies, ordering and review of radiographic studies, pulse oximetry and re-evaluation of patient's condition.   Marland Kitchen1-3 Lead EKG Interpretation  Performed by: Inanna Telford, Layla Maw, DO Authorized by: Pattijo Juste, Layla Maw, DO     Interpretation: abnormal     ECG rate:  134   ECG rate assessment: tachycardic     Rhythm: atrial fibrillation     Ectopy: none     Conduction: normal       IMPRESSION / MDM / ASSESSMENT AND PLAN / ED COURSE  I reviewed the triage vital signs and the nursing notes.    Patient here with shortness of breath worse with exertion.  Found to have A-fib with RVR.  The patient is on the cardiac monitor to evaluate for evidence of arrhythmia and/or significant heart rate changes.   DIFFERENTIAL DIAGNOSIS (includes but not limited to):   A-fib with RVR, ACS, CHF, PE, pneumonia, electrolyte derangement, thyroid dysfunction   Patient's presentation is most consistent with acute presentation with potential threat to life or bodily function.   PLAN: Will obtain cardiac labs, chest x-ray.  Will check magnesium, TSH level.  Will start Cardizem.  Patient will need admission.  CHA2DS2-VASc 2 score is 1.   MEDICATIONS GIVEN IN  ED: Medications  diltiazem (CARDIZEM) 1 mg/mL load via infusion 10 mg (10 mg Intravenous Bolus from Bag 11/19/22 0557)    And  diltiazem (CARDIZEM) 125 mg in dextrose 5% 125 mL (1 mg/mL) infusion (15 mg/hr Intravenous Rate/Dose Change 11/19/22 0648)     ED COURSE: Troponin negative.  Normal electrolytes.  Normal hemoglobin.  Chest x-ray reviewed and interpreted by myself and shows cardiomegaly but no overt edema, infiltrate.  Official read from radiologist pending.  Will discuss with hospitalist for admission.   CONSULTS:  Consulted and discussed patient's case with hospitalist, Dr. Para March.  I have recommended admission and consulting physician agrees and will place admission orders.  Patient (and family if present) agree with this plan.   I reviewed all nursing notes, vitals, pertinent previous records.  All labs, EKGs, imaging ordered have been independently reviewed and interpreted by myself.    OUTSIDE RECORDS REVIEWED: Reviewed last family medicine visits on 09/19/2022 and 10/25/2022.       FINAL CLINICAL IMPRESSION(S) / ED DIAGNOSES   Final diagnoses:  Atrial fibrillation with RVR (HCC)  DOE (dyspnea on exertion)     Rx / DC Orders   ED Discharge Orders     None        Note:  This document was prepared using Dragon voice recognition software and may include unintentional dictation errors.   Artavius Stearns, Layla Maw, DO 11/19/22 0700

## 2022-11-19 NOTE — Consult Note (Signed)
Pharmacy Consult Note - Anticoagulation  Pharmacy Consult for heparin Indication: atrial fibrillation  PATIENT MEASUREMENTS: Height: 5\' 6"  (167.6 cm) Weight: (!) 149.7 kg (330 lb) IBW/kg (Calculated) : 63.8 HEPARIN DW (KG): 100.7  VITAL SIGNS: Temp: 98.1 F (36.7 C) (10/14 0900) Temp Source: Oral (10/14 0900) BP: 92/54 (10/14 0900) Pulse Rate: 103 (10/14 0900)  Recent Labs    11/19/22 0533 11/19/22 0734  HGB 14.9  --   HCT 47.7  --   PLT 270  --   CREATININE 1.27*  --   TROPONINIHS 8 9    Estimated Creatinine Clearance: 91.3 mL/min (A) (by C-G formula based on SCr of 1.27 mg/dL (H)).  PAST MEDICAL HISTORY: Past Medical History:  Diagnosis Date   Allergy    Arthritis    Colon polyps    Diabetes mellitus, type 2 (HCC)    Fatigue 04/15/2020   GERD (gastroesophageal reflux disease)    Hydrocele, left    Hyperlipidemia 05/23/2020   Morbid obesity (HCC)    Screening for malignant neoplasm of prostate 04/15/2020   Sleep apnea    Vitamin D deficiency     ASSESSMENT: 57 y.o. male with PMH obesity, T2DM, HLD, OSA on CPAP is presenting with atrial fibrillation. Patient is not on chronic anticoagulation per chart review. HR currently uncontrolled and ECG notable for Afib with RVR as well as right ventricular hypertrophy. CHADSVASc appears to be 1 or 2. Pharmacy has been consulted to initiate and manage heparin intravenous infusion.  Pertinent medications: Enoxaparin 75 mg subQ given morning of 10/14  Goal(s) of therapy: Heparin level 0.3 - 0.7 units/mL Monitor platelets by anticoagulation protocol: Yes   Baseline anticoagulation labs: Recent Labs    11/19/22 0533  HGB 14.9  PLT 270    Date Time aPTT/HL Rate/Comment      PLAN: Give 4000 units bolus x1; then start heparin infusion at 1500 units/hour. Reducing from otherwise load of 6000 units due to proximity of obesity-adjusted prophylactic enoxaparin dose this morning Check heparin level in 6 hours, then  daily once at least two levels are consecutively therapeutic. Monitor CBC daily while on heparin infusion.   Will M. Dareen Piano, PharmD Clinical Pharmacist 11/19/2022 9:54 AM

## 2022-11-19 NOTE — H&P (Addendum)
History and Physical    [name]:[id number] DOB: 19-May-1965 DOA: 11/19/2022 DOS: the patient was seen and examined on 11/19/2022 PCP: Casey Skiff, NP  Patient coming from: Home  Chief Complaint:  Chief Complaint  Patient presents with   Shortness of Breath   HPI: [name] is a 57 y.o. male with medical history significant of morbid obesity, type 2 diabetes, hyperlipidemia presenting with A-fib with RVR.  Patient reports roughly 1 month of worsening shortness of breath or increased work of breathing.  Patient works in a [employer].  Initially started roughly 4 weeks ago when patient was doing multiple oil changes.  Shortness of breath fairly constant though most predominant with exertion.  Positive mild palpitations.  No chest pain.  No nausea or vomiting.  Denies any known history of atrial fibrillation, heart failure in the past.  Baseline type 2 diabetes-fairly well-controlled.  Not physically active.  No reported alcohol or tobacco use.  Does drink sweet tea on a regular basis.  Denies any excessive caffeine intake apart from sweet tea.  No fevers or chills. Presented to the ER afebrile, heart rate in the 150s to 160s, BP stable.  Satting well on room air.  EKG with A-fib with RVR.  White count of 1.2, hemoglobin 14.9, platelets 270, creatinine 1.27, glucose 105.  Troponin negative x 1.  Chest x-ray with enlarged cardiac silhouette versus cardiomegaly. Review of Systems: As mentioned in the history of present illness. All other systems reviewed and are negative. Past Medical History:  Diagnosis Date   Allergy    Arthritis    Colon polyps    Diabetes mellitus, type 2 (HCC)    Fatigue 04/15/2020   GERD (gastroesophageal reflux disease)    Hydrocele, left    Hyperlipidemia 05/23/2020   Morbid obesity (HCC)    Screening for malignant neoplasm of prostate 04/15/2020   Sleep apnea    Vitamin D deficiency    Past Surgical History:  Procedure  Laterality Date   APPENDECTOMY     COLONOSCOPY WITH PROPOFOL  04/26/2017   COLONOSCOPY WITH PROPOFOL N/A 05/27/2020   Procedure: COLONOSCOPY WITH PROPOFOL;  Surgeon: Pasty Spillers, MD;  Location: [facility] ENDOSCOPY;  Service: Endoscopy;  Laterality: N/A;   HYDROCELE EXCISION Left 04/06/2021   Procedure: HYDROCELECTOMY ADULT;  Surgeon: Orson Ape, MD;  Location: [facility] ORS;  Service: Urology;  Laterality: Left;   Social History:  reports that he has never smoked. He has quit using smokeless tobacco.  His smokeless tobacco use included chew. He reports current alcohol use. He reports that he does not use drugs.  No Known Allergies  Family History  Problem Relation Age of Onset   Diabetes Father    Glaucoma Father    Cancer Paternal Grandfather     Prior to Admission medications   Medication Sig Start Date End Date Taking? Authorizing Provider  albuterol (VENTOLIN HFA) 108 (90 Base) MCG/ACT inhaler Inhale 2 puffs into the lungs every 6 (six) hours as needed. 09/19/22   Aura Dials T, NP  aspirin EC 81 MG tablet Take 81 mg by mouth daily. Swallow whole.    [provider]  atorvastatin (LIPITOR) 80 MG tablet Take 1 tablet (80 mg total) by mouth daily. 05/28/22   Casey Skiff, NP  blood glucose meter kit and supplies Dispense based on patient and insurance preference. Check glucose in the morning fasting and before meals. Use up to four times daily as directed. (FOR  ICD-10 E10.9, E11.9). 05/16/20   Flinchum, Eula Fried, FNP  empagliflozin (JARDIANCE) 10 MG TABS tablet TAKE 1 TABLET(10 MG) BY MOUTH DAILY 06/14/22   Cannady, Corrie Dandy T, NP  famotidine (PEPCID) 20 MG tablet Take 20 mg by mouth daily.    [provider]  loratadine (CLARITIN) 10 MG tablet Take 10 mg by mouth daily. Patient not taking: Reported on 10/25/2022    [provider]  Multiple Vitamin (MULTIVITAMIN WITH MINERALS) TABS tablet Take 1 tablet by mouth daily.    [provider]     Physical Exam: Vitals:   11/19/22 0608 11/19/22 0630 11/19/22 0700 11/19/22 0730  BP:  (!) 107/90 109/85 (!) 115/94  Pulse: (!) 113 (!) 102 (!) 143 (!) 113  Resp: 19 14 (!) 22 17  Temp:      TempSrc:      SpO2: 97% 94% 97% 96%  Weight:      Height:       Physical Exam Constitutional:      Appearance: He is obese.  HENT:     Head: Normocephalic and atraumatic.     Nose: Nose normal.     Mouth/Throat:     Mouth: Mucous membranes are moist.  Eyes:     Pupils: Pupils are equal, round, and reactive to light.  Cardiovascular:     Rate and Rhythm: Normal rate and regular rhythm.  Pulmonary:     Effort: Pulmonary effort is normal.  Abdominal:     General: Bowel sounds are normal.  Musculoskeletal:        General: Normal range of motion.     Comments: Trace bilateral LE edema    Neurological:     General: No focal deficit present.  Psychiatric:        Mood and Affect: Mood normal.     Data Reviewed:  There are no new results to review at this time.  DG Chest Port 1 View CLINICAL DATA:  57 year old male with history of shortness of breath.  EXAM: PORTABLE CHEST 1 VIEW  COMPARISON:  Chest x-ray 05/08/2017.  FINDINGS: Lung volumes are normal. No consolidative airspace disease. No pleural effusions. No pneumothorax. No evidence of pulmonary edema. Enlargement of the cardiopericardial silhouette, new compared to the prior study. Upper mediastinal contours are within normal limits.  IMPRESSION: New enlargement of the cardiopericardial silhouette, which could reflect cardiomegaly and/or underlying pericardial effusion. Further evaluation with echocardiography is suggested if clinically appropriate.  Electronically Signed   By: Trudie Reed M.D.   On: 11/19/2022 07:15  Lab Results  Component Value Date   WBC 11.2 (H) 11/19/2022   HGB 14.9 11/19/2022   HCT 47.7 11/19/2022   MCV 92.6 11/19/2022   PLT 270 11/19/2022   Last metabolic panel Lab Results   Component Value Date   GLUCOSE 105 (H) 11/19/2022   NA 140 11/19/2022   K 4.0 11/19/2022   CL 106 11/19/2022   CO2 22 11/19/2022   BUN 23 (H) 11/19/2022   CREATININE 1.27 (H) 11/19/2022   GFRNONAA >60 11/19/2022   CALCIUM 8.8 (L) 11/19/2022   PROT 7.2 11/19/2022   ALBUMIN 3.8 11/19/2022   LABGLOB 2.6 09/19/2022   AGRATIO 1.6 03/21/2022   BILITOT 0.7 11/19/2022   ALKPHOS 83 11/19/2022   AST 26 11/19/2022   ALT 20 11/19/2022   ANIONGAP 12 11/19/2022    Assessment and Plan: * Atrial fibrillation with rapid ventricular response (HCC) Progressive shortness of breath and increased work of breathing over the past  month with noted new onset A-fib with RVR on EKG Heart rate into the 150s Started on dill drip in the ER CHA2DS2-VASc score tentatively 1-2 with noted chest x-ray concerning for cardiomegaly and possible undiagnosed heart failure Defer anticoagulation to cardiology Pending formal cardiology evaluation 2D echo Monitor  Cardiomegaly Positive cardiomegaly on imaging with presentation of shortness of breath and new onset A-fib with RVR BMP pending 2D echo Diuresis appropriate Follow-up cardiology recommendations  OSA on CPAP CPAP   GERD without esophagitis PPI  Morbid obesity (HCC) BMI in 50s Discussed diet and lifestyle modification    Hyperlipidemia associated with type 2 diabetes mellitus (HCC) Cont lipitor  Check lipid panel    Type 2 diabetes mellitus with morbid obesity (HCC) SSI  A1C     Greater than 50% was spent in counseling and coordination, critical care of care with patient Total encounter time 80 minutes or more    Advance Care Planning:   Code Status: Full Code   Consults: Cardiology   Family Communication: No family at the bedside   Severity of Illness: The appropriate patient status for this patient is INPATIENT. Inpatient status is judged to be reasonable and necessary in order to provide the required intensity of service to  ensure the patient's safety. The patient's presenting symptoms, physical exam findings, and initial radiographic and laboratory data in the context of their chronic comorbidities is felt to place them at high risk for further clinical deterioration. Furthermore, it is not anticipated that the patient will be medically stable for discharge from the hospital within 2 midnights of admission.   * I certify that at the point of admission it is my clinical judgment that the patient will require inpatient hospital care spanning beyond 2 midnights from the point of admission due to high intensity of service, high risk for further deterioration and high frequency of surveillance required.*  Author: Floydene Flock, MD 11/19/2022 8:08 AM  For on call review [email or web address].

## 2022-11-19 NOTE — Assessment & Plan Note (Signed)
CPAP.

## 2022-11-19 NOTE — Consult Note (Signed)
Pharmacy Consult Note - Anticoagulation  Pharmacy Consult for heparin Indication: atrial fibrillation  PATIENT MEASUREMENTS: Height: 5\' 6"  (167.6 cm) Weight: (!) 149.7 kg (330 lb) IBW/kg (Calculated) : 63.8 HEPARIN DW (KG): 100.7  VITAL SIGNS: Temp: 98 F (36.7 C) (10/14 1256) Temp Source: Oral (10/14 1256) BP: 110/89 (10/14 1558) Pulse Rate: 95 (10/14 1559)  Recent Labs    11/19/22 0533 11/19/22 0734 11/19/22 1634  HGB 14.9  --   --   HCT 47.7  --   --   PLT 270  --   --   APTT 32  --   --   LABPROT 16.0*  --   --   INR 1.3*  --   --   HEPARINUNFRC  --   --  0.56  CREATININE 1.27*  --   --   TROPONINIHS 8 9  --     Estimated Creatinine Clearance: 91.3 mL/min (A) (by C-G formula based on SCr of 1.27 mg/dL (H)).  PAST MEDICAL HISTORY: Past Medical History:  Diagnosis Date   Allergy    Arthritis    Colon polyps    Diabetes mellitus, type 2 (HCC)    Fatigue 04/15/2020   GERD (gastroesophageal reflux disease)    Hydrocele, left    Hyperlipidemia 05/23/2020   Morbid obesity (HCC)    Screening for malignant neoplasm of prostate 04/15/2020   Sleep apnea    Vitamin D deficiency     ASSESSMENT: 57 y.o. male with PMH obesity, T2DM, HLD, OSA on CPAP is presenting with atrial fibrillation. Patient is not on chronic anticoagulation per chart review. HR currently uncontrolled and ECG notable for Afib with RVR as well as right ventricular hypertrophy. CHADSVASc appears to be 1 or 2. Pharmacy has been consulted to initiate and manage heparin intravenous infusion.  Pertinent medications: Enoxaparin 75 mg subQ given morning of 10/14  Goal(s) of therapy: Heparin level 0.3 - 0.7 units/mL Monitor platelets by anticoagulation protocol: Yes   Baseline anticoagulation labs: Recent Labs    11/19/22 0533  APTT 32  INR 1.3*  HGB 14.9  PLT 270    Date Time aPTT/HL Rate/Comment 1014 1634 0.56  Therapeutic x 1     PLAN: Continue heparin infusion at 1500  units/hour. Check heparin level in 6 hours, then daily once at least two levels are consecutively therapeutic. Monitor CBC daily while on heparin infusion.   Will M. Dareen Piano, PharmD Clinical Pharmacist 11/19/2022 5:03 PM

## 2022-11-19 NOTE — Assessment & Plan Note (Addendum)
Progressive shortness of breath and increased work of breathing over the past month with noted new onset A-fib with RVR on EKG Heart rate into the 150s Started on dill drip in the ER CHA2DS2-VASc score tentatively 1-2 with noted chest x-ray concerning for cardiomegaly and possible undiagnosed heart failure Defer anticoagulation to cardiology Pending formal cardiology evaluation 2D echo Monitor

## 2022-11-19 NOTE — Plan of Care (Signed)
Patient admitted to cardiac PCU overnight. Patient remains on active infusions of Heparin and Diltiazem. Tachyneic and tachycardiac but no obvious acute distress. No supplemental O2 requirement.   Problem: Education: Goal: Knowledge of disease or condition will improve Outcome: Progressing Goal: Understanding of medication regimen will improve Outcome: Progressing Goal: Individualized Educational Video(s) Outcome: Progressing   Problem: Activity: Goal: Ability to tolerate increased activity will improve Outcome: Progressing   Problem: Cardiac: Goal: Ability to achieve and maintain adequate cardiopulmonary perfusion will improve Outcome: Progressing   Problem: Health Behavior/Discharge Planning: Goal: Ability to safely manage health-related needs after discharge will improve Outcome: Progressing   Problem: Education: Goal: Knowledge of General Education information will improve Description: Including pain rating scale, medication(s)/side effects and non-pharmacologic comfort measures Outcome: Progressing   Problem: Health Behavior/Discharge Planning: Goal: Ability to manage health-related needs will improve Outcome: Progressing   Problem: Clinical Measurements: Goal: Ability to maintain clinical measurements within normal limits will improve Outcome: Progressing Goal: Will remain free from infection Outcome: Progressing Goal: Diagnostic test results will improve Outcome: Progressing Goal: Respiratory complications will improve Outcome: Progressing Goal: Cardiovascular complication will be avoided Outcome: Progressing   Problem: Activity: Goal: Risk for activity intolerance will decrease Outcome: Progressing   Problem: Nutrition: Goal: Adequate nutrition will be maintained Outcome: Progressing   Problem: Coping: Goal: Level of anxiety will decrease Outcome: Progressing   Problem: Elimination: Goal: Will not experience complications related to bowel  motility Outcome: Progressing Goal: Will not experience complications related to urinary retention Outcome: Progressing   Problem: Pain Managment: Goal: General experience of comfort will improve Outcome: Progressing   Problem: Safety: Goal: Ability to remain free from injury will improve Outcome: Progressing   Problem: Skin Integrity: Goal: Risk for impaired skin integrity will decrease Outcome: Progressing   Problem: Education: Goal: Ability to describe self-care measures that may prevent or decrease complications (Diabetes Survival Skills Education) will improve Outcome: Progressing Goal: Individualized Educational Video(s) Outcome: Progressing   Problem: Coping: Goal: Ability to adjust to condition or change in health will improve Outcome: Progressing   Problem: Fluid Volume: Goal: Ability to maintain a balanced intake and output will improve Outcome: Progressing   Problem: Health Behavior/Discharge Planning: Goal: Ability to identify and utilize available resources and services will improve Outcome: Progressing Goal: Ability to manage health-related needs will improve Outcome: Progressing   Problem: Metabolic: Goal: Ability to maintain appropriate glucose levels will improve Outcome: Progressing   Problem: Nutritional: Goal: Maintenance of adequate nutrition will improve Outcome: Progressing Goal: Progress toward achieving an optimal weight will improve Outcome: Progressing   Problem: Skin Integrity: Goal: Risk for impaired skin integrity will decrease Outcome: Progressing   Problem: Tissue Perfusion: Goal: Adequacy of tissue perfusion will improve Outcome: Progressing

## 2022-11-19 NOTE — Assessment & Plan Note (Signed)
Cont lipitor  Check lipid panel

## 2022-11-19 NOTE — ED Notes (Signed)
Assumed care of pt at this time. Pt is AAXO4, placed on CCM, heart rhythm noted to be a-fib w/ RVR. Pt denies CP or hx of A-fib. Pt endorses some mild SHOB. Provider Ward made aware. Call light within reach, side rails up x2. No needs identified at this time.

## 2022-11-19 NOTE — Assessment & Plan Note (Signed)
SSI  A1C

## 2022-11-19 NOTE — Progress Notes (Signed)
   11/19/22 1954  Assess: MEWS Score  Temp 98.6 F (37 C)  BP 126/83  MAP (mmHg) 97  Pulse Rate (!) 110  ECG Heart Rate (!) 115  Resp 20  SpO2 91 %  O2 Device Room Air  Assess: MEWS Score  MEWS Temp 0  MEWS Systolic 0  MEWS Pulse 2  MEWS RR 0  MEWS LOC 0  MEWS Score 2  MEWS Score Color Yellow  Assess: if the MEWS score is Yellow or Red  Were vital signs accurate and taken at a resting state? Yes  Does the patient meet 2 or more of the SIRS criteria? No  MEWS guidelines implemented  Yes, red  Treat  MEWS Interventions Considered administering scheduled or prn medications/treatments as ordered  Take Vital Signs  Increase Vital Sign Frequency  Red: Q1hr x2, continue Q4hrs until patient remains green for 12hrs  Escalate  MEWS: Escalate Red: Discuss with charge nurse and notify provider. Consider notifying RRT. If remains red for 2 hours consider need for higher level of care  Notify: Charge Nurse/RN  Name of Charge Nurse/RN Notified Peggye Form, RN  Provider Notification  Provider Name/Title Dr. Lindajo Royal  Date Provider Notified 11/19/22  Time Provider Notified 1957  Method of Notification Page (secure chat)  Notification Reason Other (Comment) (MEW)  Provider response No new orders  Date of Provider Response 11/19/22  Time of Provider Response 1958  Assess: SIRS CRITERIA  SIRS Temperature  0  SIRS Pulse 1  SIRS Respirations  0  SIRS WBC 0  SIRS Score Sum  1

## 2022-11-20 ENCOUNTER — Other Ambulatory Visit (HOSPITAL_COMMUNITY): Payer: Self-pay

## 2022-11-20 DIAGNOSIS — I5021 Acute systolic (congestive) heart failure: Secondary | ICD-10-CM | POA: Diagnosis not present

## 2022-11-20 DIAGNOSIS — I4891 Unspecified atrial fibrillation: Secondary | ICD-10-CM | POA: Diagnosis not present

## 2022-11-20 LAB — CBC
HCT: 42 % (ref 39.0–52.0)
Hemoglobin: 13.7 g/dL (ref 13.0–17.0)
MCH: 29.2 pg (ref 26.0–34.0)
MCHC: 32.6 g/dL (ref 30.0–36.0)
MCV: 89.6 fL (ref 80.0–100.0)
Platelets: 237 10*3/uL (ref 150–400)
RBC: 4.69 MIL/uL (ref 4.22–5.81)
RDW: 14.1 % (ref 11.5–15.5)
WBC: 11.7 10*3/uL — ABNORMAL HIGH (ref 4.0–10.5)
nRBC: 0 % (ref 0.0–0.2)

## 2022-11-20 LAB — ECHOCARDIOGRAM COMPLETE
AR max vel: 2.11 cm2
AV Area VTI: 2.07 cm2
AV Area mean vel: 2.05 cm2
AV Mean grad: 4.2 mm[Hg]
AV Peak grad: 7.3 mm[Hg]
Ao pk vel: 1.35 m/s
Height: 66 in
S' Lateral: 4.7 cm
Weight: 5280 [oz_av]

## 2022-11-20 LAB — COMPREHENSIVE METABOLIC PANEL
ALT: 19 U/L (ref 0–44)
AST: 21 U/L (ref 15–41)
Albumin: 3.8 g/dL (ref 3.5–5.0)
Alkaline Phosphatase: 75 U/L (ref 38–126)
Anion gap: 11 (ref 5–15)
BUN: 18 mg/dL (ref 6–20)
CO2: 22 mmol/L (ref 22–32)
Calcium: 8.8 mg/dL — ABNORMAL LOW (ref 8.9–10.3)
Chloride: 103 mmol/L (ref 98–111)
Creatinine, Ser: 0.91 mg/dL (ref 0.61–1.24)
GFR, Estimated: >60 mL/min (ref >60)
Glucose, Bld: 106 mg/dL — ABNORMAL HIGH (ref 70–99)
Potassium: 3.7 mmol/L (ref 3.5–5.1)
Sodium: 136 mmol/L (ref 135–145)
Total Bilirubin: 1.8 mg/dL — ABNORMAL HIGH (ref 0.3–1.2)
Total Protein: 7.1 g/dL (ref 6.5–8.1)

## 2022-11-20 LAB — HEMOGLOBIN A1C
Hgb A1c MFr Bld: 6.2 % — ABNORMAL HIGH (ref 4.8–5.6)
Mean Plasma Glucose: 131.24 mg/dL

## 2022-11-20 LAB — GLUCOSE, CAPILLARY
Glucose-Capillary: 92 mg/dL (ref 70–99)
Glucose-Capillary: 108 mg/dL — ABNORMAL HIGH (ref 70–99)
Glucose-Capillary: 90 mg/dL (ref 70–99)
Glucose-Capillary: 109 mg/dL — ABNORMAL HIGH (ref 70–99)

## 2022-11-20 LAB — HEPARIN LEVEL (UNFRACTIONATED)
Heparin Unfractionated: 0.35 [IU]/mL (ref 0.30–0.70)
Heparin Unfractionated: 0.42 [IU]/mL (ref 0.30–0.70)

## 2022-11-20 MED ORDER — FUROSEMIDE 10 MG/ML IJ SOLN
20.0000 mg | Freq: Two times a day (BID) | INTRAMUSCULAR | Status: DC
  Administered 2022-11-20 – 2022-11-21 (×3): 20 mg via INTRAVENOUS
  Filled 2022-11-20 (×3): qty 2

## 2022-11-20 MED ORDER — ORAL CARE MOUTH RINSE: 15.0000 mL | OROMUCOSAL | Status: DC | PRN

## 2022-11-20 MED ORDER — METOPROLOL TARTRATE 25 MG PO TABS
12.5000 mg | ORAL_TABLET | Freq: Four times a day (QID) | ORAL | Status: DC
  Administered 2022-11-20 – 2022-11-30 (×38): 12.5 mg via ORAL
  Filled 2022-11-20 (×38): qty 1

## 2022-11-20 MED ORDER — POTASSIUM CHLORIDE CRYS ER 20 MEQ PO TBCR
40.0000 meq | EXTENDED_RELEASE_TABLET | ORAL | Status: AC
  Administered 2022-11-20 (×2): 40 meq via ORAL
  Filled 2022-11-20 (×2): qty 2

## 2022-11-20 NOTE — Progress Notes (Addendum)
Progress Note   [name] WUJ:[id number] DOB: [birth year] DOA: 11/19/2022     1 DOS: the patient was seen and examined on 11/20/2022    Subjective:  Patient seen and examined at bedside this morning Denies nausea vomiting abdominal pain chest pain or cough   Brief hospital course: From HPI "Casey Velez is a 57 y.o. male with medical history significant of morbid obesity, type 2 diabetes, hyperlipidemia presenting with A-fib with RVR.  Patient reports roughly 1 month of worsening shortness of breath or increased work of breathing.  Patient works in a [employer].  Initially started roughly 4 weeks ago when patient was doing multiple oil changes.  Shortness of breath fairly constant though most predominant with exertion.  Positive mild palpitations.  No chest pain.  No nausea or vomiting.  Denies any known history of atrial fibrillation, heart failure in the past.  Baseline type 2 diabetes-fairly well-controlled.  Not physically active.  No reported alcohol or tobacco use.  Does drink sweet tea on a regular basis.  Denies any excessive caffeine intake apart from sweet tea.  No fevers or chills. Presented to the ER afebrile, heart rate in the 150s to 160s, BP stable.  Satting well on room air.  EKG with A-fib with RVR.  White count of 1.2, hemoglobin 14.9, platelets 270, creatinine 1.27, glucose 105.  Troponin negative x 1.  Chest x-ray with enlarged cardiac silhouette versus cardiomegaly.  "  Assessment and Plan:  Atrial fibrillation with rapid ventricular response (HCC) Progressive shortness of breath and increased work of breathing over the past month with noted new onset A-fib with RVR on EKG Scented with heart rates of 150s CHA2DS2-VASc of 2 (diabetes and CHF) Echocardiogram showing EF 30 to 35% Plan of care discussed with cardiologist who is planning on TEE/DCCV hopefully on Thursday We will continue heparin drip  Acute systolic congestive heart failure Echo  showed EF of 30 to 35%  Continue metoprolol as recommended by cardiology Continue IV Lasix Monitor input and output We will introduce other GDMT as blood pressure allows according to cardiologist recs  OSA on CPAP CPAP   GERD without esophagitis Continue PPI therapy   Morbid obesity (HCC) BMI in 50s Unseld on weight loss with dieting lifestyle modification     Hyperlipidemia associated with type 2 diabetes mellitus (HCC) Continue statin therapy     Type 2 diabetes mellitus with morbid obesity (HCC) Continue insulin therapy Monitor glucose level closely      Advance Care Planning:   Code Status: Full Code    Consults: Cardiology     Physical Exam: Constitutional: Middle-aged male sitting up in a chair in no acute distress HENT:     Head: Normocephalic and atraumatic.     Nose: Nose normal.     Mouth/Throat:     Mouth: Mucous membranes are moist.  Eyes:     Pupils: Pupils are equal, round, and reactive to light.  Cardiovascular:     Rate and Rhythm: Normal rate and regular rhythm.  Pulmonary:     Effort: Pulmonary effort is normal.  Abdominal:     General: Bowel sounds are normal.  Musculoskeletal:        General: Normal range of motion.     Comments: Trace bilateral LE edema    Neurological:     General: No focal deficit present.  Psychiatric:        Mood and Affect: Mood normal.  Vitals:   11/20/22 9562  11/20/22 1111 11/20/22 1316 11/20/22 1513  BP: 110/84 124/82 (!) 129/94 (!) 106/93  Pulse: 99 (!) 105 100 (!) 107  Resp: (!) 23 (!) 24 (!) 21 18  Temp: 98.2 F (36.8 C) 98.2 F (36.8 C) 97.8 F (36.6 C) 98.5 F (36.9 C)  TempSrc:      SpO2: 93% 96% 95% 91%  Weight:      Height:        Data Reviewed: I have reviewed admitting provider documentation, cardiology documentation, nursing documentation, below mentioned labs including CBC and CMP, I have also reviewed patient's chest x-ray showing cardiomegaly.  EKG reviewed showing findings of A-fib with  RVR     Latest Ref Rng & Units 11/20/2022    5:27 AM 11/19/2022    5:33 AM 03/21/2022    9:16 AM  CBC  WBC 4.0 - 10.5 K/uL 11.7  11.2  9.0   Hemoglobin 13.0 - 17.0 g/dL 14.7  82.9  56.2   Hematocrit 39.0 - 52.0 % 42.0  47.7  46.4   Platelets 150 - 400 K/uL 237  270  274        Latest Ref Rng & Units 11/20/2022    5:27 AM 11/19/2022    5:33 AM 09/19/2022    8:23 AM  CMP  Glucose 70 - 99 mg/dL 130  865  784   BUN 6 - 20 mg/dL 18  23  12    Creatinine 0.61 - 1.24 mg/dL 6.96  2.95  2.84   Sodium 135 - 145 mmol/L 136  140  141   Potassium 3.5 - 5.1 mmol/L 3.7  4.0  5.0   Chloride 98 - 111 mmol/L 103  106  103   CO2 22 - 32 mmol/L 22  22  23    Calcium 8.9 - 10.3 mg/dL 8.8  8.8  9.3   Total Protein 6.5 - 8.1 g/dL 7.1  7.2  6.7   Total Bilirubin 0.3 - 1.2 mg/dL 1.8  0.7  0.7   Alkaline Phos 38 - 126 U/L 75  83  134   AST 15 - 41 U/L 21  26  38   ALT 0 - 44 U/L 19  20  50     Family Communication: Discussed with patient's family present at bedside  Disposition: Status is: Inpatient   Planned Discharge Destination: Home after cardiology clearance    Time spent: 58 minutes  Author: Loyce Dys, MD 11/20/2022 4:38 PM  For on call review [email or web address].

## 2022-11-20 NOTE — Progress Notes (Signed)
Rounding Note    Patient Name: Casey Velez Date of Encounter: 11/20/2022  [facility] Cardiologist: New  Subjective   Patient remains in afib with mostly controlled rates. He is overall feeling better. He denies chest pain or shortness of breath.  Inpatient Medications    Scheduled Meds:  furosemide  20 mg Intravenous BID   insulin aspart  0-9 Units Subcutaneous TID WC   metoprolol tartrate  12.5 mg Oral Q6H   potassium chloride  40 mEq Oral Q4H   Continuous Infusions:  heparin 1,500 Units/hr (11/20/22 0700)   PRN Meds: ondansetron **OR** ondansetron (ZOFRAN) IV, mouth rinse   Vital Signs    Vitals:   11/20/22 0500 11/20/22 0600 11/20/22 0645 11/20/22 0806  BP:    110/84  Pulse:    99  Resp: (!) 23 (!) 35 (!) 22 (!) 23  Temp:    98.2 F (36.8 C)  TempSrc:      SpO2:    93%  Weight:      Height:        Intake/Output Summary (Last 24 hours) at 11/20/2022 0835 Last data filed at 11/20/2022 0700 Gross per 24 hour  Intake 626.44 ml  Output 480 ml  Net 146.44 ml      11/19/2022    5:28 AM 10/25/2022    8:40 AM 09/19/2022    8:12 AM  Last 3 Weights  Weight (lbs) 330 lb 325 lb 3.2 oz 324 lb 9.6 oz  Weight (kg) 149.687 kg 147.51 kg 147.238 kg      Telemetry    Afib, 80s up to 110s - Personally Reviewed  ECG    No new - Personally Reviewed  Physical Exam   GEN: No acute distress.   Neck: + JVD Cardiac: Irreg Irreg, no murmurs, rubs, or gallops.  Respiratory: Crackles at bases bilaterally. GI: Soft, nontender, non-distended  MS: No edema; No deformity. Neuro:  Nonfocal  Psych: Normal affect   Labs    High Sensitivity Troponin:   Recent Labs  Lab 11/19/22 0533 11/19/22 0734  TROPONINIHS 8 9     Chemistry Recent Labs  Lab 11/19/22 0533 11/20/22 0527  NA 140 136  K 4.0 3.7  CL 106 103  CO2 22 22  GLUCOSE 105* 106*  BUN 23* 18  CREATININE 1.27* 0.91  CALCIUM 8.8* 8.8*  MG 1.9  --   PROT 7.2 7.1  ALBUMIN 3.8 3.8   AST 26 21  ALT 20 19  ALKPHOS 83 75  BILITOT 0.7 1.8*  GFRNONAA >60 >60  ANIONGAP 12 11    Lipids  Recent Labs  Lab 11/19/22 0805  CHOL 56  TRIG 25  HDL 18*  LDLCALC 33  CHOLHDL 3.1    Hematology Recent Labs  Lab 11/19/22 0533 11/20/22 0527  WBC 11.2* 11.7*  RBC 5.15 4.69  HGB 14.9 13.7  HCT 47.7 42.0  MCV 92.6 89.6  MCH 28.9 29.2  MCHC 31.2 32.6  RDW 13.9 14.1  PLT 270 237   Thyroid  Recent Labs  Lab 11/19/22 0638  TSH 2.290    BNP Recent Labs  Lab 11/19/22 0533  BNP 334.5*    DDimer No results for input(s): "DDIMER" in the last 168 hours.   Radiology    ECHOCARDIOGRAM COMPLETE  Result Date: 11/20/2022    ECHOCARDIOGRAM REPORT   Patient Name:   Casey Velez Date of Exam: 11/19/2022 Medical Rec #:  [id number]      Height:  66.0 in Accession #:    [id number]     Weight:       330.0 lb Date of Birth:  12-02-1965     BSA:          2.474 m Patient Age:    57 years       BP:           119/97 mmHg Patient Gender: M              HR:           106 bpm. Exam Location:  [facility] Procedure: 2D Echo, Cardiac Doppler, Color Doppler and Intracardiac            Opacification Agent Indications:     I48.91 Atrial Fibrillation  History:         Patient has no prior history of Echocardiogram examinations.                  Signs/Symptoms:Fatigue; Risk Factors:Dyslipidemia, Sleep Apnea                  and Diabetes.  Sonographer:     Daphine Deutscher RDCS Referring Phys:  [id number] Francoise Schaumann [place]ON Diagnosing Phys: Yvonne Kendall MD IMPRESSIONS  1. Left ventricular ejection fraction, by estimation, is 30 to 35%. The left ventricle has moderate to severely decreased function. The left ventricle demonstrates global hypokinesis. The left ventricular internal cavity size was moderately dilated. There is mild left ventricular hypertrophy. Left ventricular diastolic function could not be evaluated.  2. Right ventricular systolic function is moderately reduced. The right ventricular size  is mildly enlarged. There is moderately elevated pulmonary artery systolic pressure.  3. Left atrial size was mildly dilated.  4. A small pericardial effusion is present.  5. The mitral valve is normal in structure. Mild mitral valve regurgitation.  6. Tricuspid valve regurgitation is mild to moderate.  7. The aortic valve is tricuspid. Aortic valve regurgitation is not visualized. No aortic stenosis is present.  8. There is mild dilatation of the ascending aorta, measuring 42 mm.  9. The inferior vena cava is dilated in size with <50% respiratory variability, suggesting right atrial pressure of 15 mmHg. FINDINGS  Left Ventricle: Left ventricular ejection fraction, by estimation, is 30 to 35%. The left ventricle has moderate to severely decreased function. The left ventricle demonstrates global hypokinesis. Definity contrast agent was given IV to delineate the left ventricular endocardial borders. The left ventricular internal cavity size was moderately dilated. There is mild left ventricular hypertrophy. Left ventricular diastolic function could not be evaluated due to atrial fibrillation. Left ventricular diastolic function could not be evaluated. Right Ventricle: The right ventricular size is mildly enlarged. No increase in right ventricular wall thickness. Right ventricular systolic function is moderately reduced. There is moderately elevated pulmonary artery systolic pressure. The tricuspid regurgitant velocity is 2.94 m/s, and with an assumed right atrial pressure of 15 mmHg, the estimated right ventricular systolic pressure is 49.6 mmHg. Left Atrium: Left atrial size was mildly dilated. Right Atrium: Right atrial size was normal in size. Pericardium: A small pericardial effusion is present. Mitral Valve: The mitral valve is normal in structure. Mild mitral valve regurgitation. Tricuspid Valve: The tricuspid valve is not well visualized. Tricuspid valve regurgitation is mild to moderate. Aortic Valve: The  aortic valve is tricuspid. Aortic valve regurgitation is not visualized. No aortic stenosis is present. Aortic valve mean gradient measures 4.2 mmHg. Aortic valve peak gradient measures 7.3 mmHg. Aortic valve area,  by VTI measures 2.07 cm. Pulmonic Valve: The pulmonic valve was not well visualized. Pulmonic valve regurgitation is trivial. No evidence of pulmonic stenosis. Aorta: The aortic root is normal in size and structure. There is mild dilatation of the ascending aorta, measuring 42 mm. Pulmonary Artery: The pulmonary artery is not well seen. Venous: The inferior vena cava is dilated in size with less than 50% respiratory variability, suggesting right atrial pressure of 15 mmHg. IAS/Shunts: No atrial level shunt detected by color flow Doppler.  LEFT VENTRICLE PLAX 2D LVIDd:         6.20 cm LVIDs:         4.70 cm LV PW:         1.10 cm LV IVS:        1.04 cm LVOT diam:     2.10 cm LV SV:         38 LV SV Index:   16 LVOT Area:     3.46 cm  RIGHT VENTRICLE            IVC RV Basal diam:  4.70 cm    IVC diam: 2.70 cm RV S prime:     7.51 cm/s TAPSE (M-mode): 1.6 cm LEFT ATRIUM             Index        RIGHT ATRIUM           Index LA diam:        5.50 cm 2.22 cm/m   RA Area:     15.00 cm LA Vol (A2C):   67.2 ml 27.16 ml/m  RA Volume:   41.50 ml  16.77 ml/m LA Vol (A4C):   84.7 ml 34.23 ml/m LA Biplane Vol: 75.5 ml 30.52 ml/m  AORTIC VALVE AV Area (Vmax):    2.11 cm AV Area (Vmean):   2.05 cm AV Area (VTI):     2.07 cm AV Vmax:           134.75 cm/s AV Vmean:          95.840 cm/s AV VTI:            0.185 m AV Peak Grad:      7.3 mmHg AV Mean Grad:      4.2 mmHg LVOT Vmax:         82.23 cm/s LVOT Vmean:        56.667 cm/s LVOT VTI:          0.111 m LVOT/AV VTI ratio: 0.60  AORTA Ao Root diam: 3.60 cm Ao Asc diam:  4.20 cm MV E velocity: 92.98 cm/s  TRICUSPID VALVE                            TR Peak grad:   34.6 mmHg                            TR Vmax:        294.00 cm/s                             SHUNTS                             Systemic VTI:  0.11 m  Systemic Diam: 2.10 cm Yvonne Kendall MD Electronically signed by Yvonne Kendall MD Signature Date/Time: 11/20/2022/6:55:25 AM    Final    DG Chest Port 1 View  Result Date: 11/19/2022 CLINICAL DATA:  57 year old male with history of shortness of breath. EXAM: PORTABLE CHEST 1 VIEW COMPARISON:  Chest x-ray 05/08/2017. FINDINGS: Lung volumes are normal. No consolidative airspace disease. No pleural effusions. No pneumothorax. No evidence of pulmonary edema. Enlargement of the cardiopericardial silhouette, new compared to the prior study. Upper mediastinal contours are within normal limits. IMPRESSION: New enlargement of the cardiopericardial silhouette, which could reflect cardiomegaly and/or underlying pericardial effusion. Further evaluation with echocardiography is suggested if clinically appropriate. Electronically Signed   By: Trudie Reed M.D.   On: 11/19/2022 07:15    Cardiac Studies   Echo    1. Left ventricular ejection fraction, by estimation, is 30 to 35%. The  left ventricle has moderate to severely decreased function. The left  ventricle demonstrates global hypokinesis. The left ventricular internal  cavity size was moderately dilated.  There is mild left ventricular hypertrophy. Left ventricular diastolic  function could not be evaluated.   2. Right ventricular systolic function is moderately reduced. The right  ventricular size is mildly enlarged. There is moderately elevated  pulmonary artery systolic pressure.   3. Left atrial size was mildly dilated.   4. A small pericardial effusion is present.   5. The mitral valve is normal in structure. Mild mitral valve  regurgitation.   6. Tricuspid valve regurgitation is mild to moderate.   7. The aortic valve is tricuspid. Aortic valve regurgitation is not  visualized. No aortic stenosis is present.   8. There is mild dilatation of the ascending aorta,  measuring 42 mm.   9. The inferior vena cava is dilated in size with <50% respiratory  variability, suggesting right atrial pressure of 15 mmHg.     Patient Profile     57 y.o. male with a hx of obesity, DM2, HLD, OSA on CPAP who is being seen 11/19/2022 for the evaluation of new onset afib   Assessment & Plan    New onset Afib with RVR - patient presented with 1 month of exertional SOB found to be in rapid afib started on IV dilt with improvement of HR - echo showed reduced EF>iv dilt held and started on BB - unclear chronicity of afib - CHADSVASC at least 2 (DM2, CHF) continue with IV heparin. Will require long-term a/c - Keep Mag>2 and K>4 - TSH wnl - patient is still in Afib with mostly controlled rates - continue metoprolol 12.6 mg Q6hours - he will need TEE/DCCV once volume status has been optimized    Acute systolic heart failure - echo showed reduced LVEF 30-35%, global HK, mild LVH, moderately reduced RVSF, mild MR, small pericardial effusion, mild MR - BNP 334 - difficult to assess volume status given body habitus, but suspect he is volume overload - IV lasix 20mg  BID - check daily weights, strict I/Os and kidney function  HLD - LDL 33 -PTA Lipitor 80mg  daily   DM2 -  A1C 6.2 - per IM   OSA - he reports compliance with CPAP  For questions or updates, please contact [facility] Please consult [email or web address] for contact info under        Signed, Thadeus Gandolfi David Stall, PA-C  11/20/2022, 8:35 AM

## 2022-11-20 NOTE — Consult Note (Signed)
Pharmacy Consult Note - Anticoagulation  Pharmacy Consult for heparin Indication: atrial fibrillation  PATIENT MEASUREMENTS: Height: 5\' 6"  (167.6 cm) Weight: (!) 149.7 kg (330 lb) IBW/kg (Calculated) : 63.8 HEPARIN DW (KG): 100.7  VITAL SIGNS: Temp: 97.5 F (36.4 C) (10/15 0400) Temp Source: Axillary (10/15 0400) BP: 122/94 (10/15 0400) Pulse Rate: 71 (10/15 0400)  Recent Labs    11/19/22 0533 11/19/22 0734 11/19/22 1634 11/20/22 0527  HGB 14.9  --   --  13.7  HCT 47.7  --   --  42.0  PLT 270  --   --  237  APTT 32  --   --   --   LABPROT 16.0*  --   --   --   INR 1.3*  --   --   --   HEPARINUNFRC  --   --    < > 0.35  CREATININE 1.27*  --   --  0.91  TROPONINIHS 8 9  --   --    < > = values in this interval not displayed.    Estimated Creatinine Clearance: 127.4 mL/min (by C-G formula based on SCr of 0.91 mg/dL).  PAST MEDICAL HISTORY: Past Medical History:  Diagnosis Date   Allergy    Arthritis    Colon polyps    Diabetes mellitus, type 2 (HCC)    Fatigue 04/15/2020   GERD (gastroesophageal reflux disease)    Hydrocele, left    Hyperlipidemia 05/23/2020   Morbid obesity (HCC)    Screening for malignant neoplasm of prostate 04/15/2020   Sleep apnea    Vitamin D deficiency     ASSESSMENT: 56 y.o. male with PMH obesity, T2DM, HLD, OSA on CPAP is presenting with atrial fibrillation. Patient is not on chronic anticoagulation per chart review. HR currently uncontrolled and ECG notable for Afib with RVR as well as right ventricular hypertrophy. CHADSVASc appears to be 1 or 2. Pharmacy has been consulted to initiate and manage heparin intravenous infusion.  Pertinent medications: Enoxaparin 75 mg subQ given morning of 10/14  Goal(s) of therapy: Heparin level 0.3 - 0.7 units/mL Monitor platelets by anticoagulation protocol: Yes   Baseline anticoagulation labs: Recent Labs    11/19/22 0533 11/20/22 0527  APTT 32  --   INR 1.3*  --   HGB 14.9 13.7  PLT  270 237    Date Time aPTT/HL Rate/Comment 1014 1634 0.56  Therapeutic x 1 1014 2356 0.42  Therapeutic x 2 1015 0527 0.35  Therapeutic x 3     PLAN: Continue heparin infusion at 1500 units/hour. Recheck HL daily w/ AM labs while therapeutic Monitor CBC daily while on heparin infusion.  Otelia Sergeant, PharmD, [facility] 11/20/2022 6:11 AM

## 2022-11-20 NOTE — Progress Notes (Signed)
MEWS Progress Note  Patient Details Name: Casey Velez MRN: [id number] DOB: [birth year] Today's Date: 11/20/2022   MEWS Flowsheet Documentation:  Assess: MEWS Score Temp: 98.2 F (36.8 C) BP: 124/82 MAP (mmHg): 96 Pulse Rate: (!) 105 ECG Heart Rate: 85 Resp: (!) 24 Level of Consciousness: Alert SpO2: 96 % O2 Device: Room Air O2 Flow Rate (L/min): (S) 5 L/min FiO2 (%): (S) 40 % Assess: MEWS Score MEWS Temp: 0 MEWS Systolic: 0 MEWS Pulse: 1 MEWS RR: 1 MEWS LOC: 0 MEWS Score: 2 MEWS Score Color: Yellow Assess: SIRS CRITERIA SIRS Temperature : 0 SIRS Respirations : 1 SIRS Pulse: 1 SIRS WBC: 0 SIRS Score Sum : 2 SIRS Temperature : 0 SIRS Pulse: 1 SIRS Respirations : 1 SIRS WBC: 0 SIRS Score Sum : 2 Assess: if the MEWS score is Yellow or Red Were vital signs accurate and taken at a resting state?: Yes Does the patient meet 2 or more of the SIRS criteria?: Yes Does the patient have a confirmed or suspected source of infection?: No MEWS guidelines implemented : Yes, yellow Treat MEWS Interventions: Considered administering scheduled or prn medications/treatments as ordered Take Vital Signs Increase Vital Sign Frequency : Yellow: Q2hr x1, continue Q4hrs until patient remains green for 12hrs Escalate MEWS: Escalate: Yellow: Discuss with charge nurse and consider notifying provider and/or RRT        Laqueta Due 11/20/2022, 1:21 PM

## 2022-11-20 NOTE — TOC Benefit Eligibility Note (Signed)
Patient Product/process development scientist completed.    The patient is insured through [facility]. Patient has [employer], may use a copay card, and/or apply for patient assistance if available.    Ran test claim for Eliquis 5 mg and the current 30 day co-pay is $50.00.  Ran test claim for Xarelto 20 mg and the current 30 day co-pay is $50.00.  Ran test claim for Jardiance 10 mg and the current 30 day co-pay is $50.00.  Ran test claim for Entresto 24-26 mg and Requires Prior Authorization  Ran test claim for Farxiga 10 mg and Not Covered  This test claim was processed through [employer]- copay amounts may vary at other pharmacies due to [employer], or as the patient moves through the different stages of their insurance plan.     Roland Earl, CPHT Pharmacy Technician III Certified Patient Advocate [facility] Pharmacy Patient Advocate Team Direct Number: [phone]  Fax: [phone]

## 2022-11-20 NOTE — Progress Notes (Signed)
[facility] HF Stewardship  PCP: Marjie Skiff, NP  PCP-Cardiologist: None  HPI: Casey Velez is a 57 y.o. male with morbid obesity, type 2 diabetes (A1c 6.2), hyperlipidemia presenting with A-fib with RVR  who presented with worsening shortness of breath over 1 month. Troponin was negative on admission. BNP elevated to 334.5. CXR showed enlarged cardiac silhouette. TTE on 11/19/22 shoed LVEF of 30-35%, moderately reduced RV, small pericardial effusion, mild MR, mild-moderate TR,    Pertinent cardiac history: No significant cardiac history prior to admission.  Pertinent Lab Values: Creatinine, Ser  Date Value Ref Range Status  11/20/2022 0.91 0.61 - 1.24 mg/dL Final   BUN  Date Value Ref Range Status  11/20/2022 18 6 - 20 mg/dL Final  16/11/9602 12 6 - 24 mg/dL Final   Potassium  Date Value Ref Range Status  11/20/2022 3.7 3.5 - 5.1 mmol/L Final   Sodium  Date Value Ref Range Status  11/20/2022 136 135 - 145 mmol/L Final  09/19/2022 141 134 - 144 mmol/L Final   B Natriuretic Peptide  Date Value Ref Range Status  11/19/2022 334.5 (H) 0.0 - 100.0 pg/mL Final    Comment:    Performed at [facility], [address] [place], [place] [address]   Magnesium  Date Value Ref Range Status  11/19/2022 1.9 1.7 - 2.4 mg/dL Final    Comment:    Performed at [facility], [address] [place], [place] [address]   HB A1C ([facility])  Date Value Ref Range Status  09/19/2022 6.4 (H) 4.8 - 5.6 % Final    Comment:             Prediabetes: 5.7 - 6.4          Diabetes: >6.4          Glycemic control for adults with diabetes: <7.0    Hgb A1c MFr Bld  Date Value Ref Range Status  11/19/2022 6.2 (H) 4.8 - 5.6 % Final    Comment:    (NOTE) Pre diabetes:          5.7%-6.4%  Diabetes:              >6.4%  Glycemic control for   <7.0% adults with diabetes    TSH  Date Value Ref Range Status  11/19/2022 2.290 0.350 - 4.500 uIU/mL Final    Comment:     Performed by a 3rd Generation assay with a functional sensitivity of <=0.01 uIU/mL. Performed at [facility], [address] [place], [place] [address]   03/21/2022 2.410 0.450 - 4.500 uIU/mL Final    Vital Signs: Temp:  [97.5 F (36.4 C)-98.8 F (37.1 C)] 98.2 F (36.8 C) (10/15 0806) Pulse Rate:  [54-120] 99 (10/15 0806) Cardiac Rhythm: Atrial fibrillation;Other (Comment) (10/14 2103) Resp:  [15-36] 23 (10/15 0806) BP: (108-129)/(81-97) 110/84 (10/15 0806) SpO2:  [91 %-96 %] 93 % (10/15 0806) FiO2 (%):  [40 %] 40 % (10/14 2300)   Intake/Output Summary (Last 24 hours) at 11/20/2022 0930 Last data filed at 11/20/2022 0700 Gross per 24 hour  Intake 626.44 ml  Output 480 ml  Net 146.44 ml    Current Inpatient Medications:  -None  Prior to admission HF Medications:  -Jardiance 10 mg daily  Assessment: 1. Acute heart failure (LVEF 30-35%), due to unknown etiology. NYHA class II-III symptoms.  -Symptoms: Shortness of breath present on exertion. Some LEE present, though difficult to assess given body habitus. -Volume:  Hypervolemic on exam. Would likely benefit from furosemide. Is Lasix naive, so agree with 20 mg BID. -Hemodynamics: BP stable in 120s/90s and HR is elevated in 110s. Remains in AF. -BB: Agree with starting metoprolol tartrate 12.5 mg every 6 hours for rate control and stopping diltiazem infusion given new reduced EF. Can consolidate to metoprolol succinate prior to discharge. -ACEI/ARB/ARNI: None at this time, can consider prior to discharge. -MRA: Potassium is low. Would benefit from spironolactone to maintain normokalemia as well as for HFrEF.  -SGLT2i: Taking Jardiance for T2DM prior to admission. Continue given cardiovascular benefits.  -Currently on diltiazem infusion, which is not ideal for patients with HFrEF. Can attempt to transition to metoprolol for rate control.  Plan: 1) Medication changes recommended at this time: -Can consider  starting spironolactone 12.5 mg daily for HFrEF and hypokalemia 10/15 if BP is stable on metoprolol 12.5 mg every 6 hours  2) Patient assistance: -Entresto requires Prior Authorization, Eliquis copay is $50.00, Xarelto copay is $50.00, [place] Not covered, Jardiance copay is $50.00  -Eligible for copay cards  3) Education: - Patient has been educated on current HF medications and potential additions to HF medication regimen - Patient verbalizes understanding that over the next few months, these medication doses may change and more medications may be added to optimize HF regimen - Patient has been educated on basic disease state pathophysiology and goals of therapy  Medication Assistance / Insurance Benefits Check:  Does the patient have prescription insurance?    Type of insurance plan:   Does the patient qualify for medication assistance through manufacturers or grants? No   Outpatient Pharmacy:  Prior to admission outpatient pharmacy: [facility]   Is the patient willing to utilize a [facility] pharmacy at discharge?: Yes  Please do not hesitate to reach out with questions or concerns,  Enos Fling, PharmD, CPP, BCPS Heart Failure Pharmacist  Phone - [phone] 11/20/2022 9:32 AM

## 2022-11-20 NOTE — Consult Note (Signed)
Pharmacy Consult Note - Anticoagulation  Pharmacy Consult for heparin Indication: atrial fibrillation  PATIENT MEASUREMENTS: Height: 5\' 6"  (167.6 cm) Weight: (!) 149.7 kg (330 lb) IBW/kg (Calculated) : 63.8 HEPARIN DW (KG): 100.7  VITAL SIGNS: Temp: 98.6 F (37 C) (10/14 2343) Temp Source: Oral (10/14 2230) BP: 113/96 (10/14 2343) Pulse Rate: 59 (10/14 2343)  Recent Labs    11/19/22 0533 11/19/22 0734 11/19/22 1634 11/19/22 2356  HGB 14.9  --   --   --   HCT 47.7  --   --   --   PLT 270  --   --   --   APTT 32  --   --   --   LABPROT 16.0*  --   --   --   INR 1.3*  --   --   --   HEPARINUNFRC  --   --    < > 0.42  CREATININE 1.27*  --   --   --   TROPONINIHS 8 9  --   --    < > = values in this interval not displayed.    Estimated Creatinine Clearance: 91.3 mL/min (A) (by C-G formula based on SCr of 1.27 mg/dL (H)).  PAST MEDICAL HISTORY: Past Medical History:  Diagnosis Date   Allergy    Arthritis    Colon polyps    Diabetes mellitus, type 2 (HCC)    Fatigue 04/15/2020   GERD (gastroesophageal reflux disease)    Hydrocele, left    Hyperlipidemia 05/23/2020   Morbid obesity (HCC)    Screening for malignant neoplasm of prostate 04/15/2020   Sleep apnea    Vitamin D deficiency     ASSESSMENT: 57 y.o. male with PMH obesity, T2DM, HLD, OSA on CPAP is presenting with atrial fibrillation. Patient is not on chronic anticoagulation per chart review. HR currently uncontrolled and ECG notable for Afib with RVR as well as right ventricular hypertrophy. CHADSVASc appears to be 1 or 2. Pharmacy has been consulted to initiate and manage heparin intravenous infusion.  Pertinent medications: Enoxaparin 75 mg subQ given morning of 10/14  Goal(s) of therapy: Heparin level 0.3 - 0.7 units/mL Monitor platelets by anticoagulation protocol: Yes   Baseline anticoagulation labs: Recent Labs    11/19/22 0533  APTT 32  INR 1.3*  HGB 14.9  PLT 270     Date Time aPTT/HL Rate/Comment 1014 1634 0.56  Therapeutic x 1 1014 2356 0.42  Therapeutic x 2     PLAN: Continue heparin infusion at 1500 units/hour. Recheck HL daily w/ AM labs while therapeutic Monitor CBC daily while on heparin infusion.  Otelia Sergeant, PharmD, [facility] 11/20/2022 12:49 AM

## 2022-11-21 DIAGNOSIS — E1169 Type 2 diabetes mellitus with other specified complication: Secondary | ICD-10-CM | POA: Diagnosis not present

## 2022-11-21 DIAGNOSIS — R0609 Other forms of dyspnea: Secondary | ICD-10-CM | POA: Diagnosis not present

## 2022-11-21 DIAGNOSIS — I429 Cardiomyopathy, unspecified: Secondary | ICD-10-CM

## 2022-11-21 DIAGNOSIS — I5021 Acute systolic (congestive) heart failure: Secondary | ICD-10-CM | POA: Insufficient documentation

## 2022-11-21 DIAGNOSIS — I4891 Unspecified atrial fibrillation: Secondary | ICD-10-CM | POA: Diagnosis not present

## 2022-11-21 DIAGNOSIS — I420 Dilated cardiomyopathy: Secondary | ICD-10-CM

## 2022-11-21 DIAGNOSIS — I5041 Acute combined systolic (congestive) and diastolic (congestive) heart failure: Secondary | ICD-10-CM

## 2022-11-21 LAB — GLUCOSE, CAPILLARY
Glucose-Capillary: 87 mg/dL (ref 70–99)
Glucose-Capillary: 110 mg/dL — ABNORMAL HIGH (ref 70–99)
Glucose-Capillary: 72 mg/dL (ref 70–99)
Glucose-Capillary: 83 mg/dL (ref 70–99)

## 2022-11-21 LAB — BASIC METABOLIC PANEL
Anion gap: 9 (ref 5–15)
BUN: 22 mg/dL — ABNORMAL HIGH (ref 6–20)
CO2: 23 mmol/L (ref 22–32)
Calcium: 8.7 mg/dL — ABNORMAL LOW (ref 8.9–10.3)
Chloride: 102 mmol/L (ref 98–111)
Creatinine, Ser: 1.01 mg/dL (ref 0.61–1.24)
GFR, Estimated: >60 mL/min (ref >60)
Glucose, Bld: 94 mg/dL (ref 70–99)
Potassium: 3.9 mmol/L (ref 3.5–5.1)
Sodium: 134 mmol/L — ABNORMAL LOW (ref 135–145)

## 2022-11-21 LAB — CBC
HCT: 42.5 % (ref 39.0–52.0)
Hemoglobin: 13.8 g/dL (ref 13.0–17.0)
MCH: 28.8 pg (ref 26.0–34.0)
MCHC: 32.5 g/dL (ref 30.0–36.0)
MCV: 88.5 fL (ref 80.0–100.0)
Platelets: 234 10*3/uL (ref 150–400)
RBC: 4.8 MIL/uL (ref 4.22–5.81)
RDW: 14 % (ref 11.5–15.5)
WBC: 10 10*3/uL (ref 4.0–10.5)
nRBC: 0 % (ref 0.0–0.2)

## 2022-11-21 LAB — HEPARIN LEVEL (UNFRACTIONATED)
Heparin Unfractionated: 0.11 [IU]/mL — ABNORMAL LOW (ref 0.30–0.70)
Heparin Unfractionated: 0.34 [IU]/mL (ref 0.30–0.70)
Heparin Unfractionated: 0.34 [IU]/mL (ref 0.30–0.70)

## 2022-11-21 MED ORDER — FUROSEMIDE 10 MG/ML IJ SOLN
20.0000 mg | Freq: Three times a day (TID) | INTRAMUSCULAR | Status: DC
  Administered 2022-11-21 – 2022-11-23 (×6): 20 mg via INTRAVENOUS
  Filled 2022-11-21 (×6): qty 2

## 2022-11-21 MED ORDER — HEPARIN BOLUS VIA INFUSION
3000.0000 [IU] | Freq: Once | INTRAVENOUS | Status: AC
  Administered 2022-11-21: 3000 [IU] via INTRAVENOUS
  Filled 2022-11-21: qty 3000

## 2022-11-21 MED ORDER — DIGOXIN 250 MCG PO TABS
0.2500 mg | ORAL_TABLET | Freq: Every day | ORAL | Status: DC
  Administered 2022-11-22 – 2022-11-30 (×9): 0.25 mg via ORAL
  Filled 2022-11-21 (×9): qty 1

## 2022-11-21 MED ORDER — DIGOXIN 0.25 MG/ML IJ SOLN
0.2500 mg | INTRAMUSCULAR | Status: AC
  Administered 2022-11-21 (×2): 0.25 mg via INTRAVENOUS
  Filled 2022-11-21 (×3): qty 2

## 2022-11-21 NOTE — Plan of Care (Signed)

## 2022-11-21 NOTE — Consult Note (Signed)
Pharmacy Consult Note - Anticoagulation  Pharmacy Consult for heparin Indication: atrial fibrillation  PATIENT MEASUREMENTS: Height: 5\' 6"  (167.6 cm) Weight: (!) 149.7 kg (330 lb) IBW/kg (Calculated) : 63.8 HEPARIN DW (KG): 100.7  VITAL SIGNS: Temp: 97.6 F (36.4 C) (10/16 1146) Temp Source: Oral (10/16 1146) BP: 111/98 (10/16 1146) Pulse Rate: 106 (10/16 1146)  Recent Labs    11/19/22 0533 11/19/22 0734 11/19/22 1634 11/21/22 0524 11/21/22 1253  HGB 14.9  --    < > 13.8  --   HCT 47.7  --    < > 42.5  --   PLT 270  --    < > 234  --   APTT 32  --   --   --   --   LABPROT 16.0*  --   --   --   --   INR 1.3*  --   --   --   --   HEPARINUNFRC  --   --    < > 0.11* 0.34  CREATININE 1.27*  --    < > 1.01  --   TROPONINIHS 8 9  --   --   --    < > = values in this interval not displayed.    Estimated Creatinine Clearance: 114.8 mL/min (by C-G formula based on SCr of 1.01 mg/dL).  PAST MEDICAL HISTORY: Past Medical History:  Diagnosis Date   Allergy    Arthritis    Colon polyps    Diabetes mellitus, type 2 (HCC)    Fatigue 04/15/2020   GERD (gastroesophageal reflux disease)    Hydrocele, left    Hyperlipidemia 05/23/2020   Morbid obesity (HCC)    Screening for malignant neoplasm of prostate 04/15/2020   Sleep apnea    Vitamin D deficiency     ASSESSMENT: 57 y.o. male with PMH obesity, T2DM, HLD, OSA on CPAP is presenting with atrial fibrillation. Patient is not on chronic anticoagulation per chart review. HR currently uncontrolled and ECG notable for Afib with RVR as well as right ventricular hypertrophy. CHADSVASc appears to be 1 or 2. Pharmacy has been consulted to initiate and manage heparin intravenous infusion.  Pertinent medications: Enoxaparin 75 mg subQ given morning of 10/14  Baseline anticoagulation labs: Recent Labs    11/19/22 0533 11/20/22 0527 11/21/22 0524  APTT 32  --   --   INR 1.3*  --   --   HGB 14.9 13.7 13.8  PLT 270 237 234    Goal(s) of therapy: Heparin level 0.3 - 0.7 units/mL Monitor platelets by anticoagulation protocol: Yes   Date Time HL  Rate/Comment 1014 1634 0.56  Therapeutic x 1 1014 2356 0.42  Therapeutic x 2 1015 0527 0.35  Therapeutic x 3 1016 0524 0.11  Subtherapeutic 1016 1253 0.34  Therapeutic x 1 at 1800 un/hr     PLAN: Continue heparin infusion to 1800 units/hour. Recheck HL in 6 hrs to confirm therapeutic rate Monitor CBC daily while on heparin infusion.  Abran Gavigan Rodriguez-Guzman PharmD, BCPS 11/21/2022 1:32 PM

## 2022-11-21 NOTE — Progress Notes (Signed)
PROGRESS NOTE    Casey Velez   YQM:[id number] DOB: 1965-04-01  DOA: 11/19/2022 Date of Service: 11/21/22 which is hospital day 2  PCP: Marjie Skiff, NP    HPI: Casey Velez is a 57 y.o. male with medical history significant of morbid obesity, type 2 diabetes, hyperlipidemia presenting with SOB x1 month with exertion and productive cough - states nothing got worse for him to come in to ED but wants to get checked out  Hospital course / significant events:  10/14: to ED, admitted to hospitalist service for Afib RVR, suspected CHF, started on diltiazem and heparin, cardiology consulted 10/15: Echocardiogram yesterday evening (+)LVEF 30-35% with global hypokinesis, mild LVH. RV mildly dilated with moderately reduced contraction and mod Pulm HTN (RVSP 50 mmHg). There is mild to moderate tricuspid and mild mitral regurgitation. CVP severely elevated. --> d/c diltiazem and started on beta blocker, consider digoxin, planning for cardioversion probably 10/17, planning ischemia eval outpatient  10/16: stable pending cardioversion, cardiology following and increased lasix, digoxin to po tomorrow  Consultants:  Cardiology   Procedures/Surgeries: None       ASSESSMENT & PLAN:   Atrial fibrillation with rapid ventricular response, new diagnosis, POA CHA2DS2-VASc of 2 (diabetes and CHF) cardiology planning on TEE/DCCV  continue heparin drip   Acute systolic congestive heart failure, new diagnosis, POA Echo showed EF of 30 to 35%  Metoprolol titrating for rate control <100 bmp Digoxin IV --> po dosing tomorrow  Continue IV Lasix Strict I&O, daily weight GDMT as blood pressure allows according to cardiologist recs Outpatient ischemic w/u    OSA on CPAP CPAP   GERD without esophagitis Continue PPI therapy   Morbid obesity (HCC) BMI in 50s weight loss with dieting lifestyle modification    Hyperlipidemia associated with type 2 diabetes mellitus (HCC) Continue statin  therapy   Type 2 diabetes mellitus with morbid obesity (HCC) Continue insulin therapy Monitor glucose level closely    Morbid obesity based on BMI: Body mass index is 53.26 kg/m.   DVT prophylaxis: on heparin for Afib  IV fluids: no continuous IV fluids  Nutrition: cardiac/carb diet  Central lines / invasive devices: none  Code Status: FULL CODE  ACP documentation reviewed: 11/21/22 and none on file in [facility]  TOC needs: TBD Barriers to dispo / significant pending items: cardioversion pending, possible for d/c after post-procedure monitoring per cardiology              Subjective / Brief ROS:  Patient reports feeling okay today no concerns Denies CP/SOB.  Pain controlled.  Denies new weakness.  Tolerating diet.  Reports no concerns w/ urination/defecation.   Family Communication: none at this time     Objective Findings:  Vitals:   11/21/22 0004 11/21/22 0311 11/21/22 0812 11/21/22 1146  BP:  119/84 107/84 (!) 111/98  Pulse:   97 (!) 106  Resp: 17 18 18 20   Temp:  97.6 F (36.4 C) 98.3 F (36.8 C) 97.6 F (36.4 C)  TempSrc:   Oral Oral  SpO2:  96% 95% 94%  Weight:      Height:        Intake/Output Summary (Last 24 hours) at 11/21/2022 1413 Last data filed at 11/21/2022 1146 Gross per 24 hour  Intake 804.86 ml  Output 2225 ml  Net -1420.14 ml   Filed Weights   11/19/22 0528  Weight: (!) 149.7 kg    Examination:  Physical Exam Constitutional:      General: He is  not in acute distress.    Appearance: He is obese.  Cardiovascular:     Rate and Rhythm: Normal rate and regular rhythm.  Pulmonary:     Effort: Pulmonary effort is normal.     Breath sounds: Normal breath sounds.  Musculoskeletal:     Right lower leg: Edema present.     Left lower leg: Edema present.  Neurological:     General: No focal deficit present.     Mental Status: He is alert and oriented to person, place, and time.  Psychiatric:        Mood and Affect: Mood  normal.        Behavior: Behavior normal.          Scheduled Medications:   [START ON 11/22/2022] digoxin  0.25 mg Oral Daily   furosemide  20 mg Intravenous Q8H   insulin aspart  0-9 Units Subcutaneous TID WC   metoprolol tartrate  12.5 mg Oral Q6H    Continuous Infusions:  heparin 1,800 Units/hr (11/21/22 0935)    PRN Medications:  ondansetron **OR** ondansetron (ZOFRAN) IV, mouth rinse  Antimicrobials from admission:  Anti-infectives (From admission, onward)    None           Data Reviewed:  I have personally reviewed the following...  CBC: Recent Labs  Lab 11/19/22 0533 11/20/22 0527 11/21/22 0524  WBC 11.2* 11.7* 10.0  NEUTROABS 8.0*  --   --   HGB 14.9 13.7 13.8  HCT 47.7 42.0 42.5  MCV 92.6 89.6 88.5  PLT 270 237 234   Basic Metabolic Panel: Recent Labs  Lab 11/19/22 0533 11/20/22 0527 11/21/22 0524  NA 140 136 134*  K 4.0 3.7 3.9  CL 106 103 102  CO2 22 22 23   GLUCOSE 105* 106* 94  BUN 23* 18 22*  CREATININE 1.27* 0.91 1.01  CALCIUM 8.8* 8.8* 8.7*  MG 1.9  --   --    GFR: Estimated Creatinine Clearance: 114.8 mL/min (by C-G formula based on SCr of 1.01 mg/dL). Liver Function Tests: Recent Labs  Lab 11/19/22 0533 11/20/22 0527  AST 26 21  ALT 20 19  ALKPHOS 83 75  BILITOT 0.7 1.8*  PROT 7.2 7.1  ALBUMIN 3.8 3.8   No results for input(s): "LIPASE", "AMYLASE" in the last 168 hours. No results for input(s): "AMMONIA" in the last 168 hours. Coagulation Profile: Recent Labs  Lab 11/19/22 0533  INR 1.3*   Cardiac Enzymes: No results for input(s): "CKTOTAL", "CKMB", "CKMBINDEX", "TROPONINI" in the last 168 hours. BNP (last 3 results) No results for input(s): "PROBNP" in the last 8760 hours. HbA1C: Recent Labs    11/19/22 2356  HGBA1C 6.2*   CBG: Recent Labs  Lab 11/20/22 1217 11/20/22 1611 11/20/22 1954 11/21/22 0809 11/21/22 1142  GLUCAP 108* 90 109* 87 110*   Lipid Profile: Recent Labs    11/19/22 0805   CHOL 56  HDL 18*  LDLCALC 33  TRIG 25  CHOLHDL 3.1   Thyroid Function Tests: Recent Labs    11/19/22 0638  TSH 2.290   Anemia Panel: No results for input(s): "VITAMINB12", "FOLATE", "FERRITIN", "TIBC", "IRON", "RETICCTPCT" in the last 72 hours. Most Recent Urinalysis On File:     Component Value Date/Time   COLORURINE YELLOW (A) 07/15/2020 0528   APPEARANCEUR Clear 02/01/2021 0932   LABSPEC 1.031 (H) 07/15/2020 0528   PHURINE 5.0 07/15/2020 0528   GLUCOSEU Negative 02/01/2021 0932   HGBUR NEGATIVE 07/15/2020 0528   BILIRUBINUR Negative 02/01/2021  0932   KETONESUR NEGATIVE 07/15/2020 0528   PROTEINUR Negative 02/01/2021 0932   PROTEINUR 30 (A) 07/15/2020 0528   UROBILINOGEN 0.2 04/15/2020 1037   NITRITE Negative 02/01/2021 0932   NITRITE NEGATIVE 07/15/2020 0528   LEUKOCYTESUR Negative 02/01/2021 0932   LEUKOCYTESUR NEGATIVE 07/15/2020 0528   Sepsis Labs: @LABRCNTIP (procalcitonin:4,lacticidven:4) Microbiology: No results found for this or any previous visit (from the past 240 hour(s)).    Radiology Studies last 3 days: ECHOCARDIOGRAM COMPLETE  Result Date: 11/20/2022    ECHOCARDIOGRAM REPORT   Patient Name:   DAMONIE ELLENWOOD Date of Exam: 11/19/2022 Medical Rec #:  [id number]      Height:       66.0 in Accession #:    [id number]     Weight:       330.0 lb Date of Birth:  1965/05/31     BSA:          2.474 m Patient Age:    56 years       BP:           119/97 mmHg Patient Gender: M              HR:           106 bpm. Exam Location:  [facility] Procedure: 2D Echo, Cardiac Doppler, Color Doppler and Intracardiac            Opacification Agent Indications:     I48.91 Atrial Fibrillation  History:         Patient has no prior history of Echocardiogram examinations.                  Signs/Symptoms:Fatigue; Risk Factors:Dyslipidemia, Sleep Apnea                  and Diabetes.  Sonographer:     Daphine Deutscher RDCS Referring Phys:  [id number] Francoise Schaumann [place] Diagnosing Phys: Yvonne Kendall MD IMPRESSIONS  1. Left ventricular ejection fraction, by estimation, is 30 to 35%. The left ventricle has moderate to severely decreased function. The left ventricle demonstrates global hypokinesis. The left ventricular internal cavity size was moderately dilated. There is mild left ventricular hypertrophy. Left ventricular diastolic function could not be evaluated.  2. Right ventricular systolic function is moderately reduced. The right ventricular size is mildly enlarged. There is moderately elevated pulmonary artery systolic pressure.  3. Left atrial size was mildly dilated.  4. A small pericardial effusion is present.  5. The mitral valve is normal in structure. Mild mitral valve regurgitation.  6. Tricuspid valve regurgitation is mild to moderate.  7. The aortic valve is tricuspid. Aortic valve regurgitation is not visualized. No aortic stenosis is present.  8. There is mild dilatation of the ascending aorta, measuring 42 mm.  9. The inferior vena cava is dilated in size with <50% respiratory variability, suggesting right atrial pressure of 15 mmHg. FINDINGS  Left Ventricle: Left ventricular ejection fraction, by estimation, is 30 to 35%. The left ventricle has moderate to severely decreased function. The left ventricle demonstrates global hypokinesis. Definity contrast agent was given IV to delineate the left ventricular endocardial borders. The left ventricular internal cavity size was moderately dilated. There is mild left ventricular hypertrophy. Left ventricular diastolic function could not be evaluated due to atrial fibrillation. Left ventricular diastolic function could not be evaluated. Right Ventricle: The right ventricular size is mildly enlarged. No increase in right ventricular wall thickness. Right ventricular systolic function is moderately reduced. There is moderately elevated  pulmonary artery systolic pressure. The tricuspid regurgitant velocity is 2.94 m/s, and with an assumed right atrial  pressure of 15 mmHg, the estimated right ventricular systolic pressure is 49.6 mmHg. Left Atrium: Left atrial size was mildly dilated. Right Atrium: Right atrial size was normal in size. Pericardium: A small pericardial effusion is present. Mitral Valve: The mitral valve is normal in structure. Mild mitral valve regurgitation. Tricuspid Valve: The tricuspid valve is not well visualized. Tricuspid valve regurgitation is mild to moderate. Aortic Valve: The aortic valve is tricuspid. Aortic valve regurgitation is not visualized. No aortic stenosis is present. Aortic valve mean gradient measures 4.2 mmHg. Aortic valve peak gradient measures 7.3 mmHg. Aortic valve area, by VTI measures 2.07 cm. Pulmonic Valve: The pulmonic valve was not well visualized. Pulmonic valve regurgitation is trivial. No evidence of pulmonic stenosis. Aorta: The aortic root is normal in size and structure. There is mild dilatation of the ascending aorta, measuring 42 mm. Pulmonary Artery: The pulmonary artery is not well seen. Venous: The inferior vena cava is dilated in size with less than 50% respiratory variability, suggesting right atrial pressure of 15 mmHg. IAS/Shunts: No atrial level shunt detected by color flow Doppler.  LEFT VENTRICLE PLAX 2D LVIDd:         6.20 cm LVIDs:         4.70 cm LV PW:         1.10 cm LV IVS:        1.04 cm LVOT diam:     2.10 cm LV SV:         38 LV SV Index:   16 LVOT Area:     3.46 cm  RIGHT VENTRICLE            IVC RV Basal diam:  4.70 cm    IVC diam: 2.70 cm RV S prime:     7.51 cm/s TAPSE (M-mode): 1.6 cm LEFT ATRIUM             Index        RIGHT ATRIUM           Index LA diam:        5.50 cm 2.22 cm/m   RA Area:     15.00 cm LA Vol (A2C):   67.2 ml 27.16 ml/m  RA Volume:   41.50 ml  16.77 ml/m LA Vol (A4C):   84.7 ml 34.23 ml/m LA Biplane Vol: 75.5 ml 30.52 ml/m  AORTIC VALVE AV Area (Vmax):    2.11 cm AV Area (Vmean):   2.05 cm AV Area (VTI):     2.07 cm AV Vmax:           134.75 cm/s AV  Vmean:          95.840 cm/s AV VTI:            0.185 m AV Peak Grad:      7.3 mmHg AV Mean Grad:      4.2 mmHg LVOT Vmax:         82.23 cm/s LVOT Vmean:        56.667 cm/s LVOT VTI:          0.111 m LVOT/AV VTI ratio: 0.60  AORTA Ao Root diam: 3.60 cm Ao Asc diam:  4.20 cm MV E velocity: 92.98 cm/s  TRICUSPID VALVE  TR Peak grad:   34.6 mmHg                            TR Vmax:        294.00 cm/s                             SHUNTS                            Systemic VTI:  0.11 m                            Systemic Diam: 2.10 cm Yvonne Kendall MD Electronically signed by Yvonne Kendall MD Signature Date/Time: 11/20/2022/6:55:25 AM    Final    DG Chest Port 1 View  Result Date: 11/19/2022 CLINICAL DATA:  57 year old male with history of shortness of breath. EXAM: PORTABLE CHEST 1 VIEW COMPARISON:  Chest x-ray 05/08/2017. FINDINGS: Lung volumes are normal. No consolidative airspace disease. No pleural effusions. No pneumothorax. No evidence of pulmonary edema. Enlargement of the cardiopericardial silhouette, new compared to the prior study. Upper mediastinal contours are within normal limits. IMPRESSION: New enlargement of the cardiopericardial silhouette, which could reflect cardiomegaly and/or underlying pericardial effusion. Further evaluation with echocardiography is suggested if clinically appropriate. Electronically Signed   By: Trudie Reed M.D.   On: 11/19/2022 07:15          Sunnie Nielsen, DO [facility] 11/21/2022, 2:13 PM    Dictation software may have been used to generate the above note. Typos may occur and escape review in typed/dictated notes. Please contact Dr Lyn Hollingshead directly for clarity if needed.  Staff may message me via secure chat in Epic  but this may not receive an immediate response,  please page me for urgent matters!  If 7PM-7AM, please contact night coverage [email or web address]

## 2022-11-21 NOTE — Progress Notes (Signed)
[facility] HF Stewardship  PCP: Marjie Skiff, NP  PCP-Cardiologist: None  HPI: Casey Velez is a 57 y.o. male with morbid obesity, type 2 diabetes (A1c 6.2), hyperlipidemia presenting with A-fib with RVR  who presented with worsening shortness of breath over 1 month. Troponin was negative on admission. BNP elevated to 334.5. CXR showed enlarged cardiac silhouette. TTE on 11/19/22 shoed LVEF of 30-35%, moderately reduced RV, small pericardial effusion, mild MR, mild-moderate TR,    Pertinent cardiac history: No significant cardiac history prior to admission.  Pertinent Lab Values: Creatinine, Ser  Date Value Ref Range Status  11/21/2022 1.01 0.61 - 1.24 mg/dL Final   BUN  Date Value Ref Range Status  11/21/2022 22 (H) 6 - 20 mg/dL Final  16/11/9602 12 6 - 24 mg/dL Final   Potassium  Date Value Ref Range Status  11/21/2022 3.9 3.5 - 5.1 mmol/L Final   Sodium  Date Value Ref Range Status  11/21/2022 134 (L) 135 - 145 mmol/L Final  09/19/2022 141 134 - 144 mmol/L Final   B Natriuretic Peptide  Date Value Ref Range Status  11/19/2022 334.5 (H) 0.0 - 100.0 pg/mL Final    Comment:    Performed at [facility], [address] [place], [place] [address]   Magnesium  Date Value Ref Range Status  11/19/2022 1.9 1.7 - 2.4 mg/dL Final    Comment:    Performed at [facility], [address] [place], [place] [address]   HB A1C ([facility])  Date Value Ref Range Status  09/19/2022 6.4 (H) 4.8 - 5.6 % Final    Comment:             Prediabetes: 5.7 - 6.4          Diabetes: >6.4          Glycemic control for adults with diabetes: <7.0    Hgb A1c MFr Bld  Date Value Ref Range Status  11/19/2022 6.2 (H) 4.8 - 5.6 % Final    Comment:    (NOTE) Pre diabetes:          5.7%-6.4%  Diabetes:              >6.4%  Glycemic control for   <7.0% adults with diabetes    TSH  Date Value Ref Range Status  11/19/2022 2.290 0.350 - 4.500 uIU/mL Final     Comment:    Performed by a 3rd Generation assay with a functional sensitivity of <=0.01 uIU/mL. Performed at [facility], [address] [place], [place] [address]   03/21/2022 2.410 0.450 - 4.500 uIU/mL Final    Vital Signs: Temp:  [97.6 F (36.4 C)-98.5 F (36.9 C)] 97.6 F (36.4 C) (10/16 0311) Pulse Rate:  [99-109] 109 (10/15 1955) Cardiac Rhythm: Atrial fibrillation (10/15 1900) Resp:  [17-24] 18 (10/16 0311) BP: (101-129)/(77-94) 119/84 (10/16 0311) SpO2:  [91 %-96 %] 96 % (10/16 0311)   Intake/Output Summary (Last 24 hours) at 11/21/2022 0746 Last data filed at 11/21/2022 0338 Gross per 24 hour  Intake 1044.04 ml  Output 2025 ml  Net -980.96 ml    Current Inpatient Medications:  -None  Prior to admission HF Medications:  -Jardiance 10 mg daily  Assessment: 1. Acute heart failure (LVEF 30-35%), due to unknown etiology. NYHA class II-III symptoms.  -Symptoms: Shortness of breath is improved from previous. Still experiencing orthopnea.  -Volume: Still volume up. Cardiology increased furosemide to q8h dosing. -Hemodynamics: BP stable in  100-120s/90s and HR is 100s. Remains in AF. -BB: Metoprolol tartrate 12.5 mg every 6 hours for rate control. Can consolidate to metoprolol succinate prior to discharge. -ACEI/ARB/ARNI: None at this time, can consider prior to discharge. -MRA: Potassium is <4. Would benefit from spironolactone to maintain normokalemia as well as for HFrEF.  -SGLT2i: Taking Jardiance for T2DM prior to admission. Continue given cardiovascular benefits.  -Cards planning potential TEE DCCV this admission when euvolemic.   Plan: 1) Medication changes recommended at this time: -Can consider starting spironolactone 25 mg daily   -Can consider resuming home Jardiance  2) Patient assistance: -Entresto requires Prior Authorization, Eliquis copay is $50.00, Xarelto copay is $50.00, [place] Not covered, Jardiance copay is $50.00  -Eligible for  copay cards  3) Education: - Patient has been educated on current HF medications and potential additions to HF medication regimen - Patient verbalizes understanding that over the next few months, these medication doses may change and more medications may be added to optimize HF regimen - Patient has been educated on basic disease state pathophysiology and goals of therapy  Medication Assistance / Insurance Benefits Check:  Does the patient have prescription insurance?    Type of insurance plan:   Does the patient qualify for medication assistance through manufacturers or grants? No   Outpatient Pharmacy:  Prior to admission outpatient pharmacy: [facility]   Is the patient willing to utilize a [facility] pharmacy at discharge?: Yes  Please do not hesitate to reach out with questions or concerns,  Enos Fling, PharmD, CPP, BCPS Heart Failure Pharmacist  Phone - [phone] 11/21/2022 7:46 AM

## 2022-11-21 NOTE — Plan of Care (Signed)
  Problem: Education: Goal: Knowledge of disease or condition will improve Outcome: Progressing Goal: Understanding of medication regimen will improve Outcome: Progressing   Problem: Activity: Goal: Ability to tolerate increased activity will improve Outcome: Progressing   Problem: Cardiac: Goal: Ability to achieve and maintain adequate cardiopulmonary perfusion will improve Outcome: Progressing   Problem: Health Behavior/Discharge Planning: Goal: Ability to safely manage health-related needs after discharge will improve Outcome: Progressing   Problem: Activity: Goal: Risk for activity intolerance will decrease Outcome: Progressing

## 2022-11-21 NOTE — Consult Note (Signed)
Pharmacy Consult Note - Anticoagulation  Pharmacy Consult for heparin Indication: atrial fibrillation  PATIENT MEASUREMENTS: Height: 5\' 6"  (167.6 cm) Weight: (!) 149.7 kg (330 lb) IBW/kg (Calculated) : 63.8 HEPARIN DW (KG): 100.7  VITAL SIGNS: Temp: 98.3 F (36.8 C) (10/16 1549) Temp Source: Oral (10/16 1549) BP: 108/91 (10/16 1549) Pulse Rate: 89 (10/16 1549)  Recent Labs    11/19/22 0533 11/19/22 0734 11/19/22 1634 11/21/22 0524 11/21/22 1253 11/21/22 1837  HGB 14.9  --    < > 13.8  --   --   HCT 47.7  --    < > 42.5  --   --   PLT 270  --    < > 234  --   --   APTT 32  --   --   --   --   --   LABPROT 16.0*  --   --   --   --   --   INR 1.3*  --   --   --   --   --   HEPARINUNFRC  --   --    < > 0.11*   < > 0.34  CREATININE 1.27*  --    < > 1.01  --   --   TROPONINIHS 8 9  --   --   --   --    < > = values in this interval not displayed.    Estimated Creatinine Clearance: 114.8 mL/min (by C-G formula based on SCr of 1.01 mg/dL).  PAST MEDICAL HISTORY: Past Medical History:  Diagnosis Date   Allergy    Arthritis    Colon polyps    Diabetes mellitus, type 2 (HCC)    Fatigue 04/15/2020   GERD (gastroesophageal reflux disease)    Hydrocele, left    Hyperlipidemia 05/23/2020   Morbid obesity (HCC)    Screening for malignant neoplasm of prostate 04/15/2020   Sleep apnea    Vitamin D deficiency    ASSESSMENT: 57 y.o. male with PMH obesity, T2DM, HLD, OSA on CPAP is presenting with atrial fibrillation. Patient is not on chronic anticoagulation per chart review. HR currently uncontrolled and ECG notable for Afib with RVR as well as right ventricular hypertrophy. CHADSVASc appears to be 1 or 2. Pharmacy has been consulted to initiate and manage heparin intravenous infusion.  Pertinent medications: Enoxaparin 75 mg subQ given morning of 10/14  Baseline anticoagulation labs: Recent Labs    11/19/22 0533 11/20/22 0527 11/21/22 0524  APTT 32  --   --   INR 1.3*   --   --   HGB 14.9 13.7 13.8  PLT 270 237 234   Goal(s) of therapy: Heparin level 0.3 - 0.7 units/mL Monitor platelets by anticoagulation protocol: Yes   Date Time HL  Rate/Comment 1014 1634 0.56  Therapeutic x 1 1014 2356 0.42  Therapeutic x 2 1015 0527 0.35  Therapeutic x 3 1016 0524 0.11  Subtherapeutic 1016 1253 0.34  Therapeutic x 1  10/16   1837   0.34                 Therapeutic x 2     PLAN: Continue heparin infusion at 1800 units/hr  Transition to daily HL monitoring given therapeutic rate  Continue to monitor H/H and platelets   Littie Deeds, PharmD Pharmacy Resident  11/21/2022 7:22 PM

## 2022-11-21 NOTE — Progress Notes (Signed)
Rounding Note    Patient Name: Casey Velez Date of Encounter: 11/21/2022  [facility] Cardiologist: New  Subjective   UOP -2L. Patient is still in afib with labile rates. He is overall asymptomatic. He denies chest pain or SOB.   Inpatient Medications    Scheduled Meds:  furosemide  20 mg Intravenous BID   insulin aspart  0-9 Units Subcutaneous TID WC   metoprolol tartrate  12.5 mg Oral Q6H   Continuous Infusions:  heparin 1,800 Units/hr (11/21/22 0655)   PRN Meds: ondansetron **OR** ondansetron (ZOFRAN) IV, mouth rinse   Vital Signs    Vitals:   11/20/22 2351 11/21/22 0004 11/21/22 0311 11/21/22 0812  BP: 113/86  119/84 107/84  Pulse:    97  Resp: 20 17 18 18   Temp: 98 F (36.7 C)  97.6 F (36.4 C) 98.3 F (36.8 C)  TempSrc: Oral   Oral  SpO2: 95%  96% 95%  Weight:      Height:        Intake/Output Summary (Last 24 hours) at 11/21/2022 0826 Last data filed at 11/21/2022 0338 Gross per 24 hour  Intake 1044.04 ml  Output 2025 ml  Net -980.96 ml      11/19/2022    5:28 AM 10/25/2022    8:40 AM 09/19/2022    8:12 AM  Last 3 Weights  Weight (lbs) 330 lb 325 lb 3.2 oz 324 lb 9.6 oz  Weight (kg) 149.687 kg 147.51 kg 147.238 kg      Telemetry    Afib HR around 100s - Personally Reviewed  ECG    No new - Personally Reviewed  Physical Exam   GEN: No acute distress.   Neck: No JVD Cardiac: Irreg Irreg, no murmurs, rubs, or gallops.  Respiratory: Clear to auscultation bilaterally. GI: Soft, nontender, non-distended  MS: 1+ lower leg edema; No deformity. Neuro:  Nonfocal  Psych: Normal affect   Labs    High Sensitivity Troponin:   Recent Labs  Lab 11/19/22 0533 11/19/22 0734  TROPONINIHS 8 9     Chemistry Recent Labs  Lab 11/19/22 0533 11/20/22 0527 11/21/22 0524  NA 140 136 134*  K 4.0 3.7 3.9  CL 106 103 102  CO2 22 22 23   GLUCOSE 105* 106* 94  BUN 23* 18 22*  CREATININE 1.27* 0.91 1.01  CALCIUM 8.8* 8.8* 8.7*   MG 1.9  --   --   PROT 7.2 7.1  --   ALBUMIN 3.8 3.8  --   AST 26 21  --   ALT 20 19  --   ALKPHOS 83 75  --   BILITOT 0.7 1.8*  --   GFRNONAA >60 >60 >60  ANIONGAP 12 11 9     Lipids  Recent Labs  Lab 11/19/22 0805  CHOL 56  TRIG 25  HDL 18*  LDLCALC 33  CHOLHDL 3.1    Hematology Recent Labs  Lab 11/19/22 0533 11/20/22 0527 11/21/22 0524  WBC 11.2* 11.7* 10.0  RBC 5.15 4.69 4.80  HGB 14.9 13.7 13.8  HCT 47.7 42.0 42.5  MCV 92.6 89.6 88.5  MCH 28.9 29.2 28.8  MCHC 31.2 32.6 32.5  RDW 13.9 14.1 14.0  PLT 270 237 234   Thyroid  Recent Labs  Lab 11/19/22 0638  TSH 2.290    BNP Recent Labs  Lab 11/19/22 0533  BNP 334.5*    DDimer No results for input(s): "DDIMER" in the last 168 hours.   Radiology  ECHOCARDIOGRAM COMPLETE  Result Date: 11/20/2022    ECHOCARDIOGRAM REPORT   Patient Name:   Casey Velez Date of Exam: 11/19/2022 Medical Rec #:  [id number]      Height:       66.0 in Accession #:    [id number]     Weight:       330.0 lb Date of Birth:  Feb 06, 1965     BSA:          2.474 m Patient Age:    57 years       BP:           119/97 mmHg Patient Gender: M              HR:           106 bpm. Exam Location:  [facility] Procedure: 2D Echo, Cardiac Doppler, Color Doppler and Intracardiac            Opacification Agent Indications:     I48.91 Atrial Fibrillation  History:         Patient has no prior history of Echocardiogram examinations.                  Signs/Symptoms:Fatigue; Risk Factors:Dyslipidemia, Sleep Apnea                  and Diabetes.  Sonographer:     Daphine Deutscher RDCS Referring Phys:  [id number] Francoise Schaumann [place] Diagnosing Phys: Yvonne Kendall MD IMPRESSIONS  1. Left ventricular ejection fraction, by estimation, is 30 to 35%. The left ventricle has moderate to severely decreased function. The left ventricle demonstrates global hypokinesis. The left ventricular internal cavity size was moderately dilated. There is mild left ventricular hypertrophy.  Left ventricular diastolic function could not be evaluated.  2. Right ventricular systolic function is moderately reduced. The right ventricular size is mildly enlarged. There is moderately elevated pulmonary artery systolic pressure.  3. Left atrial size was mildly dilated.  4. A small pericardial effusion is present.  5. The mitral valve is normal in structure. Mild mitral valve regurgitation.  6. Tricuspid valve regurgitation is mild to moderate.  7. The aortic valve is tricuspid. Aortic valve regurgitation is not visualized. No aortic stenosis is present.  8. There is mild dilatation of the ascending aorta, measuring 42 mm.  9. The inferior vena cava is dilated in size with <50% respiratory variability, suggesting right atrial pressure of 15 mmHg. FINDINGS  Left Ventricle: Left ventricular ejection fraction, by estimation, is 30 to 35%. The left ventricle has moderate to severely decreased function. The left ventricle demonstrates global hypokinesis. Definity contrast agent was given IV to delineate the left ventricular endocardial borders. The left ventricular internal cavity size was moderately dilated. There is mild left ventricular hypertrophy. Left ventricular diastolic function could not be evaluated due to atrial fibrillation. Left ventricular diastolic function could not be evaluated. Right Ventricle: The right ventricular size is mildly enlarged. No increase in right ventricular wall thickness. Right ventricular systolic function is moderately reduced. There is moderately elevated pulmonary artery systolic pressure. The tricuspid regurgitant velocity is 2.94 m/s, and with an assumed right atrial pressure of 15 mmHg, the estimated right ventricular systolic pressure is 49.6 mmHg. Left Atrium: Left atrial size was mildly dilated. Right Atrium: Right atrial size was normal in size. Pericardium: A small pericardial effusion is present. Mitral Valve: The mitral valve is normal in structure. Mild mitral valve  regurgitation. Tricuspid Valve: The tricuspid valve is not well visualized. Tricuspid  valve regurgitation is mild to moderate. Aortic Valve: The aortic valve is tricuspid. Aortic valve regurgitation is not visualized. No aortic stenosis is present. Aortic valve mean gradient measures 4.2 mmHg. Aortic valve peak gradient measures 7.3 mmHg. Aortic valve area, by VTI measures 2.07 cm. Pulmonic Valve: The pulmonic valve was not well visualized. Pulmonic valve regurgitation is trivial. No evidence of pulmonic stenosis. Aorta: The aortic root is normal in size and structure. There is mild dilatation of the ascending aorta, measuring 42 mm. Pulmonary Artery: The pulmonary artery is not well seen. Venous: The inferior vena cava is dilated in size with less than 50% respiratory variability, suggesting right atrial pressure of 15 mmHg. IAS/Shunts: No atrial level shunt detected by color flow Doppler.  LEFT VENTRICLE PLAX 2D LVIDd:         6.20 cm LVIDs:         4.70 cm LV PW:         1.10 cm LV IVS:        1.04 cm LVOT diam:     2.10 cm LV SV:         38 LV SV Index:   16 LVOT Area:     3.46 cm  RIGHT VENTRICLE            IVC RV Basal diam:  4.70 cm    IVC diam: 2.70 cm RV S prime:     7.51 cm/s TAPSE (M-mode): 1.6 cm LEFT ATRIUM             Index        RIGHT ATRIUM           Index LA diam:        5.50 cm 2.22 cm/m   RA Area:     15.00 cm LA Vol (A2C):   67.2 ml 27.16 ml/m  RA Volume:   41.50 ml  16.77 ml/m LA Vol (A4C):   84.7 ml 34.23 ml/m LA Biplane Vol: 75.5 ml 30.52 ml/m  AORTIC VALVE AV Area (Vmax):    2.11 cm AV Area (Vmean):   2.05 cm AV Area (VTI):     2.07 cm AV Vmax:           134.75 cm/s AV Vmean:          95.840 cm/s AV VTI:            0.185 m AV Peak Grad:      7.3 mmHg AV Mean Grad:      4.2 mmHg LVOT Vmax:         82.23 cm/s LVOT Vmean:        56.667 cm/s LVOT VTI:          0.111 m LVOT/AV VTI ratio: 0.60  AORTA Ao Root diam: 3.60 cm Ao Asc diam:  4.20 cm MV E velocity: 92.98 cm/s  TRICUSPID VALVE                             TR Peak grad:   34.6 mmHg                            TR Vmax:        294.00 cm/s                             SHUNTS  Systemic VTI:  0.11 m                            Systemic Diam: 2.10 cm Yvonne Kendall MD Electronically signed by Yvonne Kendall MD Signature Date/Time: 11/20/2022/6:55:25 AM    Final     Cardiac Studies   Echo    1. Left ventricular ejection fraction, by estimation, is 30 to 35%. The  left ventricle has moderate to severely decreased function. The left  ventricle demonstrates global hypokinesis. The left ventricular internal  cavity size was moderately dilated.  There is mild left ventricular hypertrophy. Left ventricular diastolic  function could not be evaluated.   2. Right ventricular systolic function is moderately reduced. The right  ventricular size is mildly enlarged. There is moderately elevated  pulmonary artery systolic pressure.   3. Left atrial size was mildly dilated.   4. A small pericardial effusion is present.   5. The mitral valve is normal in structure. Mild mitral valve  regurgitation.   6. Tricuspid valve regurgitation is mild to moderate.   7. The aortic valve is tricuspid. Aortic valve regurgitation is not  visualized. No aortic stenosis is present.   8. There is mild dilatation of the ascending aorta, measuring 42 mm.   9. The inferior vena cava is dilated in size with <50% respiratory  variability, suggesting right atrial pressure of 15 mmHg.    Patient Profile     57 y.o. male with a hx of obesity, DM2, HLD, OSA on CPAP who is being seen 11/19/2022 for the evaluation of new onset afib   Assessment & Plan    New onset Afib with RVR - patient presented with 1 month of exertional SOB found to be in rapid afib started on IV dilt with improvement of HR - echo showed reduced EF>iv dilt held and started on BB - CHADSVASC at least 2 (DM2, CHF) continue with IV heparin. Will require long-term  a/c - Keep Mag>2 and K>4 - TSH wnl - patient is still in Afib with labile rates - continue metoprolol 12.6 mg Q6hours - dig IV x 2 today and can start oral dig tomorrow - he will need TEE/DCCV once volume status has been optimized    Acute systolic heart failure - echo showed reduced LVEF 30-35%, global HK, mild LVH, moderately reduced RVSF, mild MR, small pericardial effusion, mild MR - BNP 334 - difficult to assess volume status given body habitus, but suspect he is volume overload - IV lasix 20mg  Q8H - Net - - check daily weights, strict I/Os and kidney function   HLD - LDL 33 -PTA Lipitor 80mg  daily   DM2 -  A1C 6.2 - per IM   OSA - he reports compliance with CPAP  For questions or updates, please contact [facility] Please consult [email or web address] for contact info under        Signed, Yvonne Stopher David Stall, PA-C  11/21/2022, 8:26 AM

## 2022-11-21 NOTE — Hospital Course (Addendum)
HPI: Casey Velez is a 57 y.o. male with medical history significant of morbid obesity, type 2 diabetes, hyperlipidemia presenting with SOB x1 month with exertion and productive cough - states nothing got worse for him to come in to ED but wants to get checked out  Hospital course / significant events:  10/14: to ED, admitted to hospitalist service for Afib RVR, suspected CHF, started on diltiazem and heparin, cardiology consulted 10/15: Echocardiogram yesterday evening (+)LVEF 30-35% with global hypokinesis, mild LVH. RV mildly dilated with moderately reduced contraction and mod Pulm HTN (RVSP 50 mmHg). There is mild to moderate tricuspid and mild mitral regurgitation. CVP severely elevated. Planning ischemia eval outpatient. Plan DCCV when diuresed  10/16 -10/19: continue diuresis. Plan DCCV Monday 10/21. Net IO Since Admission: -14,865.77 mL [11/24/22 0907]. 10/19 UOP slowing somewhat  10/19: tachycardic but no symptoms  10/20: HR improved, UOP has leveled off, await DCCV tomorrow  10/21: TEE/DCCV today, still in afib, continue on IV amiodarone and consider repeat cardioversion   Consultants:  Cardiology   Procedures/Surgeries: 11/26/22 TEE, DC cardioversion - remains in Afib      ASSESSMENT & PLAN:   Atrial fibrillation with rapid ventricular response, new diagnosis, POA CHA2DS2-VASc of 2 (diabetes and CHF) cardiology planning on TEE/DCCV tomorrow Metoprolol titrating for rate control <100 bmp, limited d/t soft BP --> metoprolol 12.5 mg po q6h continue Eliquis TEE/DCCV today, still in afib --> continue on IV amiodarone and consider repeat cardioversion    Acute systolic congestive heart failure, new diagnosis, POA Echo showed EF of 30 to 35%  metoprolol 12.5 mg po q6h Digoxin po, cardiology adjusting as needed  Lasix 20 mg daily  Strict I&O, daily weight GDMT as blood pressure allows according to cardiologist recs Outpatient ischemic w/u    OSA on CPAP CPAP   GERD  without esophagitis Continue PPI therapy   Morbid obesity (HCC) BMI in 50s weight loss with dieting lifestyle modification    Hyperlipidemia associated with type 2 diabetes mellitus (HCC) Continue statin therapy   Type 2 diabetes mellitus with morbid obesity (HCC) Continue insulin therapy Monitor glucose level closely    Morbid obesity based on BMI: Body mass index is 53.26 kg/m.   DVT prophylaxis: Eliquis IV fluids: no continuous IV fluids  Nutrition: cardiac/carb diet  Central lines / invasive devices: none  Code Status: FULL CODE  ACP documentation reviewed: 11/21/22 and none on file in [facility]  TOC needs: TBD Barriers to dispo / significant pending items: on amiodarone, possible repeat cardioversion pending

## 2022-11-21 NOTE — Plan of Care (Signed)
  Problem: Education: Goal: Knowledge of disease or condition will improve Outcome: Progressing   Problem: Activity: Goal: Ability to tolerate increased activity will improve Outcome: Progressing   Problem: Cardiac: Goal: Ability to achieve and maintain adequate cardiopulmonary perfusion will improve Outcome: Progressing   Problem: Clinical Measurements: Goal: Cardiovascular complication will be avoided Outcome: Progressing   Problem: Clinical Measurements: Goal: Respiratory complications will improve Outcome: Progressing   Problem: Nutritional: Goal: Progress toward achieving an optimal weight will improve Outcome: Progressing   Problem: Fluid Volume: Goal: Ability to maintain a balanced intake and output will improve Outcome: Progressing

## 2022-11-21 NOTE — Consult Note (Signed)
Pharmacy Consult Note - Anticoagulation  Pharmacy Consult for heparin Indication: atrial fibrillation  PATIENT MEASUREMENTS: Height: 5\' 6"  (167.6 cm) Weight: (!) 149.7 kg (330 lb) IBW/kg (Calculated) : 63.8 HEPARIN DW (KG): 100.7  VITAL SIGNS: Temp: 97.6 F (36.4 C) (10/16 0311) Temp Source: Oral (10/15 2351) BP: 119/84 (10/16 0311) Pulse Rate: 109 (10/15 1955)  Recent Labs    11/19/22 0533 11/19/22 0734 11/19/22 1634 11/21/22 0524  HGB 14.9  --    < > 13.8  HCT 47.7  --    < > 42.5  PLT 270  --    < > 234  APTT 32  --   --   --   LABPROT 16.0*  --   --   --   INR 1.3*  --   --   --   HEPARINUNFRC  --   --    < > 0.11*  CREATININE 1.27*  --    < > 1.01  TROPONINIHS 8 9  --   --    < > = values in this interval not displayed.    Estimated Creatinine Clearance: 114.8 mL/min (by C-G formula based on SCr of 1.01 mg/dL).  PAST MEDICAL HISTORY: Past Medical History:  Diagnosis Date   Allergy    Arthritis    Colon polyps    Diabetes mellitus, type 2 (HCC)    Fatigue 04/15/2020   GERD (gastroesophageal reflux disease)    Hydrocele, left    Hyperlipidemia 05/23/2020   Morbid obesity (HCC)    Screening for malignant neoplasm of prostate 04/15/2020   Sleep apnea    Vitamin D deficiency     ASSESSMENT: 56 y.o. male with PMH obesity, T2DM, HLD, OSA on CPAP is presenting with atrial fibrillation. Patient is not on chronic anticoagulation per chart review. HR currently uncontrolled and ECG notable for Afib with RVR as well as right ventricular hypertrophy. CHADSVASc appears to be 1 or 2. Pharmacy has been consulted to initiate and manage heparin intravenous infusion.  Pertinent medications: Enoxaparin 75 mg subQ given morning of 10/14  Goal(s) of therapy: Heparin level 0.3 - 0.7 units/mL Monitor platelets by anticoagulation protocol: Yes   Baseline anticoagulation labs: Recent Labs    11/19/22 0533 11/20/22 0527 11/21/22 0524  APTT 32  --   --   INR 1.3*  --    --   HGB 14.9 13.7 13.8  PLT 270 237 234    Date Time aPTT/HL Rate/Comment 1014 1634 0.56  Therapeutic x 1 1014 2356 0.42  Therapeutic x 2 1015 0527 0.35  Therapeutic x 3 1016 0524 0.11  Subtherapeutic     PLAN: Bolus 3000 units x 1 Increase heparin infusion to 1800 units/hour. Recheck HL in 6 hrs after rate change Monitor CBC daily while on heparin infusion.  Otelia Sergeant, PharmD, [facility] 11/21/2022 6:46 AM

## 2022-11-22 DIAGNOSIS — R0609 Other forms of dyspnea: Secondary | ICD-10-CM | POA: Diagnosis not present

## 2022-11-22 DIAGNOSIS — I4891 Unspecified atrial fibrillation: Secondary | ICD-10-CM | POA: Diagnosis not present

## 2022-11-22 DIAGNOSIS — I420 Dilated cardiomyopathy: Secondary | ICD-10-CM | POA: Diagnosis not present

## 2022-11-22 DIAGNOSIS — E1169 Type 2 diabetes mellitus with other specified complication: Secondary | ICD-10-CM | POA: Diagnosis not present

## 2022-11-22 LAB — BASIC METABOLIC PANEL
Anion gap: 11 (ref 5–15)
BUN: 17 mg/dL (ref 6–20)
CO2: 27 mmol/L (ref 22–32)
Calcium: 8.9 mg/dL (ref 8.9–10.3)
Chloride: 98 mmol/L (ref 98–111)
Creatinine, Ser: 0.94 mg/dL (ref 0.61–1.24)
GFR, Estimated: >60 mL/min (ref >60)
Glucose, Bld: 99 mg/dL (ref 70–99)
Potassium: 3.5 mmol/L (ref 3.5–5.1)
Sodium: 136 mmol/L (ref 135–145)

## 2022-11-22 LAB — CBC
HCT: 44.6 % (ref 39.0–52.0)
Hemoglobin: 14.6 g/dL (ref 13.0–17.0)
MCH: 28.7 pg (ref 26.0–34.0)
MCHC: 32.7 g/dL (ref 30.0–36.0)
MCV: 87.8 fL (ref 80.0–100.0)
Platelets: 245 10*3/uL (ref 150–400)
RBC: 5.08 MIL/uL (ref 4.22–5.81)
RDW: 13.6 % (ref 11.5–15.5)
WBC: 10.6 10*3/uL — ABNORMAL HIGH (ref 4.0–10.5)
nRBC: 0 % (ref 0.0–0.2)

## 2022-11-22 LAB — HEPARIN LEVEL (UNFRACTIONATED): Heparin Unfractionated: 0.18 [IU]/mL — ABNORMAL LOW (ref 0.30–0.70)

## 2022-11-22 LAB — GLUCOSE, CAPILLARY
Glucose-Capillary: 91 mg/dL (ref 70–99)
Glucose-Capillary: 106 mg/dL — ABNORMAL HIGH (ref 70–99)
Glucose-Capillary: 103 mg/dL — ABNORMAL HIGH (ref 70–99)
Glucose-Capillary: 114 mg/dL — ABNORMAL HIGH (ref 70–99)

## 2022-11-22 LAB — MAGNESIUM: Magnesium: 2 mg/dL (ref 1.7–2.4)

## 2022-11-22 MED ORDER — POTASSIUM CHLORIDE CRYS ER 20 MEQ PO TBCR
40.0000 meq | EXTENDED_RELEASE_TABLET | Freq: Once | ORAL | Status: AC
  Administered 2022-11-22: 40 meq via ORAL
  Filled 2022-11-22: qty 2

## 2022-11-22 MED ORDER — HEPARIN BOLUS VIA INFUSION
3000.0000 [IU] | Freq: Once | INTRAVENOUS | Status: AC
  Administered 2022-11-22: 3000 [IU] via INTRAVENOUS
  Filled 2022-11-22: qty 3000

## 2022-11-22 MED ORDER — APIXABAN 5 MG PO TABS
5.0000 mg | ORAL_TABLET | Freq: Two times a day (BID) | ORAL | Status: DC
  Administered 2022-11-22 – 2022-11-30 (×17): 5 mg via ORAL
  Filled 2022-11-22 (×17): qty 1

## 2022-11-22 NOTE — Plan of Care (Signed)
  Problem: Respiratory: Goal: Will regain and/or maintain adequate ventilation Outcome: Progressing   Problem: Fluid Volume: Goal: Ability to maintain a balanced intake and output will improve Outcome: Progressing   Problem: Pain Managment: Goal: General experience of comfort will improve Outcome: Progressing   Problem: Elimination: Goal: Will not experience complications related to urinary retention Outcome: Progressing   Problem: Activity: Goal: Risk for activity intolerance will decrease Outcome: Progressing   Problem: Clinical Measurements: Goal: Cardiovascular complication will be avoided Outcome: Progressing   Problem: Clinical Measurements: Goal: Will remain free from infection Outcome: Progressing

## 2022-11-22 NOTE — Progress Notes (Signed)
PROGRESS NOTE    Casey Velez   ZOX:[id number] DOB: 1965/11/29  DOA: 11/19/2022 Date of Service: 11/22/22 which is hospital day 3  PCP: Casey Skiff, NP    HPI: Casey Velez is a 57 y.o. male with medical history significant of morbid obesity, type 2 diabetes, hyperlipidemia presenting with SOB x1 month with exertion and productive cough - states nothing got worse for him to come in to ED but wants to get checked out  Hospital course / significant events:  10/14: to ED, admitted to hospitalist service for Afib RVR, suspected CHF, started on diltiazem and heparin, cardiology consulted 10/15: Echocardiogram yesterday evening (+)LVEF 30-35% with global hypokinesis, mild LVH. RV mildly dilated with moderately reduced contraction and mod Pulm HTN (RVSP 50 mmHg). There is mild to moderate tricuspid and mild mitral regurgitation. CVP severely elevated. --> d/c diltiazem and started on beta blocker, consider digoxin, planning for cardioversion probably 10/17, planning ischemia eval outpatient  10/16: stable pending cardioversion, cardiology following and increased lasix, digoxin to po tomorrow 10/17: per cardio continue diuresis. Net IO Since Admission: -7.6 L, npo mn in case able to cardiovert tomorrow   Consultants:  Cardiology   Procedures/Surgeries: None       ASSESSMENT & PLAN:   Atrial fibrillation with rapid ventricular response, new diagnosis, POA CHA2DS2-VASc of 2 (diabetes and CHF) cardiology planning on TEE/DCCV  continue heparin drip   Acute systolic congestive heart failure, new diagnosis, POA Echo showed EF of 30 to 35%  Metoprolol titrating for rate control <100 bmp Digoxin IV --> po dosing tomorrow  Continue IV Lasix Strict I&O, daily weight GDMT as blood pressure allows according to cardiologist recs Outpatient ischemic w/u    OSA on CPAP CPAP   GERD without esophagitis Continue PPI therapy   Morbid obesity (HCC) BMI in 50s weight loss with  dieting lifestyle modification    Hyperlipidemia associated with type 2 diabetes mellitus (HCC) Continue statin therapy   Type 2 diabetes mellitus with morbid obesity (HCC) Continue insulin therapy Monitor glucose level closely    Morbid obesity based on BMI: Body mass index is 53.26 kg/m.   DVT prophylaxis: on heparin for Afib  IV fluids: no continuous IV fluids  Nutrition: cardiac/carb diet  Central lines / invasive devices: none  Code Status: FULL CODE  ACP documentation reviewed: 11/21/22 and none on file in [facility]  TOC needs: TBD Barriers to dispo / significant pending items: cardioversion pending, possible for d/c after post-procedure monitoring per cardiology              Subjective / Brief ROS:  Patient reports feeling okay today no concerns Denies CP/SOB.  Pain controlled.  Denies new weakness.  Tolerating diet.  Reports no concerns w/ urination/defecation.   Family Communication: none at this time     Objective Findings:  Vitals:   11/22/22 0326 11/22/22 0751 11/22/22 1145 11/22/22 1545  BP: 110/85 101/67 (!) 122/92 122/81  Pulse: 97 99 95 (!) 56  Resp: 18 20 20 20   Temp: 97.7 F (36.5 C) 98.2 F (36.8 C) 98.5 F (36.9 C) 98.7 F (37.1 C)  TempSrc: Oral Oral Oral Oral  SpO2: 93% 96% 94% 93%  Weight:      Height:        Intake/Output Summary (Last 24 hours) at 11/22/2022 1618 Last data filed at 11/22/2022 1145 Gross per 24 hour  Intake 783.75 ml  Output 6450 ml  Net -5666.25 ml   [employer]  11/19/22 0528  Weight: (!) 149.7 kg    Examination:  Physical Exam Constitutional:      General: He is not in acute distress.    Appearance: He is obese.  Cardiovascular:     Rate and Rhythm: Normal rate and regular rhythm.  Pulmonary:     Effort: Pulmonary effort is normal.     Breath sounds: Normal breath sounds.  Musculoskeletal:     Right lower leg: Edema present.     Left lower leg: Edema present.  Neurological:      General: No focal deficit present.     Mental Status: He is alert and oriented to person, place, and time.  Psychiatric:        Mood and Affect: Mood normal.        Behavior: Behavior normal.          Scheduled Medications:   apixaban  5 mg Oral BID   digoxin  0.25 mg Oral Daily   furosemide  20 mg Intravenous Q8H   insulin aspart  0-9 Units Subcutaneous TID WC   metoprolol tartrate  12.5 mg Oral Q6H    Continuous Infusions:    PRN Medications:  ondansetron **OR** ondansetron (ZOFRAN) IV, mouth rinse  Antimicrobials from admission:  Anti-infectives (From admission, onward)    None           Data Reviewed:  I have personally reviewed the following...  CBC: Recent Labs  Lab 11/19/22 0533 11/20/22 0527 11/21/22 0524 11/22/22 0335  WBC 11.2* 11.7* 10.0 10.6*  NEUTROABS 8.0*  --   --   --   HGB 14.9 13.7 13.8 14.6  HCT 47.7 42.0 42.5 44.6  MCV 92.6 89.6 88.5 87.8  PLT 270 237 234 245   Basic Metabolic Panel: Recent Labs  Lab 11/19/22 0533 11/20/22 0527 11/21/22 0524 11/22/22 0849  NA 140 136 134* 136  K 4.0 3.7 3.9 3.5  CL 106 103 102 98  CO2 22 22 23 27   GLUCOSE 105* 106* 94 99  BUN 23* 18 22* 17  CREATININE 1.27* 0.91 1.01 0.94  CALCIUM 8.8* 8.8* 8.7* 8.9  MG 1.9  --   --  2.0   GFR: Estimated Creatinine Clearance: 123.3 mL/min (by C-G formula based on SCr of 0.94 mg/dL). Liver Function Tests: Recent Labs  Lab 11/19/22 0533 11/20/22 0527  AST 26 21  ALT 20 19  ALKPHOS 83 75  BILITOT 0.7 1.8*  PROT 7.2 7.1  ALBUMIN 3.8 3.8   No results for input(s): "LIPASE", "AMYLASE" in the last 168 hours. No results for input(s): "AMMONIA" in the last 168 hours. Coagulation Profile: Recent Labs  Lab 11/19/22 0533  INR 1.3*   Cardiac Enzymes: No results for input(s): "CKTOTAL", "CKMB", "CKMBINDEX", "TROPONINI" in the last 168 hours. BNP (last 3 results) No results for input(s): "PROBNP" in the last 8760 hours. HbA1C: Recent Labs     11/19/22 2356  HGBA1C 6.2*   CBG: Recent Labs  Lab 11/21/22 1544 11/21/22 2049 11/22/22 0747 11/22/22 1141 11/22/22 1542  GLUCAP 72 83 91 106* 103*   Lipid Profile: No results for input(s): "CHOL", "HDL", "LDLCALC", "TRIG", "CHOLHDL", "LDLDIRECT" in the last 72 hours.  Thyroid Function Tests: No results for input(s): "TSH", "T4TOTAL", "FREET4", "T3FREE", "THYROIDAB" in the last 72 hours.  Anemia Panel: No results for input(s): "VITAMINB12", "FOLATE", "FERRITIN", "TIBC", "IRON", "RETICCTPCT" in the last 72 hours. Most Recent Urinalysis On File:     Component Value Date/Time   COLORURINE YELLOW (A)  07/15/2020 0528   APPEARANCEUR Clear 02/01/2021 0932   LABSPEC 1.031 (H) 07/15/2020 0528   PHURINE 5.0 07/15/2020 0528   GLUCOSEU Negative 02/01/2021 0932   HGBUR NEGATIVE 07/15/2020 0528   BILIRUBINUR Negative 02/01/2021 0932   KETONESUR NEGATIVE 07/15/2020 0528   PROTEINUR Negative 02/01/2021 0932   PROTEINUR 30 (A) 07/15/2020 0528   UROBILINOGEN 0.2 04/15/2020 1037   NITRITE Negative 02/01/2021 0932   NITRITE NEGATIVE 07/15/2020 0528   LEUKOCYTESUR Negative 02/01/2021 0932   LEUKOCYTESUR NEGATIVE 07/15/2020 0528   Sepsis Labs: @LABRCNTIP (procalcitonin:4,lacticidven:4) Microbiology: No results found for this or any previous visit (from the past 240 hour(s)).    Radiology Studies last 3 days: ECHOCARDIOGRAM COMPLETE  Result Date: 11/20/2022    ECHOCARDIOGRAM REPORT   Patient Name:   INRI SOBIESKI Date of Exam: 11/19/2022 Medical Rec #:  [id number]      Height:       66.0 in Accession #:    [id number]     Weight:       330.0 lb Date of Birth:  May 17, 1965     BSA:          2.474 m Patient Age:    56 years       BP:           119/97 mmHg Patient Gender: M              HR:           106 bpm. Exam Location:  [facility] Procedure: 2D Echo, Cardiac Doppler, Color Doppler and Intracardiac            Opacification Agent Indications:     I48.91 Atrial Fibrillation  History:          Patient has no prior history of Echocardiogram examinations.                  Signs/Symptoms:Fatigue; Risk Factors:Dyslipidemia, Sleep Apnea                  and Diabetes.  Sonographer:     Daphine Deutscher RDCS Referring Phys:  [id number] Francoise Schaumann [place] Diagnosing Phys: Yvonne Kendall MD IMPRESSIONS  1. Left ventricular ejection fraction, by estimation, is 30 to 35%. The left ventricle has moderate to severely decreased function. The left ventricle demonstrates global hypokinesis. The left ventricular internal cavity size was moderately dilated. There is mild left ventricular hypertrophy. Left ventricular diastolic function could not be evaluated.  2. Right ventricular systolic function is moderately reduced. The right ventricular size is mildly enlarged. There is moderately elevated pulmonary artery systolic pressure.  3. Left atrial size was mildly dilated.  4. A small pericardial effusion is present.  5. The mitral valve is normal in structure. Mild mitral valve regurgitation.  6. Tricuspid valve regurgitation is mild to moderate.  7. The aortic valve is tricuspid. Aortic valve regurgitation is not visualized. No aortic stenosis is present.  8. There is mild dilatation of the ascending aorta, measuring 42 mm.  9. The inferior vena cava is dilated in size with <50% respiratory variability, suggesting right atrial pressure of 15 mmHg. FINDINGS  Left Ventricle: Left ventricular ejection fraction, by estimation, is 30 to 35%. The left ventricle has moderate to severely decreased function. The left ventricle demonstrates global hypokinesis. Definity contrast agent was given IV to delineate the left ventricular endocardial borders. The left ventricular internal cavity size was moderately dilated. There is mild left ventricular hypertrophy. Left ventricular diastolic function could not be evaluated due  to atrial fibrillation. Left ventricular diastolic function could not be evaluated. Right Ventricle: The right  ventricular size is mildly enlarged. No increase in right ventricular wall thickness. Right ventricular systolic function is moderately reduced. There is moderately elevated pulmonary artery systolic pressure. The tricuspid regurgitant velocity is 2.94 m/s, and with an assumed right atrial pressure of 15 mmHg, the estimated right ventricular systolic pressure is 49.6 mmHg. Left Atrium: Left atrial size was mildly dilated. Right Atrium: Right atrial size was normal in size. Pericardium: A small pericardial effusion is present. Mitral Valve: The mitral valve is normal in structure. Mild mitral valve regurgitation. Tricuspid Valve: The tricuspid valve is not well visualized. Tricuspid valve regurgitation is mild to moderate. Aortic Valve: The aortic valve is tricuspid. Aortic valve regurgitation is not visualized. No aortic stenosis is present. Aortic valve mean gradient measures 4.2 mmHg. Aortic valve peak gradient measures 7.3 mmHg. Aortic valve area, by VTI measures 2.07 cm. Pulmonic Valve: The pulmonic valve was not well visualized. Pulmonic valve regurgitation is trivial. No evidence of pulmonic stenosis. Aorta: The aortic root is normal in size and structure. There is mild dilatation of the ascending aorta, measuring 42 mm. Pulmonary Artery: The pulmonary artery is not well seen. Venous: The inferior vena cava is dilated in size with less than 50% respiratory variability, suggesting right atrial pressure of 15 mmHg. IAS/Shunts: No atrial level shunt detected by color flow Doppler.  LEFT VENTRICLE PLAX 2D LVIDd:         6.20 cm LVIDs:         4.70 cm LV PW:         1.10 cm LV IVS:        1.04 cm LVOT diam:     2.10 cm LV SV:         38 LV SV Index:   16 LVOT Area:     3.46 cm  RIGHT VENTRICLE            IVC RV Basal diam:  4.70 cm    IVC diam: 2.70 cm RV S prime:     7.51 cm/s TAPSE (M-mode): 1.6 cm LEFT ATRIUM             Index        RIGHT ATRIUM           Index LA diam:        5.50 cm 2.22 cm/m   RA Area:      15.00 cm LA Vol (A2C):   67.2 ml 27.16 ml/m  RA Volume:   41.50 ml  16.77 ml/m LA Vol (A4C):   84.7 ml 34.23 ml/m LA Biplane Vol: 75.5 ml 30.52 ml/m  AORTIC VALVE AV Area (Vmax):    2.11 cm AV Area (Vmean):   2.05 cm AV Area (VTI):     2.07 cm AV Vmax:           134.75 cm/s AV Vmean:          95.840 cm/s AV VTI:            0.185 m AV Peak Grad:      7.3 mmHg AV Mean Grad:      4.2 mmHg LVOT Vmax:         82.23 cm/s LVOT Vmean:        56.667 cm/s LVOT VTI:          0.111 m LVOT/AV VTI ratio: 0.60  AORTA Ao Root diam: 3.60 cm Ao Asc diam:  4.20 cm MV E velocity: 92.98 cm/s  TRICUSPID VALVE                            TR Peak grad:   34.6 mmHg                            TR Vmax:        294.00 cm/s                             SHUNTS                            Systemic VTI:  0.11 m                            Systemic Diam: 2.10 cm Yvonne Kendall MD Electronically signed by Yvonne Kendall MD Signature Date/Time: 11/20/2022/6:55:25 AM    Final    DG Chest Port 1 View  Result Date: 11/19/2022 CLINICAL DATA:  57 year old male with history of shortness of breath. EXAM: PORTABLE CHEST 1 VIEW COMPARISON:  Chest x-ray 05/08/2017. FINDINGS: Lung volumes are normal. No consolidative airspace disease. No pleural effusions. No pneumothorax. No evidence of pulmonary edema. Enlargement of the cardiopericardial silhouette, new compared to the prior study. Upper mediastinal contours are within normal limits. IMPRESSION: New enlargement of the cardiopericardial silhouette, which could reflect cardiomegaly and/or underlying pericardial effusion. Further evaluation with echocardiography is suggested if clinically appropriate. Electronically Signed   By: Trudie Reed M.D.   On: 11/19/2022 07:15          Sunnie Nielsen, DO [facility] 11/22/2022, 4:18 PM    Dictation software may have been used to generate the above note. Typos may occur and escape review in typed/dictated notes. Please contact Dr  Lyn Hollingshead directly for clarity if needed.  Staff may message me via secure chat in Epic  but this may not receive an immediate response,  please page me for urgent matters!  If 7PM-7AM, please contact night coverage [email or web address]

## 2022-11-22 NOTE — Progress Notes (Addendum)
[facility] HF Stewardship  PCP: Marjie Skiff, NP  PCP-Cardiologist: None  HPI: Casey Velez is a 57 y.o. male with morbid obesity, type 2 diabetes (A1c 6.2), hyperlipidemia presenting with A-fib with RVR  who presented with worsening shortness of breath over 1 month. Troponin was negative on admission. BNP elevated to 334.5. CXR showed enlarged cardiac silhouette. TTE on 11/19/22 shoed LVEF of 30-35%, moderately reduced RV, small pericardial effusion, mild MR, mild-moderate TR.   Pertinent cardiac history: No significant cardiac history prior to admission.  Pertinent Lab Values: Creatinine, Ser  Date Value Ref Range Status  11/21/2022 1.01 0.61 - 1.24 mg/dL Final   BUN  Date Value Ref Range Status  11/21/2022 22 (H) 6 - 20 mg/dL Final  16/11/9602 12 6 - 24 mg/dL Final   Potassium  Date Value Ref Range Status  11/21/2022 3.9 3.5 - 5.1 mmol/L Final   Sodium  Date Value Ref Range Status  11/21/2022 134 (L) 135 - 145 mmol/L Final  09/19/2022 141 134 - 144 mmol/L Final   B Natriuretic Peptide  Date Value Ref Range Status  11/19/2022 334.5 (H) 0.0 - 100.0 pg/mL Final    Comment:    Performed at [facility], [address] [place], [place] [address]   Magnesium  Date Value Ref Range Status  11/19/2022 1.9 1.7 - 2.4 mg/dL Final    Comment:    Performed at [facility], [address] [place], [place] [address]   HB A1C ([facility])  Date Value Ref Range Status  09/19/2022 6.4 (H) 4.8 - 5.6 % Final    Comment:             Prediabetes: 5.7 - 6.4          Diabetes: >6.4          Glycemic control for adults with diabetes: <7.0    Hgb A1c MFr Bld  Date Value Ref Range Status  11/19/2022 6.2 (H) 4.8 - 5.6 % Final    Comment:    (NOTE) Pre diabetes:          5.7%-6.4%  Diabetes:              >6.4%  Glycemic control for   <7.0% adults with diabetes    TSH  Date Value Ref Range Status  11/19/2022 2.290 0.350 - 4.500 uIU/mL Final     Comment:    Performed by a 3rd Generation assay with a functional sensitivity of <=0.01 uIU/mL. Performed at [facility], [address] [place], [place] [address]   03/21/2022 2.410 0.450 - 4.500 uIU/mL Final    Vital Signs: Temp:  [97.6 F (36.4 C)-98.3 F (36.8 C)] 97.7 F (36.5 C) (10/17 0326) Pulse Rate:  [89-106] 97 (10/17 0326) Cardiac Rhythm: Atrial fibrillation (10/16 1900) Resp:  [17-20] 18 (10/17 0326) BP: (104-122)/(82-98) 110/85 (10/17 0326) SpO2:  [93 %-99 %] 93 % (10/17 0326)   Intake/Output Summary (Last 24 hours) at 11/22/2022 0742 Last data filed at 11/22/2022 0708 Gross per 24 hour  Intake 1143.75 ml  Output 7950 ml  Net -6806.25 ml    Current Inpatient Medications:  -Metoprolol tartrate 12.5 mg q6h -Digoxin 0.25 mg daily -Furosemide 20 mg IV q8h  Prior to admission HF Medications:  -Jardiance 10 mg daily  Assessment: 1. Acute heart failure (LVEF 30-35%), due to unknown etiology. NYHA class II-III symptoms.  -Symptoms: Shortness of breath and orthopnea are improved from previous but still present. Remains  with significant LEE. -Volume: Still volume up. Cardiology increased furosemide to q8h dosing. Good urine output. No BMP today. Experiencing cramping with diuresis, can check BMP and replace electrolytes. -Hemodynamics: BP stable in 100-110s/70s and HR is 90s. Remains in AF. -BB: Metoprolol tartrate 12.5 mg every 6 hours for rate control. Can consolidate to metoprolol succinate prior to discharge. Will need to reduce dose after DCCV most likely. -ACEI/ARB/ARNI: None at this time, can consider prior to discharge pending BP. -MRA: Potassium was <4 last check with no BMP today. Would benefit from spironolactone to maintain normokalemia, augment diureisis, and for HFrEF.  -SGLT2i: Taking Jardiance for T2DM prior to admission. Continue given cardiovascular benefits.  -Cards planning potential TEE DCCV this admission when euvolemic. EMANATE and  XVERT trials support transition to DOAC priro to DCCV. Eliquis requires ideally 3 doses prior to steady state. Transition afterwards is higher risk due to 1.5 days to steady state and left atrial stunning post DCCV. -Digoxin started, check level on 11/25/22 or first outpatient visit  Plan: 1) Medication changes recommended at this time: -Consider checking BMP and Mg given cramps with diuresis   -Consider swapping to Eliquis with 3 doses prior to DCCV  2) Patient assistance: -Entresto requires Prior Authorization, Eliquis copay is $50.00, Xarelto copay is $50.00, [place] Not covered, Jardiance copay is $50.00  -Eligible for copay cards  3) Education: - Patient has been educated on current HF medications and potential additions to HF medication regimen - Patient verbalizes understanding that over the next few months, these medication doses may change and more medications may be added to optimize HF regimen - Patient has been educated on basic disease state pathophysiology and goals of therapy  Medication Assistance / Insurance Benefits Check:  Does the patient have prescription insurance?    Type of insurance plan:   Does the patient qualify for medication assistance through manufacturers or grants? No   Outpatient Pharmacy:  Prior to admission outpatient pharmacy: [facility]   Is the patient willing to utilize a [facility] pharmacy at discharge?: Yes  Please do not hesitate to reach out with questions or concerns,  Enos Fling, PharmD, CPP, BCPS Heart Failure Pharmacist  Phone - [phone] 11/22/2022 7:42 AM

## 2022-11-22 NOTE — Plan of Care (Signed)

## 2022-11-22 NOTE — Consult Note (Signed)
Pharmacy Consult Note - Anticoagulation  Pharmacy Consult for heparin Indication: atrial fibrillation  PATIENT MEASUREMENTS: Height: 5\' 6"  (167.6 cm) Weight: (!) 149.7 kg (330 lb) IBW/kg (Calculated) : 63.8 HEPARIN DW (KG): 100.7  VITAL SIGNS: Temp: 97.7 F (36.5 C) (10/17 0326) Temp Source: Oral (10/17 0326) BP: 110/85 (10/17 0326) Pulse Rate: 97 (10/17 0326)  Recent Labs    11/19/22 0533 11/19/22 0734 11/19/22 1634 11/21/22 0524 11/21/22 1253 11/22/22 0335  HGB 14.9  --    < > 13.8  --  14.6  HCT 47.7  --    < > 42.5  --  44.6  PLT 270  --    < > 234  --  245  APTT 32  --   --   --   --   --   LABPROT 16.0*  --   --   --   --   --   INR 1.3*  --   --   --   --   --   HEPARINUNFRC  --   --    < > 0.11*   < > 0.18*  CREATININE 1.27*  --    < > 1.01  --   --   TROPONINIHS 8 9  --   --   --   --    < > = values in this interval not displayed.    Estimated Creatinine Clearance: 114.8 mL/min (by C-G formula based on SCr of 1.01 mg/dL).  PAST MEDICAL HISTORY: Past Medical History:  Diagnosis Date   Allergy    Arthritis    Colon polyps    Diabetes mellitus, type 2 (HCC)    Fatigue 04/15/2020   GERD (gastroesophageal reflux disease)    Hydrocele, left    Hyperlipidemia 05/23/2020   Morbid obesity (HCC)    Screening for malignant neoplasm of prostate 04/15/2020   Sleep apnea    Vitamin D deficiency    ASSESSMENT: 57 y.o. male with PMH obesity, T2DM, HLD, OSA on CPAP is presenting with atrial fibrillation. Patient is not on chronic anticoagulation per chart review. HR currently uncontrolled and ECG notable for Afib with RVR as well as right ventricular hypertrophy. CHADSVASc appears to be 1 or 2. Pharmacy has been consulted to initiate and manage heparin intravenous infusion.  Pertinent medications: Enoxaparin 75 mg subQ given morning of 10/14  Baseline anticoagulation labs: Recent Labs    11/19/22 0533 11/20/22 0527 11/21/22 0524 11/22/22 0335  APTT 32  --    --   --   INR 1.3*  --   --   --   HGB 14.9 13.7 13.8 14.6  PLT 270 237 234 245   Goal(s) of therapy: Heparin level 0.3 - 0.7 units/mL Monitor platelets by anticoagulation protocol: Yes   Date Time HL  Rate/Comment 1014 1634 0.56  Therapeutic x 1 1014 2356 0.42  Therapeutic x 2 1015 0527 0.35  Therapeutic x 3 1016 0524 0.11  Subtherapeutic 1016 1253 0.34  Therapeutic x 1  10/16   1837   0.34                 Therapeutic x 2 10/17 0335 0.18    PLAN: Bolus 3000 units x 1 Increase heparin infusion to 2100 units/hr  Recheck HL in 6 hrs after rate change. Continue to monitor H/H and platelets   Otelia Sergeant, PharmD, [facility] 11/22/2022 5:08 AM

## 2022-11-22 NOTE — Progress Notes (Signed)
Rounding Note    Patient Name: Casey Velez Date of Encounter: 11/22/2022  [facility] Cardiologist: [facility]  Subjective   Significant urine output 6 L over the past 24 hours Still with significant leg swelling, abdominal distention, reports having episodes of shortness of breath Received digoxin loading yesterday with mild improvement of heart rate, remains in atrial fibrillation rate 90-100, higher with ambulation around the room  Inpatient Medications    Scheduled Meds:  apixaban  5 mg Oral BID   digoxin  0.25 mg Oral Daily   furosemide  20 mg Intravenous Q8H   insulin aspart  0-9 Units Subcutaneous TID WC   metoprolol tartrate  12.5 mg Oral Q6H   potassium chloride  40 mEq Oral Once   Continuous Infusions:  PRN Meds: ondansetron **OR** ondansetron (ZOFRAN) IV, mouth rinse   Vital Signs    Vitals:   11/21/22 2357 11/22/22 0005 11/22/22 0326 11/22/22 0751  BP:  104/82 110/85 101/67  Pulse:  91 97 99  Resp:  17 18 20   Temp:  98.1 F (36.7 C) 97.7 F (36.5 C) 98.2 F (36.8 C)  TempSrc:   Oral Oral  SpO2: 98% 93% 93% 96%  Weight:      Height:        Intake/Output Summary (Last 24 hours) at 11/22/2022 1104 Last data filed at 11/22/2022 0751 Gross per 24 hour  Intake 1143.75 ml  Output 7750 ml  Net -6606.25 ml      11/19/2022    5:28 AM 10/25/2022    8:40 AM 09/19/2022    8:12 AM  Last 3 Weights  Weight (lbs) 330 lb 325 lb 3.2 oz 324 lb 9.6 oz  Weight (kg) 149.687 kg 147.51 kg 147.238 kg      Telemetry    Atrial fibrillation rate 90-1 10- Personally Reviewed  ECG     - Personally Reviewed  Physical Exam   GEN: No acute distress.   Neck: Unable to estimate JVD Cardiac: Irregularly irregular, no murmurs, rubs, or gallops.  2+ pitting lower extremity edema Respiratory: Clear to auscultation bilaterally. GI: Soft, nontender, non-distended  MS: Normal range of motion no deformity. Neuro:  Nonfocal  Psych: Normal affect    Labs    High Sensitivity Troponin:   Recent Labs  Lab 11/19/22 0533 11/19/22 0734  TROPONINIHS 8 9     Chemistry Recent Labs  Lab 11/19/22 0533 11/20/22 0527 11/21/22 0524 11/22/22 0849  NA 140 136 134* 136  K 4.0 3.7 3.9 3.5  CL 106 103 102 98  CO2 22 22 23 27   GLUCOSE 105* 106* 94 99  BUN 23* 18 22* 17  CREATININE 1.27* 0.91 1.01 0.94  CALCIUM 8.8* 8.8* 8.7* 8.9  MG 1.9  --   --  2.0  PROT 7.2 7.1  --   --   ALBUMIN 3.8 3.8  --   --   AST 26 21  --   --   ALT 20 19  --   --   ALKPHOS 83 75  --   --   BILITOT 0.7 1.8*  --   --   GFRNONAA >60 >60 >60 >60  ANIONGAP 12 11 9 11     Lipids  Recent Labs  Lab 11/19/22 0805  CHOL 56  TRIG 25  HDL 18*  LDLCALC 33  CHOLHDL 3.1    Hematology Recent Labs  Lab 11/20/22 0527 11/21/22 0524 11/22/22 0335  WBC 11.7* 10.0 10.6*  RBC 4.69 4.80  5.08  HGB 13.7 13.8 14.6  HCT 42.0 42.5 44.6  MCV 89.6 88.5 87.8  MCH 29.2 28.8 28.7  MCHC 32.6 32.5 32.7  RDW 14.1 14.0 13.6  PLT 237 234 245   Thyroid  Recent Labs  Lab 11/19/22 0638  TSH 2.290    BNP Recent Labs  Lab 11/19/22 0533  BNP 334.5*    DDimer No results for input(s): "DDIMER" in the last 168 hours.   Radiology    No results found.  Cardiac Studies   Echo  1. Left ventricular ejection fraction, by estimation, is 30 to 35%. The  left ventricle has moderate to severely decreased function. The left  ventricle demonstrates global hypokinesis. The left ventricular internal  cavity size was moderately dilated.  There is mild left ventricular hypertrophy. Left ventricular diastolic  function could not be evaluated.   2. Right ventricular systolic function is moderately reduced. The right  ventricular size is mildly enlarged. There is moderately elevated  pulmonary artery systolic pressure.   3. Left atrial size was mildly dilated.   4. A small pericardial effusion is present.   5. The mitral valve is normal in structure. Mild mitral valve   regurgitation.   6. Tricuspid valve regurgitation is mild to moderate.   7. The aortic valve is tricuspid. Aortic valve regurgitation is not  visualized. No aortic stenosis is present.   8. There is mild dilatation of the ascending aorta, measuring 42 mm.   9. The inferior vena cava is dilated in size with <50% respiratory  variability, suggesting right atrial pressure of 15 mmHg.   Patient Profile     57 y.o. male with a hx of obesity, DM2, HLD, OSA on CPAP who is being seen 11/19/2022 for the evaluation of new onset afib   Assessment & Plan    Atrial fibrillation with RVR Mild improvement in rate after adding digoxin load yesterday  Will continue metoprolol tartrate 12.5 every 6 hours, unable to titrate given low blood pressure Start digoxin 0.25 oral dose daily Transition to Eliquis 5 twice daily Given significant hypervolemic state on clinical exam, suspect he may not be ready for TEE cardioversion until Monday -Will make him n.p.o. in the morning tomorrow with reevaluation in the morning in case he is ready for procedure tomorrow at noon   Acute diastolic and systolic CHF Likely tachycardia mediated cardiomyopathy ejection fraction 30 to 35% on echo -6 L negative past 24 hours, stable renal function -Continue Lasix up to 20 mg IV every 8 hours -Significant edema on exam, abdominal distention   Morbid obesity We have encouraged continued exercise, careful diet management in an effort to lose weight.   Sleep apnea Reports compliance with his CPAP at home and in the hospital   Diabetes type 2 A1c relatively well-controlled 6.2   Hyperlipidemia Cholesterol at goal on Lipitor 80 daily, LDL 33  Long discussion concerning CHF management, timing of transesophageal echo and cardioversion, management of atrial fibrillation with RVR  For questions or updates, please contact [facility] Please consult [email or web address] for contact info under        Signed, Julien Nordmann, MD  11/22/2022, 11:04 AM

## 2022-11-23 ENCOUNTER — Other Ambulatory Visit: Payer: Self-pay

## 2022-11-23 DIAGNOSIS — I5041 Acute combined systolic (congestive) and diastolic (congestive) heart failure: Secondary | ICD-10-CM | POA: Diagnosis not present

## 2022-11-23 DIAGNOSIS — I4891 Unspecified atrial fibrillation: Secondary | ICD-10-CM | POA: Diagnosis not present

## 2022-11-23 LAB — BASIC METABOLIC PANEL
Anion gap: 11 (ref 5–15)
BUN: 20 mg/dL (ref 6–20)
CO2: 26 mmol/L (ref 22–32)
Calcium: 9.2 mg/dL (ref 8.9–10.3)
Chloride: 100 mmol/L (ref 98–111)
Creatinine, Ser: 0.91 mg/dL (ref 0.61–1.24)
GFR, Estimated: >60 mL/min (ref >60)
Glucose, Bld: 99 mg/dL (ref 70–99)
Potassium: 4 mmol/L (ref 3.5–5.1)
Sodium: 137 mmol/L (ref 135–145)

## 2022-11-23 LAB — GLUCOSE, CAPILLARY
Glucose-Capillary: 101 mg/dL — ABNORMAL HIGH (ref 70–99)
Glucose-Capillary: 119 mg/dL — ABNORMAL HIGH (ref 70–99)
Glucose-Capillary: 88 mg/dL (ref 70–99)

## 2022-11-23 LAB — MAGNESIUM: Magnesium: 2.2 mg/dL (ref 1.7–2.4)

## 2022-11-23 MED ORDER — EMPAGLIFLOZIN 10 MG PO TABS
10.0000 mg | ORAL_TABLET | Freq: Every day | ORAL | Status: DC
  Administered 2022-11-23 – 2022-11-27 (×5): 10 mg via ORAL
  Filled 2022-11-23 (×5): qty 1

## 2022-11-23 MED ORDER — APIXABAN 5 MG PO TABS
5.0000 mg | ORAL_TABLET | Freq: Two times a day (BID) | ORAL | 1 refills | Status: DC
  Filled 2022-11-23: qty 60, 30d supply, fill #0

## 2022-11-23 MED ORDER — DAPAGLIFLOZIN PROPANEDIOL 10 MG PO TABS: 10.0000 mg | ORAL_TABLET | Freq: Every day | ORAL | Status: DC

## 2022-11-23 MED ORDER — GABAPENTIN 100 MG PO CAPS
200.0000 mg | ORAL_CAPSULE | Freq: Every day | ORAL | Status: DC
  Administered 2022-11-23 – 2022-11-29 (×7): 200 mg via ORAL
  Filled 2022-11-23 (×7): qty 2

## 2022-11-23 MED ORDER — FUROSEMIDE 10 MG/ML IJ SOLN
20.0000 mg | Freq: Two times a day (BID) | INTRAMUSCULAR | Status: DC
  Administered 2022-11-23 – 2022-11-27 (×9): 20 mg via INTRAVENOUS
  Filled 2022-11-23 (×9): qty 2

## 2022-11-23 MED ORDER — POTASSIUM CHLORIDE CRYS ER 20 MEQ PO TBCR
20.0000 meq | EXTENDED_RELEASE_TABLET | Freq: Every day | ORAL | Status: DC
  Administered 2022-11-23 – 2022-11-30 (×8): 20 meq via ORAL
  Filled 2022-11-23 (×8): qty 1

## 2022-11-23 MED ORDER — ATORVASTATIN CALCIUM 80 MG PO TABS
80.0000 mg | ORAL_TABLET | Freq: Every day | ORAL | Status: DC
  Administered 2022-11-23 – 2022-11-29 (×7): 80 mg via ORAL
  Filled 2022-11-23 (×7): qty 1

## 2022-11-23 NOTE — Progress Notes (Signed)
Transition of Care [facility]) - Inpatient Brief Assessment   Patient Details  Name: Casey Velez MRN: [id number] Date of Birth: 28-Mar-1965  Transition of Care [facility]) CM/SW Contact:    Darolyn Rua, LCSW Phone Number: 11/23/2022, 10:55 AM   Clinical Narrative:  Patient from home presenting with afib with rvr, no identified toc needs. Please consult TOC should needs arise.   PCP Aura Dials, NP Insurance: [facility]  Transition of Care Asessment: Insurance and Status: Insurance coverage has been reviewed Patient has primary care physician: Yes Home environment has been reviewed: from home Prior level of function:: independent Prior/Current Home Services: No current home services Social Determinants of Health Reivew: SDOH reviewed no interventions necessary Readmission risk has been reviewed: Yes Transition of care needs: no transition of care needs at this time

## 2022-11-23 NOTE — Progress Notes (Signed)
PROGRESS NOTE    Casey Velez   ION:[id number] DOB: 03-25-1965  DOA: 11/19/2022 Date of Service: 11/23/22 which is hospital day 4  PCP: Marjie Skiff, NP    HPI: Casey Velez is a 57 y.o. male with medical history significant of morbid obesity, type 2 diabetes, hyperlipidemia presenting with SOB x1 month with exertion and productive cough - states nothing got worse for him to come in to ED but wants to get checked out  Hospital course / significant events:  10/14: to ED, admitted to hospitalist service for Afib RVR, suspected CHF, started on diltiazem and heparin, cardiology consulted 10/15: Echocardiogram yesterday evening (+)LVEF 30-35% with global hypokinesis, mild LVH. RV mildly dilated with moderately reduced contraction and mod Pulm HTN (RVSP 50 mmHg). There is mild to moderate tricuspid and mild mitral regurgitation. CVP severely elevated. Planning ischemia eval outpatient. Plan DCCV when diuresed  10/16 -10/18: continue diuresis. Plan DCCV Monday 10/21  Consultants:  Cardiology   Procedures/Surgeries: None       ASSESSMENT & PLAN:   Atrial fibrillation with rapid ventricular response, new diagnosis, POA CHA2DS2-VASc of 2 (diabetes and CHF) cardiology planning on TEE/DCCV  continue heparin drip   Acute systolic congestive heart failure, new diagnosis, POA Echo showed EF of 30 to 35%  Metoprolol titrating for rate control <100 bmp, limited d/t soft BP Digoxin po, checking levels tomorrow Continue IV Lasix, decreased dose today  Strict I&O, daily weight GDMT as blood pressure allows according to cardiologist recs Outpatient ischemic w/u    OSA on CPAP CPAP   GERD without esophagitis Continue PPI therapy   Morbid obesity (HCC) BMI in 50s weight loss with dieting lifestyle modification    Hyperlipidemia associated with type 2 diabetes mellitus (HCC) Continue statin therapy   Type 2 diabetes mellitus with morbid obesity (HCC) Continue insulin  therapy Monitor glucose level closely    Morbid obesity based on BMI: Body mass index is 53.26 kg/m.   DVT prophylaxis: on heparin for Afib  IV fluids: no continuous IV fluids  Nutrition: cardiac/carb diet  Central lines / invasive devices: none  Code Status: FULL CODE  ACP documentation reviewed: 11/21/22 and none on file in [facility]  TOC needs: TBD Barriers to dispo / significant pending items: cardioversion pending, possible for d/c after post-procedure monitoring per cardiology              Subjective / Brief ROS:  Patient reports feeling okay today no concerns Denies CP/SOB.  Pain controlled.  Denies new weakness.  Tolerating diet.  Reports no concerns w/ urination/defecation.   Family Communication: family at bedside on rounds      Objective Findings:  Vitals:   11/22/22 2336 11/23/22 0331 11/23/22 0810 11/23/22 1154  BP: 110/80 109/81 110/81 (!) 118/96  Pulse: 82 (!) 116 (!) 106 (!) 118  Resp: 17 18 19  (!) 22  Temp: 97.7 F (36.5 C) 97.6 F (36.4 C) (!) 97.5 F (36.4 C) 98.4 F (36.9 C)  TempSrc: Oral Oral Oral Oral  SpO2: 93% 93% 90% 91%  Weight:      Height:        Intake/Output Summary (Last 24 hours) at 11/23/2022 1530 Last data filed at 11/23/2022 1100 Gross per 24 hour  Intake 300 ml  Output 4850 ml  Net -4550 ml   Filed Weights   11/19/22 0528  Weight: (!) 149.7 kg    Examination:  Physical Exam Constitutional:      General: He is not  in acute distress.    Appearance: He is obese.  Cardiovascular:     Rate and Rhythm: Normal rate and regular rhythm.  Pulmonary:     Effort: Pulmonary effort is normal.     Breath sounds: Normal breath sounds.  Musculoskeletal:     Right lower leg: Edema present.     Left lower leg: Edema present.  Neurological:     General: No focal deficit present.     Mental Status: He is alert and oriented to person, place, and time.  Psychiatric:        Mood and Affect: Mood normal.         Behavior: Behavior normal.          Scheduled Medications:   apixaban  5 mg Oral BID   atorvastatin  80 mg Oral QHS   digoxin  0.25 mg Oral Daily   empagliflozin  10 mg Oral Daily   furosemide  20 mg Intravenous BID   metoprolol tartrate  12.5 mg Oral Q6H   potassium chloride  20 mEq Oral Daily    Continuous Infusions:    PRN Medications:  ondansetron **OR** ondansetron (ZOFRAN) IV, mouth rinse  Antimicrobials from admission:  Anti-infectives (From admission, onward)    None           Data Reviewed:  I have personally reviewed the following...  CBC: Recent Labs  Lab 11/19/22 0533 11/20/22 0527 11/21/22 0524 11/22/22 0335  WBC 11.2* 11.7* 10.0 10.6*  NEUTROABS 8.0*  --   --   --   HGB 14.9 13.7 13.8 14.6  HCT 47.7 42.0 42.5 44.6  MCV 92.6 89.6 88.5 87.8  PLT 270 237 234 245   Basic Metabolic Panel: Recent Labs  Lab 11/19/22 0533 11/20/22 0527 11/21/22 0524 11/22/22 0849 11/23/22 0412  NA 140 136 134* 136 137  K 4.0 3.7 3.9 3.5 4.0  CL 106 103 102 98 100  CO2 22 22 23 27 26   GLUCOSE 105* 106* 94 99 99  BUN 23* 18 22* 17 20  CREATININE 1.27* 0.91 1.01 0.94 0.91  CALCIUM 8.8* 8.8* 8.7* 8.9 9.2  MG 1.9  --   --  2.0 2.2   GFR: Estimated Creatinine Clearance: 127.4 mL/min (by C-G formula based on SCr of 0.91 mg/dL). Liver Function Tests: Recent Labs  Lab 11/19/22 0533 11/20/22 0527  AST 26 21  ALT 20 19  ALKPHOS 83 75  BILITOT 0.7 1.8*  PROT 7.2 7.1  ALBUMIN 3.8 3.8   No results for input(s): "LIPASE", "AMYLASE" in the last 168 hours. No results for input(s): "AMMONIA" in the last 168 hours. Coagulation Profile: Recent Labs  Lab 11/19/22 0533  INR 1.3*   Cardiac Enzymes: No results for input(s): "CKTOTAL", "CKMB", "CKMBINDEX", "TROPONINI" in the last 168 hours. BNP (last 3 results) No results for input(s): "PROBNP" in the last 8760 hours. HbA1C: No results for input(s): "HGBA1C" in the last 72 hours.  CBG: Recent Labs   Lab 11/22/22 1141 11/22/22 1542 11/22/22 2111 11/23/22 0811 11/23/22 1156  GLUCAP 106* 103* 114* 101* 119*   Lipid Profile: No results for input(s): "CHOL", "HDL", "LDLCALC", "TRIG", "CHOLHDL", "LDLDIRECT" in the last 72 hours.  Thyroid Function Tests: No results for input(s): "TSH", "T4TOTAL", "FREET4", "T3FREE", "THYROIDAB" in the last 72 hours.  Anemia Panel: No results for input(s): "VITAMINB12", "FOLATE", "FERRITIN", "TIBC", "IRON", "RETICCTPCT" in the last 72 hours. Most Recent Urinalysis On File:     Component Value Date/Time   COLORURINE YELLOW (  A) 07/15/2020 0528   APPEARANCEUR Clear 02/01/2021 0932   LABSPEC 1.031 (H) 07/15/2020 0528   PHURINE 5.0 07/15/2020 0528   GLUCOSEU Negative 02/01/2021 0932   HGBUR NEGATIVE 07/15/2020 0528   BILIRUBINUR Negative 02/01/2021 0932   KETONESUR NEGATIVE 07/15/2020 0528   PROTEINUR Negative 02/01/2021 0932   PROTEINUR 30 (A) 07/15/2020 0528   UROBILINOGEN 0.2 04/15/2020 1037   NITRITE Negative 02/01/2021 0932   NITRITE NEGATIVE 07/15/2020 0528   LEUKOCYTESUR Negative 02/01/2021 0932   LEUKOCYTESUR NEGATIVE 07/15/2020 0528   Sepsis Labs: @LABRCNTIP (procalcitonin:4,lacticidven:4) Microbiology: No results found for this or any previous visit (from the past 240 hour(s)).    Radiology Studies last 3 days: ECHOCARDIOGRAM COMPLETE  Result Date: 11/20/2022    ECHOCARDIOGRAM REPORT   Patient Name:   Casey Velez Date of Exam: 11/19/2022 Medical Rec #:  [id number]      Height:       66.0 in Accession #:    [id number]     Weight:       330.0 lb Date of Birth:  [birth year]     BSA:          2.474 m Patient Age:    56 years       BP:           119/97 mmHg Patient Gender: M              HR:           106 bpm. Exam Location:  [facility] Procedure: 2D Echo, Cardiac Doppler, Color Doppler and Intracardiac            Opacification Agent Indications:     I48.91 Atrial Fibrillation  History:         Patient has no prior history of Echocardiogram  examinations.                  Signs/Symptoms:Fatigue; Risk Factors:Dyslipidemia, Sleep Apnea                  and Diabetes.  Sonographer:     Daphine Deutscher RDCS Referring Phys:  [id number] Francoise Schaumann [place] Diagnosing Phys: Yvonne Kendall MD IMPRESSIONS  1. Left ventricular ejection fraction, by estimation, is 30 to 35%. The left ventricle has moderate to severely decreased function. The left ventricle demonstrates global hypokinesis. The left ventricular internal cavity size was moderately dilated. There is mild left ventricular hypertrophy. Left ventricular diastolic function could not be evaluated.  2. Right ventricular systolic function is moderately reduced. The right ventricular size is mildly enlarged. There is moderately elevated pulmonary artery systolic pressure.  3. Left atrial size was mildly dilated.  4. A small pericardial effusion is present.  5. The mitral valve is normal in structure. Mild mitral valve regurgitation.  6. Tricuspid valve regurgitation is mild to moderate.  7. The aortic valve is tricuspid. Aortic valve regurgitation is not visualized. No aortic stenosis is present.  8. There is mild dilatation of the ascending aorta, measuring 42 mm.  9. The inferior vena cava is dilated in size with <50% respiratory variability, suggesting right atrial pressure of 15 mmHg. FINDINGS  Left Ventricle: Left ventricular ejection fraction, by estimation, is 30 to 35%. The left ventricle has moderate to severely decreased function. The left ventricle demonstrates global hypokinesis. Definity contrast agent was given IV to delineate the left ventricular endocardial borders. The left ventricular internal cavity size was moderately dilated. There is mild left ventricular hypertrophy. Left ventricular diastolic function could not be evaluated  due to atrial fibrillation. Left ventricular diastolic function could not be evaluated. Right Ventricle: The right ventricular size is mildly enlarged. No increase in  right ventricular wall thickness. Right ventricular systolic function is moderately reduced. There is moderately elevated pulmonary artery systolic pressure. The tricuspid regurgitant velocity is 2.94 m/s, and with an assumed right atrial pressure of 15 mmHg, the estimated right ventricular systolic pressure is 49.6 mmHg. Left Atrium: Left atrial size was mildly dilated. Right Atrium: Right atrial size was normal in size. Pericardium: A small pericardial effusion is present. Mitral Valve: The mitral valve is normal in structure. Mild mitral valve regurgitation. Tricuspid Valve: The tricuspid valve is not well visualized. Tricuspid valve regurgitation is mild to moderate. Aortic Valve: The aortic valve is tricuspid. Aortic valve regurgitation is not visualized. No aortic stenosis is present. Aortic valve mean gradient measures 4.2 mmHg. Aortic valve peak gradient measures 7.3 mmHg. Aortic valve area, by VTI measures 2.07 cm. Pulmonic Valve: The pulmonic valve was not well visualized. Pulmonic valve regurgitation is trivial. No evidence of pulmonic stenosis. Aorta: The aortic root is normal in size and structure. There is mild dilatation of the ascending aorta, measuring 42 mm. Pulmonary Artery: The pulmonary artery is not well seen. Venous: The inferior vena cava is dilated in size with less than 50% respiratory variability, suggesting right atrial pressure of 15 mmHg. IAS/Shunts: No atrial level shunt detected by color flow Doppler.  LEFT VENTRICLE PLAX 2D LVIDd:         6.20 cm LVIDs:         4.70 cm LV PW:         1.10 cm LV IVS:        1.04 cm LVOT diam:     2.10 cm LV SV:         38 LV SV Index:   16 LVOT Area:     3.46 cm  RIGHT VENTRICLE            IVC RV Basal diam:  4.70 cm    IVC diam: 2.70 cm RV S prime:     7.51 cm/s TAPSE (M-mode): 1.6 cm LEFT ATRIUM             Index        RIGHT ATRIUM           Index LA diam:        5.50 cm 2.22 cm/m   RA Area:     15.00 cm LA Vol (A2C):   67.2 ml 27.16 ml/m  RA  Volume:   41.50 ml  16.77 ml/m LA Vol (A4C):   84.7 ml 34.23 ml/m LA Biplane Vol: 75.5 ml 30.52 ml/m  AORTIC VALVE AV Area (Vmax):    2.11 cm AV Area (Vmean):   2.05 cm AV Area (VTI):     2.07 cm AV Vmax:           134.75 cm/s AV Vmean:          95.840 cm/s AV VTI:            0.185 m AV Peak Grad:      7.3 mmHg AV Mean Grad:      4.2 mmHg LVOT Vmax:         82.23 cm/s LVOT Vmean:        56.667 cm/s LVOT VTI:          0.111 m LVOT/AV VTI ratio: 0.60  AORTA Ao Root diam: 3.60 cm Ao Asc  diam:  4.20 cm MV E velocity: 92.98 cm/s  TRICUSPID VALVE                            TR Peak grad:   34.6 mmHg                            TR Vmax:        294.00 cm/s                             SHUNTS                            Systemic VTI:  0.11 m                            Systemic Diam: 2.10 cm Yvonne Kendall MD Electronically signed by Yvonne Kendall MD Signature Date/Time: 11/20/2022/6:55:25 AM    Final           Sunnie Nielsen, DO [facility] 11/23/2022, 3:30 PM    Dictation software may have been used to generate the above note. Typos may occur and escape review in typed/dictated notes. Please contact Dr Lyn Hollingshead directly for clarity if needed.  Staff may message me via secure chat in Epic  but this may not receive an immediate response,  please page me for urgent matters!  If 7PM-7AM, please contact night coverage [email or web address]

## 2022-11-23 NOTE — Plan of Care (Signed)
[facility] Tonsillectomy/Adenoidectomy, Postoperative PEDS care plan entered in error.

## 2022-11-23 NOTE — Plan of Care (Signed)

## 2022-11-23 NOTE — Progress Notes (Signed)
[facility] HF Stewardship  PCP: Marjie Skiff, NP  PCP-Cardiologist: None  HPI: Casey Velez is a 57 y.o. male with morbid obesity, type 2 diabetes (A1c 6.2), hyperlipidemia presenting with A-fib with RVR  who presented with worsening shortness of breath over 1 month. Troponin was negative on admission. BNP elevated to 334.5. CXR showed enlarged cardiac silhouette. TTE on 11/19/22 showed LVEF of 30-35%, moderately reduced RV, small pericardial effusion, mild MR, mild-moderate TR.   Pertinent cardiac history: No significant cardiac history prior to admission.  Pertinent Lab Values: Creatinine, Ser  Date Value Ref Range Status  11/23/2022 0.91 0.61 - 1.24 mg/dL Final   BUN  Date Value Ref Range Status  11/23/2022 20 6 - 20 mg/dL Final  16/11/9602 12 6 - 24 mg/dL Final   Potassium  Date Value Ref Range Status  11/23/2022 4.0 3.5 - 5.1 mmol/L Final   Sodium  Date Value Ref Range Status  11/23/2022 137 135 - 145 mmol/L Final  09/19/2022 141 134 - 144 mmol/L Final   B Natriuretic Peptide  Date Value Ref Range Status  11/19/2022 334.5 (H) 0.0 - 100.0 pg/mL Final    Comment:    Performed at [facility], [address] [place], [place] [address]   Magnesium  Date Value Ref Range Status  11/23/2022 2.2 1.7 - 2.4 mg/dL Final    Comment:    Performed at [facility], [address] [place], [place] [address]   HB A1C ([facility] - [facility])  Date Value Ref Range Status  09/19/2022 6.4 (H) 4.8 - 5.6 % Final    Comment:             Prediabetes: 5.7 - 6.4          Diabetes: >6.4          Glycemic control for adults with diabetes: <7.0    Hgb A1c MFr Bld  Date Value Ref Range Status  11/19/2022 6.2 (H) 4.8 - 5.6 % Final    Comment:    (NOTE) Pre diabetes:          5.7%-6.4%  Diabetes:              >6.4%  Glycemic control for   <7.0% adults with diabetes    TSH  Date Value Ref Range Status  11/19/2022 2.290 0.350 - 4.500 uIU/mL Final    Comment:     Performed by a 3rd Generation assay with a functional sensitivity of <=0.01 uIU/mL. Performed at [facility], [address] [place], [place] [address]   03/21/2022 2.410 0.450 - 4.500 uIU/mL Final    Vital Signs: Temp:  [97.6 F (36.4 C)-98.7 F (37.1 C)] 97.6 F (36.4 C) (10/18 0331) Pulse Rate:  [56-116] 116 (10/18 0331) Cardiac Rhythm: Atrial fibrillation (10/17 1900) Resp:  [17-20] 18 (10/18 0331) BP: (109-125)/(80-92) 109/81 (10/18 0331) SpO2:  [93 %-95 %] 93 % (10/18 0331)   Intake/Output Summary (Last 24 hours) at 11/23/2022 0806 Last data filed at 11/23/2022 6213 Gross per 24 hour  Intake --  Output 4850 ml  Net -4850 ml    Current Inpatient Medications:  -Metoprolol tartrate 12.5 mg q6h -Digoxin 0.25 mg daily -Furosemide 20 mg IV q8h  Prior to admission HF Medications:  -Jardiance 10 mg daily  Assessment: 1. Acute heart failure (LVEF 30-35%), due to unknown etiology, most likely NICM from AF. NYHA class II-III symptoms.  -Symptoms: Shortness of breath and orthopnea are continuing to  improve. Remains  with significant LEE.  -Volume: Still volume up, but excellent urine output with 7L off yesterday on furosemide 20 mg IV q8h. Creatinine, BUN, and K stable.  -Hemodynamics: BP stable in 100-110s/70s and HR is 100s after starting digoxin. Remains in AF. -BB: Metoprolol tartrate 12.5 mg every 6 hours for rate control. Can consolidate to metoprolol succinate prior to discharge. Will likely need to reduce dose after DCCV. -ACEI/ARB/ARNI: None at this time, can consider prior to discharge pending BP. -MRA: Potassium now 4. Would benefit from spironolactone to maintain normokalemia, augment diureisis, and for HFrEF.  -SGLT2i: Taking Jardiance for T2DM prior to admission. Continue given cardiovascular benefits.  -Cardiology planning TEE guided DCCV when euvolemic. -Digoxin started, check level on 11/25/22 or first outpatient visit  Plan: 1) Medication  changes recommended at this time: -Consider adding spironolactone 12.5 mg daily. Can potentially reduce potassium supplementation if initiated.   2) Patient assistance: -Entresto requires Prior Authorization, Eliquis copay is $50.00, Xarelto copay is $50.00, [place] Not covered, Jardiance copay is $50.00  -Eligible for copay cards  3) Education: - Patient has been educated on current HF medications and potential additions to HF medication regimen - Patient verbalizes understanding that over the next few months, these medication doses may change and more medications may be added to optimize HF regimen - Patient has been educated on basic disease state pathophysiology and goals of therapy  Medication Assistance / Insurance Benefits Check:  Does the patient have prescription insurance?    Type of insurance plan:   Does the patient qualify for medication assistance through manufacturers or grants? No   Outpatient Pharmacy:  Prior to admission outpatient pharmacy: [facility]   Is the patient willing to utilize a [facility] pharmacy at discharge?: Yes  Please do not hesitate to reach out with questions or concerns,  Enos Fling, PharmD, CPP, BCPS Heart Failure Pharmacist  Phone - [phone] 11/23/2022 8:06 AM

## 2022-11-23 NOTE — Progress Notes (Signed)
   Patient Name: Casey Velez Date of Encounter: 11/23/2022 [facility] Cardiologist: None    Interval Summary  .    Patient seen on AM rounds. Denies any chest pain or shortness of breath. -5.3 L output in the last 24 hours. -12.9 L output since admission. Complaints of muscle cramps throughout the night. Remains in atrial fibrillation.   Vital Signs .    Vitals:   11/22/22 2106 11/22/22 2336 11/23/22 0331 11/23/22 0810  BP:  110/80 109/81 110/81  Pulse: (!) 101 82 (!) 116 (!) 106  Resp: 20 17 18 19   Temp:  97.7 F (36.5 C) 97.6 F (36.4 C) (!) 97.5 F (36.4 C)  TempSrc:  Oral Oral Oral  SpO2: 95% 93% 93% 90%  Weight:      Height:        Intake/Output Summary (Last 24 hours) at 11/23/2022 0955 Last data filed at 11/23/2022 0919 Gross per 24 hour  Intake --  Output 5600 ml  Net -5600 ml      11/19/2022    5:28 AM 10/25/2022    8:40 AM 09/19/2022    8:12 AM  Last 3 Weights  Weight (lbs) 330 lb 325 lb 3.2 oz 324 lb 9.6 oz  Weight (kg) 149.687 kg 147.51 kg 147.238 kg      Telemetry/ECG    Rate controlled atrial fibrillation rates 90-100 - Personally Reviewed  Physical Exam .   GEN: No acute distress.   Neck: Unable to assess JVD due to body habitus Cardiac: IR IR, no murmurs, rubs, or gallops.  Respiratory: Clear to auscultation bilaterally. GI: Soft, nontender,distended, obese MS: 2+ edema BLE  Assessment & Plan .     1.  Atrial fibrillation with RVR -Remains in atrial fibrillation on telemetry monitoring today -Continued on apixaban 5 mg twice daily for CHA2DS2-VASc score of at least 2 for stroke prophylaxis -Continued on digoxin 0.25 mg daily will need digoxin level to begin next week -Continued on metoprolol 12.5 mg every 6 hours -Scheduled for TEE/DCCV on Monday at 1230  -Continue with telemetry monitoring -Recommend keeping magnesium greater than 2 and potassium greater than 4  2.  Acute systolic congestive heart failure -BNP  334 -Difficult to assess volume status given body habitus -LVEF 30-35% on echocardiogram --5.3 L output in the last 24 hours and -12.9 L output since admission -Continued on furosemide 20 mg IV every 8 hours -Continued to have significant edema and abdominal distention -Started on potassium supplements daily -Daily BMP while on diuretic therapy -Continue to escalate GDMT as tolerated by blood pressure and kidney function -Daily weights, I's and O's, low-sodium diet  3.  Morbid obesity -BMI 53.26 -weight makes prognosis difficult -Weight loss is recommended with increasing activity and decreasing caloric intake  4.  Sleep apnea -Reports compliance with CPAP -Continue CPAP during hospitalization  5.  Type 2 diabetes -Hemoglobin A1c 6.2 -Continued on insulin therapy -Management per IM  6.  Hyperlipidemia -LDL 33 -Restarted PTA atorvastatin 80 mg daily   CHA2DS2-VASc Score = 2   This indicates a 2.2% annual risk of stroke. The patient's score is based upon: CHF History: 1 HTN History: 0 Diabetes History: 1 Stroke History: 0 Vascular Disease History: 0 Age Score: 0 Gender Score: 0      For questions or updates, please contact [facility] Please consult [email or web address] for contact info under        Signed, Eriq Hufford, NP

## 2022-11-24 DIAGNOSIS — I4891 Unspecified atrial fibrillation: Secondary | ICD-10-CM | POA: Diagnosis not present

## 2022-11-24 DIAGNOSIS — I429 Cardiomyopathy, unspecified: Secondary | ICD-10-CM | POA: Diagnosis not present

## 2022-11-24 LAB — BASIC METABOLIC PANEL
Anion gap: 11 (ref 5–15)
BUN: 22 mg/dL — ABNORMAL HIGH (ref 6–20)
CO2: 25 mmol/L (ref 22–32)
Calcium: 9.4 mg/dL (ref 8.9–10.3)
Chloride: 100 mmol/L (ref 98–111)
Creatinine, Ser: 0.93 mg/dL (ref 0.61–1.24)
GFR, Estimated: >60 mL/min (ref >60)
Glucose, Bld: 105 mg/dL — ABNORMAL HIGH (ref 70–99)
Potassium: 4.1 mmol/L (ref 3.5–5.1)
Sodium: 136 mmol/L (ref 135–145)

## 2022-11-24 LAB — DIGOXIN LEVEL: Digoxin Level: 0.2 ng/mL — ABNORMAL LOW (ref 0.8–2.0)

## 2022-11-24 LAB — MAGNESIUM: Magnesium: 2.4 mg/dL (ref 1.7–2.4)

## 2022-11-24 NOTE — Plan of Care (Signed)

## 2022-11-24 NOTE — Progress Notes (Signed)
   Patient Name: Casey Velez Date of Encounter: 11/24/2022 [facility] Cardiologist: None   Interval Summary  .    Patient seen on a.m. rounds.  Denies any chest pain or shortness of breath.  -1.6 L output in the last 24 hours.  No further complaints of muscle cramps throughout the night.  Remains in atrial fibrillation.  Vital Signs .    Vitals:   11/23/22 2118 11/24/22 0013 11/24/22 0431 11/24/22 0819  BP: 102/87 100/64 117/89 99/77  Pulse: (!) 53 98 (!) 115 64  Resp: 18 20 20 16   Temp: 98.5 F (36.9 C) 97.6 F (36.4 C) 97.7 F (36.5 C) 98.9 F (37.2 C)  TempSrc:    Oral  SpO2: 96% 97% 95% 94%  Weight:      Height:        Intake/Output Summary (Last 24 hours) at 11/24/2022 1001 Last data filed at 11/24/2022 0831 Gross per 24 hour  Intake 300 ml  Output 1475 ml  Net -1175 ml      11/19/2022    5:28 AM 10/25/2022    8:40 AM 09/19/2022    8:12 AM  Last 3 Weights  Weight (lbs) 330 lb 325 lb 3.2 oz 324 lb 9.6 oz  Weight (kg) 149.687 kg 147.51 kg 147.238 kg      Telemetry/ECG    Rate controlled atrial fibrillation with rates of 90-104- Personally Reviewed  Physical Exam .   GEN: No acute distress.   Neck: unable to assess JVD due to body habitus Cardiac: IR IR, no murmurs, rubs, or gallops.  Respiratory: Clear to auscultation bilaterally.Respirations are unlabored at rest on room air GI: Soft, nontender, obese, non-distended  MS:  2+ edema  Assessment & Plan .     Atrial fibrillation with RVR -Remains in atrial fibrillation is rate controlled on telemetry -Continued on apixaban 5 mg daily for CHA2DS2-VASc of at least 2 for stroke prophylaxis -Continued on dig 0.25 mg daily with dig level this morning at 0.2 -Continue metoprolol 12.5 mg every 6 hours -Scheduled for TEE/DCCV on Monday at 1230 -Continue with telemetry monitoring -Recommend keeping magnesium greater than 2 and potassium greater than 4  Acute systolic congestive heart  failure --1.6 L output in the last 24 hours --14.5 L output since admission -Difficult to assess volume status given body habitus -LVEF 30-35% on echocardiogram -Continued on furosemide 20 mg IV twice daily -Continue with Jardiance 10 mg daily -Daily BMP while on diuretic therapy -Continue to escalate GDMT as tolerated by blood pressure and kidney function, collation remains limited by soft blood pressures -Continue with heart failure education -Daily weights, I's and O's, low-sodium diet  Morbid obesity -BMI 53.26 -Makes prognoses difficult -Would benefit from weight loss  Sleep apnea -Recommend continuing CPAP during hospitalization  Type 2 diabetes -Hemoglobin A1c 6.2 -Continue insulin therapy -Continue management per IM  Hyperlipidemia -LDL 33 -Continue atorvastatin 80 mg daily For questions or updates, please contact [facility] Please consult [email or web address] for contact info under        Signed, Yzabella Crunk, NP

## 2022-11-24 NOTE — Plan of Care (Signed)
  Problem: Education: Goal: Knowledge of disease or condition will improve Outcome: Progressing Goal: Understanding of medication regimen will improve Outcome: Progressing Goal: Individualized Educational Video(s) Outcome: Progressing   Problem: Activity: Goal: Ability to tolerate increased activity will improve Outcome: Progressing   Problem: Cardiac: Goal: Ability to achieve and maintain adequate cardiopulmonary perfusion will improve Outcome: Progressing   Problem: Health Behavior/Discharge Planning: Goal: Ability to safely manage health-related needs after discharge will improve Outcome: Progressing   Problem: Education: Goal: Knowledge of General Education information will improve Description: Including pain rating scale, medication(s)/side effects and non-pharmacologic comfort measures Outcome: Progressing

## 2022-11-24 NOTE — Progress Notes (Signed)
PROGRESS NOTE    Casey Velez   ZOX:[id number] DOB: 01-09-1966  DOA: 11/19/2022 Date of Service: 11/24/22 which is hospital day 5  PCP: Marjie Skiff, NP    HPI: Casey Velez is a 57 y.o. male with medical history significant of morbid obesity, type 2 diabetes, hyperlipidemia presenting with SOB x1 month with exertion and productive cough - states nothing got worse for him to come in to ED but wants to get checked out  Hospital course / significant events:  10/14: to ED, admitted to hospitalist service for Afib RVR, suspected CHF, started on diltiazem and heparin, cardiology consulted 10/15: Echocardiogram yesterday evening (+)LVEF 30-35% with global hypokinesis, mild LVH. RV mildly dilated with moderately reduced contraction and mod Pulm HTN (RVSP 50 mmHg). There is mild to moderate tricuspid and mild mitral regurgitation. CVP severely elevated. Planning ischemia eval outpatient. Plan DCCV when diuresed  10/16 -10/19: continue diuresis. Plan DCCV Monday 10/21. Net IO Since Admission: -14,865.77 mL [11/24/22 0907]. 10/19 UOP slowing somewhat  10/19: tachycardic but no symptoms   Consultants:  Cardiology   Procedures/Surgeries: None       ASSESSMENT & PLAN:   Atrial fibrillation with rapid ventricular response, new diagnosis, POA CHA2DS2-VASc of 2 (diabetes and CHF) cardiology planning on TEE/DCCV  Metoprolol titrating for rate control <100 bmp, limited d/t soft BP --> metoprolol 12.5 mg po q6h continue heparin drip   Acute systolic congestive heart failure, new diagnosis, POA Echo showed EF of 30 to 35%  metoprolol 12.5 mg po q6h Digoxin po, cardiology adjusting as needed  Lasix 20 mg daily  Strict I&O, daily weight GDMT as blood pressure allows according to cardiologist recs Outpatient ischemic w/u    OSA on CPAP CPAP   GERD without esophagitis Continue PPI therapy   Morbid obesity (HCC) BMI in 50s weight loss with dieting lifestyle modification     Hyperlipidemia associated with type 2 diabetes mellitus (HCC) Continue statin therapy   Type 2 diabetes mellitus with morbid obesity (HCC) Continue insulin therapy Monitor glucose level closely    Morbid obesity based on BMI: Body mass index is 53.26 kg/m.   DVT prophylaxis: on heparin for Afib  IV fluids: no continuous IV fluids  Nutrition: cardiac/carb diet  Central lines / invasive devices: none  Code Status: FULL CODE  ACP documentation reviewed: 11/21/22 and none on file in [facility]  TOC needs: TBD Barriers to dispo / significant pending items: cardioversion pending, possible for d/c after post-procedure monitoring per cardiology              Subjective / Brief ROS:  Patient reports feeling okay today no concerns Does not experience palpitations  Denies CP/SOB.  Pain controlled.  Denies new weakness.  Tolerating diet.  Reports no concerns w/ urination/defecation.   Family Communication: family at bedside on rounds      Objective Findings:  Vitals:   11/24/22 0431 11/24/22 0819 11/24/22 1142 11/24/22 1520  BP: 117/89 99/77 (!) 108/93 115/87  Pulse: (!) 115 64 (!) 41 (!) 119  Resp: 20 16 18    Temp: 97.7 F (36.5 C) 98.9 F (37.2 C) 97.9 F (36.6 C) 97.8 F (36.6 C)  TempSrc:  Oral  Oral  SpO2: 95% 94% 94% 95%  Weight:      Height:        Intake/Output Summary (Last 24 hours) at 11/24/2022 1634 Last data filed at 11/24/2022 0831 Gross per 24 hour  Intake --  Output 1475 ml  Net -  1475 ml   Filed Weights   11/19/22 0528  Weight: (!) 149.7 kg    Examination:  Physical Exam Constitutional:      General: He is not in acute distress.    Appearance: He is obese.  Cardiovascular:     Rate and Rhythm: Normal rate and regular rhythm.  Pulmonary:     Effort: Pulmonary effort is normal.     Breath sounds: Normal breath sounds.  Musculoskeletal:     Right lower leg: Edema present.     Left lower leg: Edema present.  Neurological:      General: No focal deficit present.     Mental Status: He is alert and oriented to person, place, and time.  Psychiatric:        Mood and Affect: Mood normal.        Behavior: Behavior normal.          Scheduled Medications:   apixaban  5 mg Oral BID   atorvastatin  80 mg Oral QHS   digoxin  0.25 mg Oral Daily   empagliflozin  10 mg Oral Daily   furosemide  20 mg Intravenous BID   gabapentin  200 mg Oral QHS   metoprolol tartrate  12.5 mg Oral Q6H   potassium chloride  20 mEq Oral Daily    Continuous Infusions:    PRN Medications:  ondansetron **OR** ondansetron (ZOFRAN) IV, mouth rinse  Antimicrobials from admission:  Anti-infectives (From admission, onward)    None           Data Reviewed:  I have personally reviewed the following...  CBC: Recent Labs  Lab 11/19/22 0533 11/20/22 0527 11/21/22 0524 11/22/22 0335  WBC 11.2* 11.7* 10.0 10.6*  NEUTROABS 8.0*  --   --   --   HGB 14.9 13.7 13.8 14.6  HCT 47.7 42.0 42.5 44.6  MCV 92.6 89.6 88.5 87.8  PLT 270 237 234 245   Basic Metabolic Panel: Recent Labs  Lab 11/19/22 0533 11/20/22 0527 11/21/22 0524 11/22/22 0849 11/23/22 0412 11/24/22 0439  NA 140 136 134* 136 137 136  K 4.0 3.7 3.9 3.5 4.0 4.1  CL 106 103 102 98 100 100  CO2 22 22 23 27 26 25   GLUCOSE 105* 106* 94 99 99 105*  BUN 23* 18 22* 17 20 22*  CREATININE 1.27* 0.91 1.01 0.94 0.91 0.93  CALCIUM 8.8* 8.8* 8.7* 8.9 9.2 9.4  MG 1.9  --   --  2.0 2.2 2.4   GFR: Estimated Creatinine Clearance: 124.7 mL/min (by C-G formula based on SCr of 0.93 mg/dL). Liver Function Tests: Recent Labs  Lab 11/19/22 0533 11/20/22 0527  AST 26 21  ALT 20 19  ALKPHOS 83 75  BILITOT 0.7 1.8*  PROT 7.2 7.1  ALBUMIN 3.8 3.8   No results for input(s): "LIPASE", "AMYLASE" in the last 168 hours. No results for input(s): "AMMONIA" in the last 168 hours. Coagulation Profile: Recent Labs  Lab 11/19/22 0533  INR 1.3*   Cardiac Enzymes: No  results for input(s): "CKTOTAL", "CKMB", "CKMBINDEX", "TROPONINI" in the last 168 hours. BNP (last 3 results) No results for input(s): "PROBNP" in the last 8760 hours. HbA1C: No results for input(s): "HGBA1C" in the last 72 hours.  CBG: Recent Labs  Lab 11/22/22 1542 11/22/22 2111 11/23/22 0811 11/23/22 1156 11/23/22 1658  GLUCAP 103* 114* 101* 119* 88   Lipid Profile: No results for input(s): "CHOL", "HDL", "LDLCALC", "TRIG", "CHOLHDL", "LDLDIRECT" in the last 72 hours.  Thyroid Function Tests: No results for input(s): "TSH", "T4TOTAL", "FREET4", "T3FREE", "THYROIDAB" in the last 72 hours.  Anemia Panel: No results for input(s): "VITAMINB12", "FOLATE", "FERRITIN", "TIBC", "IRON", "RETICCTPCT" in the last 72 hours. Most Recent Urinalysis On File:     Component Value Date/Time   COLORURINE YELLOW (A) 07/15/2020 0528   APPEARANCEUR Clear 02/01/2021 0932   LABSPEC 1.031 (H) 07/15/2020 0528   PHURINE 5.0 07/15/2020 0528   GLUCOSEU Negative 02/01/2021 0932   HGBUR NEGATIVE 07/15/2020 0528   BILIRUBINUR Negative 02/01/2021 0932   KETONESUR NEGATIVE 07/15/2020 0528   PROTEINUR Negative 02/01/2021 0932   PROTEINUR 30 (A) 07/15/2020 0528   UROBILINOGEN 0.2 04/15/2020 1037   NITRITE Negative 02/01/2021 0932   NITRITE NEGATIVE 07/15/2020 0528   LEUKOCYTESUR Negative 02/01/2021 0932   LEUKOCYTESUR NEGATIVE 07/15/2020 0528   Sepsis Labs: @LABRCNTIP (procalcitonin:4,lacticidven:4) Microbiology: No results found for this or any previous visit (from the past 240 hour(s)).    Radiology Studies last 3 days: No results found.        Sunnie Nielsen, DO [facility] 11/24/2022, 4:34 PM    Dictation software may have been used to generate the above note. Typos may occur and escape review in typed/dictated notes. Please contact Dr Lyn Hollingshead directly for clarity if needed.  Staff may message me via secure chat in Epic  but this may not receive an immediate  response,  please page me for urgent matters!  If 7PM-7AM, please contact night coverage [email or web address]

## 2022-11-25 DIAGNOSIS — I429 Cardiomyopathy, unspecified: Secondary | ICD-10-CM | POA: Diagnosis not present

## 2022-11-25 DIAGNOSIS — I4891 Unspecified atrial fibrillation: Secondary | ICD-10-CM | POA: Diagnosis not present

## 2022-11-25 LAB — BASIC METABOLIC PANEL
Anion gap: 11 (ref 5–15)
BUN: 26 mg/dL — ABNORMAL HIGH (ref 6–20)
CO2: 25 mmol/L (ref 22–32)
Calcium: 9.4 mg/dL (ref 8.9–10.3)
Chloride: 99 mmol/L (ref 98–111)
Creatinine, Ser: 1.03 mg/dL (ref 0.61–1.24)
GFR, Estimated: >60 mL/min (ref >60)
Glucose, Bld: 102 mg/dL — ABNORMAL HIGH (ref 70–99)
Potassium: 4.1 mmol/L (ref 3.5–5.1)
Sodium: 135 mmol/L (ref 135–145)

## 2022-11-25 NOTE — Plan of Care (Signed)

## 2022-11-25 NOTE — Progress Notes (Signed)
   Patient Name: Casey Velez Date of Encounter: 11/25/2022 [facility] Cardiologist: None   Interval Summary  .    Patient seen on a.m. rounds.  Denies any chest pain or shortness of breath.  -1.8 L output in the last 24 hours.  Continues to remain in atrial fibrillation that is rate controlled  Vital Signs .    Vitals:   11/25/22 0408 11/25/22 0837 11/25/22 1135 11/25/22 1139  BP: (!) 108/91 110/87 (!) 99/38 (!) 111/94  Pulse: (!) 55 (!) 52 80 (!) 57  Resp: 18 20 20    Temp: 97.9 F (36.6 C) 98.4 F (36.9 C) 97.9 F (36.6 C)   TempSrc:  Oral Oral   SpO2: 94% 97% 97% 97%  Weight:      Height:        Intake/Output Summary (Last 24 hours) at 11/25/2022 1146 Last data filed at 11/25/2022 0837 Gross per 24 hour  Intake 1240 ml  Output 3125 ml  Net -1885 ml      11/19/2022    5:28 AM 10/25/2022    8:40 AM 09/19/2022    8:12 AM  Last 3 Weights  Weight (lbs) 330 lb 325 lb 3.2 oz 324 lb 9.6 oz  Weight (kg) 149.687 kg 147.51 kg 147.238 kg      Telemetry/ECG    Atrial fibrillation that is rate controlled- Personally Reviewed  Physical Exam .   GEN: No acute distress.   Neck: No JVD Cardiac: IR IR, no murmurs, rubs, or gallops.  Respiratory: Clear to auscultation bilaterally. GI: Soft, nontender, non-distended  MS: 1-2+ edema to BLE  Assessment & Plan .     Atrial fibrillation  -Previous episodes of RVR which is now rate controlled -Remains in atrial fibrillation on telemetry -Continued on apixaban 5 mg twice daily for CHA2DS2-VASc score of at least 2 for stroke prophylaxis -Continue on metoprolol 12.5 mg every 6 hours -Recommend keeping magnesium greater than 2 and potassium greater than 4 -Continued on dig 0.25 mg daily -Continue on telemetry monitoring -Scheduled for TEE/DCCV on Monday at 1230  Acute systolic congestive heart failure -1.8 L output in the last 24 hours -difficult to assess volume status given body habitus -LVEF 30-35% on  echocardiogram likely tachycardia mediated -Continued on furosemide 20 mg IV twice daily -Continue Jardiance 10 mg daily -Daily BMP while on diuretic therapy -Continue to escalate GDMT as tolerated by blood pressure and kidney function -Continue with heart failure education -Daily weights, I's and O's, low-sodium diet  Sleep apnea -Continue with CPAP  Hyperlipidemia -LDL 33 -Continue atorvastatin 80 mg daily  Type 2 diabetes -Continued on insulin therapy -A1c is 6.2 -Continue management per IM  Morbid obesity -BMI 53.26 -Makes prognoses difficult -Would benefit from weight loss For questions or updates, please contact [facility] Please consult [email or web address] for contact info under        Signed, Emoree Sasaki, NP

## 2022-11-25 NOTE — Progress Notes (Signed)
PROGRESS NOTE    Casey Velez   SAY:[id number] DOB: Jun 12, 1965  DOA: 11/19/2022 Date of Service: 11/25/22 which is hospital day 6  PCP: Marjie Skiff, NP    HPI: Casey Velez is a 57 y.o. male with medical history significant of morbid obesity, type 2 diabetes, hyperlipidemia presenting with SOB x1 month with exertion and productive cough - states nothing got worse for him to come in to ED but wants to get checked out  Hospital course / significant events:  10/14: to ED, admitted to hospitalist service for Afib RVR, suspected CHF, started on diltiazem and heparin, cardiology consulted 10/15: Echocardiogram yesterday evening (+)LVEF 30-35% with global hypokinesis, mild LVH. RV mildly dilated with moderately reduced contraction and mod Pulm HTN (RVSP 50 mmHg). There is mild to moderate tricuspid and mild mitral regurgitation. CVP severely elevated. Planning ischemia eval outpatient. Plan DCCV when diuresed  10/16 -10/19: continue diuresis. Plan DCCV Monday 10/21. Net IO Since Admission: -14,865.77 mL [11/24/22 0907]. 10/19 UOP slowing somewhat  10/19: tachycardic but no symptoms  10/20: HR improved, UOP has leveled off, await DCCV tomorrow   Consultants:  Cardiology   Procedures/Surgeries: None       ASSESSMENT & PLAN:   Atrial fibrillation with rapid ventricular response, new diagnosis, POA CHA2DS2-VASc of 2 (diabetes and CHF) cardiology planning on TEE/DCCV tomorrow Metoprolol titrating for rate control <100 bmp, limited d/t soft BP --> metoprolol 12.5 mg po q6h continue Eliquis   Acute systolic congestive heart failure, new diagnosis, POA Echo showed EF of 30 to 35%  metoprolol 12.5 mg po q6h Digoxin po, cardiology adjusting as needed  Lasix 20 mg daily  Strict I&O, daily weight GDMT as blood pressure allows according to cardiologist recs Outpatient ischemic w/u    OSA on CPAP CPAP   GERD without esophagitis Continue PPI therapy   Morbid obesity  (HCC) BMI in 50s weight loss with dieting lifestyle modification    Hyperlipidemia associated with type 2 diabetes mellitus (HCC) Continue statin therapy   Type 2 diabetes mellitus with morbid obesity (HCC) Continue insulin therapy Monitor glucose level closely    Morbid obesity based on BMI: Body mass index is 53.26 kg/m.   DVT prophylaxis: Eliquis IV fluids: no continuous IV fluids  Nutrition: cardiac/carb diet  Central lines / invasive devices: none  Code Status: FULL CODE  ACP documentation reviewed: 11/21/22 and none on file in [facility]  TOC needs: TBD Barriers to dispo / significant pending items: cardioversion pending, possible for d/c after post-procedure monitoring per cardiology              Subjective / Brief ROS:  Patient reports feeling okay today no concerns Does not experience palpitations  Denies CP/SOB.  Pain controlled.  Denies new weakness.  Tolerating diet.  Reports no concerns w/ urination/defecation.   Family Communication: family at bedside on rounds      Objective Findings:  Vitals:   11/25/22 0408 11/25/22 0837 11/25/22 1135 11/25/22 1139  BP: (!) 108/91 110/87 (!) 99/38 (!) 111/94  Pulse: (!) 55 (!) 52 80 (!) 57  Resp: 18 20 20    Temp: 97.9 F (36.6 C) 98.4 F (36.9 C) 97.9 F (36.6 C)   TempSrc:  Oral Oral   SpO2: 94% 97% 97% 97%  Weight:      Height:        Intake/Output Summary (Last 24 hours) at 11/25/2022 1609 Last data filed at 11/25/2022 1139 Gross per 24 hour  Intake 1240 ml  Output 3325 ml  Net -2085 ml   Filed Weights   11/19/22 0528  Weight: (!) 149.7 kg    Examination:  Physical Exam Constitutional:      General: He is not in acute distress.    Appearance: He is obese.  Cardiovascular:     Rate and Rhythm: Normal rate and regular rhythm.  Pulmonary:     Effort: Pulmonary effort is normal.     Breath sounds: Normal breath sounds.  Musculoskeletal:     Right lower leg: Edema present.     Left  lower leg: Edema present.  Neurological:     General: No focal deficit present.     Mental Status: He is alert and oriented to person, place, and time.  Psychiatric:        Mood and Affect: Mood normal.        Behavior: Behavior normal.          Scheduled Medications:   apixaban  5 mg Oral BID   atorvastatin  80 mg Oral QHS   digoxin  0.25 mg Oral Daily   empagliflozin  10 mg Oral Daily   furosemide  20 mg Intravenous BID   gabapentin  200 mg Oral QHS   metoprolol tartrate  12.5 mg Oral Q6H   potassium chloride  20 mEq Oral Daily    Continuous Infusions:    PRN Medications:  ondansetron **OR** ondansetron (ZOFRAN) IV, mouth rinse  Antimicrobials from admission:  Anti-infectives (From admission, onward)    None           Data Reviewed:  I have personally reviewed the following...  CBC: Recent Labs  Lab 11/19/22 0533 11/20/22 0527 11/21/22 0524 11/22/22 0335  WBC 11.2* 11.7* 10.0 10.6*  NEUTROABS 8.0*  --   --   --   HGB 14.9 13.7 13.8 14.6  HCT 47.7 42.0 42.5 44.6  MCV 92.6 89.6 88.5 87.8  PLT 270 237 234 245   Basic Metabolic Panel: Recent Labs  Lab 11/19/22 0533 11/20/22 0527 11/21/22 0524 11/22/22 0849 11/23/22 0412 11/24/22 0439 11/25/22 0425  NA 140   < > 134* 136 137 136 135  K 4.0   < > 3.9 3.5 4.0 4.1 4.1  CL 106   < > 102 98 100 100 99  CO2 22   < > 23 27 26 25 25   GLUCOSE 105*   < > 94 99 99 105* 102*  BUN 23*   < > 22* 17 20 22* 26*  CREATININE 1.27*   < > 1.01 0.94 0.91 0.93 1.03  CALCIUM 8.8*   < > 8.7* 8.9 9.2 9.4 9.4  MG 1.9  --   --  2.0 2.2 2.4  --    < > = values in this interval not displayed.   GFR: Estimated Creatinine Clearance: 112.6 mL/min (by C-G formula based on SCr of 1.03 mg/dL). Liver Function Tests: Recent Labs  Lab 11/19/22 0533 11/20/22 0527  AST 26 21  ALT 20 19  ALKPHOS 83 75  BILITOT 0.7 1.8*  PROT 7.2 7.1  ALBUMIN 3.8 3.8   No results for input(s): "LIPASE", "AMYLASE" in the last 168  hours. No results for input(s): "AMMONIA" in the last 168 hours. Coagulation Profile: Recent Labs  Lab 11/19/22 0533  INR 1.3*   Cardiac Enzymes: No results for input(s): "CKTOTAL", "CKMB", "CKMBINDEX", "TROPONINI" in the last 168 hours. BNP (last 3 results) No results for input(s): "PROBNP" in the last 8760 hours. HbA1C:  No results for input(s): "HGBA1C" in the last 72 hours.  CBG: Recent Labs  Lab 11/22/22 1542 11/22/22 2111 11/23/22 0811 11/23/22 1156 11/23/22 1658  GLUCAP 103* 114* 101* 119* 88   Lipid Profile: No results for input(s): "CHOL", "HDL", "LDLCALC", "TRIG", "CHOLHDL", "LDLDIRECT" in the last 72 hours.  Thyroid Function Tests: No results for input(s): "TSH", "T4TOTAL", "FREET4", "T3FREE", "THYROIDAB" in the last 72 hours.  Anemia Panel: No results for input(s): "VITAMINB12", "FOLATE", "FERRITIN", "TIBC", "IRON", "RETICCTPCT" in the last 72 hours. Most Recent Urinalysis On File:     Component Value Date/Time   COLORURINE YELLOW (A) 07/15/2020 0528   APPEARANCEUR Clear 02/01/2021 0932   LABSPEC 1.031 (H) 07/15/2020 0528   PHURINE 5.0 07/15/2020 0528   GLUCOSEU Negative 02/01/2021 0932   HGBUR NEGATIVE 07/15/2020 0528   BILIRUBINUR Negative 02/01/2021 0932   KETONESUR NEGATIVE 07/15/2020 0528   PROTEINUR Negative 02/01/2021 0932   PROTEINUR 30 (A) 07/15/2020 0528   UROBILINOGEN 0.2 04/15/2020 1037   NITRITE Negative 02/01/2021 0932   NITRITE NEGATIVE 07/15/2020 0528   LEUKOCYTESUR Negative 02/01/2021 0932   LEUKOCYTESUR NEGATIVE 07/15/2020 0528   Sepsis Labs: @LABRCNTIP (procalcitonin:4,lacticidven:4) Microbiology: No results found for this or any previous visit (from the past 240 hour(s)).    Radiology Studies last 3 days: No results found.        Sunnie Nielsen, DO [facility] 11/25/2022, 4:09 PM    Dictation software may have been used to generate the above note. Typos may occur and escape review in typed/dictated  notes. Please contact Dr Lyn Hollingshead directly for clarity if needed.  Staff may message me via secure chat in Epic  but this may not receive an immediate response,  please page me for urgent matters!  If 7PM-7AM, please contact night coverage [email or web address]

## 2022-11-26 ENCOUNTER — Encounter
Admission: EM | Disposition: A | Discharge status: Home / Self Care | Payer: Self-pay | Source: Home / Self Care | Attending: Osteopathic Medicine

## 2022-11-26 ENCOUNTER — Inpatient Hospital Stay: Payer: 59 | Admitting: Anesthesiology

## 2022-11-26 ENCOUNTER — Inpatient Hospital Stay (HOSPITAL_COMMUNITY)
Admit: 2022-11-26 | Discharge: 2022-11-26 | Disposition: A | Discharge status: Home / Self Care | Payer: 59 | Attending: Cardiology | Admitting: Cardiology

## 2022-11-26 ENCOUNTER — Encounter: Payer: Self-pay | Admitting: Internal Medicine

## 2022-11-26 ENCOUNTER — Other Ambulatory Visit: Payer: Self-pay

## 2022-11-26 DIAGNOSIS — I5021 Acute systolic (congestive) heart failure: Secondary | ICD-10-CM | POA: Diagnosis not present

## 2022-11-26 DIAGNOSIS — I340 Nonrheumatic mitral (valve) insufficiency: Secondary | ICD-10-CM

## 2022-11-26 DIAGNOSIS — I4891 Unspecified atrial fibrillation: Secondary | ICD-10-CM

## 2022-11-26 HISTORY — PX: TEE WITHOUT CARDIOVERSION: SHX5443

## 2022-11-26 HISTORY — PX: CARDIOVERSION: SHX1299

## 2022-11-26 LAB — BASIC METABOLIC PANEL
Anion gap: 13 (ref 5–15)
BUN: 33 mg/dL — ABNORMAL HIGH (ref 6–20)
CO2: 24 mmol/L (ref 22–32)
Calcium: 9.6 mg/dL (ref 8.9–10.3)
Chloride: 99 mmol/L (ref 98–111)
Creatinine, Ser: 1.09 mg/dL (ref 0.61–1.24)
GFR, Estimated: >60 mL/min (ref >60)
Glucose, Bld: 99 mg/dL (ref 70–99)
Potassium: 4 mmol/L (ref 3.5–5.1)
Sodium: 136 mmol/L (ref 135–145)

## 2022-11-26 LAB — GLUCOSE, CAPILLARY: Glucose-Capillary: 98 mg/dL (ref 70–99)

## 2022-11-26 SURGERY — TRANSESOPHAGEAL ECHOCARDIOGRAM (TEE)
Anesthesia: General

## 2022-11-26 MED ORDER — SODIUM CHLORIDE 0.9% FLUSH
INTRAVENOUS | Status: DC | PRN
  Administered 2022-11-26 (×4): 10 mL via INTRAVENOUS

## 2022-11-26 MED ORDER — LIDOCAINE VISCOUS HCL 2 % MT SOLN
OROMUCOSAL | Status: AC
  Administered 2022-11-26: 10 mL
  Filled 2022-11-26: qty 15

## 2022-11-26 MED ORDER — AMIODARONE HCL IN DEXTROSE 360-4.14 MG/200ML-% IV SOLN
60.0000 mg/h | INTRAVENOUS | Status: DC
  Administered 2022-11-26 – 2022-11-27 (×4): 30 mg/h via INTRAVENOUS
  Administered 2022-11-28 – 2022-11-30 (×7): 60 mg/h via INTRAVENOUS
  Filled 2022-11-26 (×11): qty 200

## 2022-11-26 MED ORDER — BUTAMBEN-TETRACAINE-BENZOCAINE 2-2-14 % EX AERO
INHALATION_SPRAY | CUTANEOUS | Status: AC
  Filled 2022-11-26: qty 5

## 2022-11-26 MED ORDER — PROPOFOL 10 MG/ML IV BOLUS
INTRAVENOUS | Status: AC
  Filled 2022-11-26: qty 40

## 2022-11-26 MED ORDER — PROPOFOL 10 MG/ML IV BOLUS
INTRAVENOUS | Status: DC | PRN
  Administered 2022-11-26 (×3): 20 mg via INTRAVENOUS
  Administered 2022-11-26: 80 mg via INTRAVENOUS
  Administered 2022-11-26: 10 mg via INTRAVENOUS

## 2022-11-26 MED ORDER — AMIODARONE LOAD VIA INFUSION
150.0000 mg | Freq: Once | INTRAVENOUS | Status: AC
  Administered 2022-11-26: 150 mg via INTRAVENOUS
  Filled 2022-11-26: qty 83.34

## 2022-11-26 MED ORDER — AMIODARONE HCL IN DEXTROSE 360-4.14 MG/200ML-% IV SOLN
60.0000 mg/h | INTRAVENOUS | Status: DC
  Administered 2022-11-26: 60 mg/h via INTRAVENOUS
  Filled 2022-11-26: qty 200

## 2022-11-26 NOTE — Progress Notes (Signed)
PROGRESS NOTE    Casey Velez   ZOX:[id number] DOB: 02-15-1965  DOA: 11/19/2022 Date of Service: 11/26/22 which is hospital day 7  PCP: Marjie Skiff, NP    HPI: Casey Velez is a 57 y.o. male with medical history significant of morbid obesity, type 2 diabetes, hyperlipidemia presenting with SOB x1 month with exertion and productive cough - states nothing got worse for him to come in to ED but wants to get checked out  Hospital course / significant events:  10/14: to ED, admitted to hospitalist service for Afib RVR, suspected CHF, started on diltiazem and heparin, cardiology consulted 10/15: Echocardiogram yesterday evening (+)LVEF 30-35% with global hypokinesis, mild LVH. RV mildly dilated with moderately reduced contraction and mod Pulm HTN (RVSP 50 mmHg). There is mild to moderate tricuspid and mild mitral regurgitation. CVP severely elevated. Planning ischemia eval outpatient. Plan DCCV when diuresed  10/16 -10/19: continue diuresis. Plan DCCV Monday 10/21. Net IO Since Admission: -14,865.77 mL [11/24/22 0907]. 10/19 UOP slowing somewhat  10/19: tachycardic but no symptoms  10/20: HR improved, UOP has leveled off, await DCCV tomorrow  10/21: TEE/DCCV today, still in afib, continue on IV amiodarone and consider repeat cardioversion   Consultants:  Cardiology   Procedures/Surgeries: 11/26/22 TEE, DC cardioversion - remains in Afib      ASSESSMENT & PLAN:   Atrial fibrillation with rapid ventricular response, new diagnosis, POA CHA2DS2-VASc of 2 (diabetes and CHF) cardiology planning on TEE/DCCV tomorrow Metoprolol titrating for rate control <100 bmp, limited d/t soft BP --> metoprolol 12.5 mg po q6h continue Eliquis TEE/DCCV today, still in afib --> continue on IV amiodarone and consider repeat cardioversion    Acute systolic congestive heart failure, new diagnosis, POA Echo showed EF of 30 to 35%  metoprolol 12.5 mg po q6h Digoxin po, cardiology adjusting as  needed  Lasix 20 mg daily  Strict I&O, daily weight GDMT as blood pressure allows according to cardiologist recs Outpatient ischemic w/u    OSA on CPAP CPAP   GERD without esophagitis Continue PPI therapy   Morbid obesity (HCC) BMI in 50s weight loss with dieting lifestyle modification    Hyperlipidemia associated with type 2 diabetes mellitus (HCC) Continue statin therapy   Type 2 diabetes mellitus with morbid obesity (HCC) Continue insulin therapy Monitor glucose level closely    Morbid obesity based on BMI: Body mass index is 53.26 kg/m.   DVT prophylaxis: Eliquis IV fluids: no continuous IV fluids  Nutrition: cardiac/carb diet  Central lines / invasive devices: none  Code Status: FULL CODE  ACP documentation reviewed: 11/21/22 and none on file in [facility]  TOC needs: TBD Barriers to dispo / significant pending items: on amiodarone, possible repeat cardioversion pending             Subjective / Brief ROS:  Patient reports feeling okay today no concerns Seen following cardioversion  Denies CP/SOB.  Pain controlled.  Denies new weakness.  Tolerating diet.  Reports no concerns w/ urination/defecation.   Family Communication: family at bedside on rounds      Objective Findings:  Vitals:   11/26/22 1334 11/26/22 1335 11/26/22 1345 11/26/22 1400  BP:  97/86 90/73 109/84  Pulse: 74 91 100 (!) 118  Resp: (!) 45 (!) 22 14 11   Temp:      TempSrc:      SpO2: 100% 97% 99% 96%  Weight:      Height:        Intake/Output Summary (Last 24  hours) at 11/26/2022 1604 Last data filed at 11/25/2022 2138 Gross per 24 hour  Intake --  Output 1050 ml  Net -1050 ml   Filed Weights   11/19/22 0528 11/26/22 0804  Weight: (!) 149.7 kg 129.7 kg    Examination:  Physical Exam Constitutional:      General: He is not in acute distress.    Appearance: He is obese.  Cardiovascular:     Rate and Rhythm: Normal rate. Rhythm irregular.  Pulmonary:      Effort: Pulmonary effort is normal.  Neurological:     General: No focal deficit present.     Mental Status: He is alert and oriented to person, place, and time.  Psychiatric:        Mood and Affect: Mood normal.        Behavior: Behavior normal.          Scheduled Medications:   apixaban  5 mg Oral BID   atorvastatin  80 mg Oral QHS   digoxin  0.25 mg Oral Daily   empagliflozin  10 mg Oral Daily   furosemide  20 mg Intravenous BID   gabapentin  200 mg Oral QHS   metoprolol tartrate  12.5 mg Oral Q6H   potassium chloride  20 mEq Oral Daily    Continuous Infusions:  amiodarone 60 mg/hr (11/26/22 1517)   Followed by   amiodarone       PRN Medications:  ondansetron **OR** ondansetron (ZOFRAN) IV, mouth rinse  Antimicrobials from admission:  Anti-infectives (From admission, onward)    None           Data Reviewed:  I have personally reviewed the following...  CBC: Recent Labs  Lab 11/20/22 0527 11/21/22 0524 11/22/22 0335  WBC 11.7* 10.0 10.6*  HGB 13.7 13.8 14.6  HCT 42.0 42.5 44.6  MCV 89.6 88.5 87.8  PLT 237 234 245   Basic Metabolic Panel: Recent Labs  Lab 11/22/22 0849 11/23/22 0412 11/24/22 0439 11/25/22 0425 11/26/22 0412  NA 136 137 136 135 136  K 3.5 4.0 4.1 4.1 4.0  CL 98 100 100 99 99  CO2 27 26 25 25 24   GLUCOSE 99 99 105* 102* 99  BUN 17 20 22* 26* 33*  CREATININE 0.94 0.91 0.93 1.03 1.09  CALCIUM 8.9 9.2 9.4 9.4 9.6  MG 2.0 2.2 2.4  --   --    GFR: Estimated Creatinine Clearance: 97.7 mL/min (by C-G formula based on SCr of 1.09 mg/dL). Liver Function Tests: Recent Labs  Lab 11/20/22 0527  AST 21  ALT 19  ALKPHOS 75  BILITOT 1.8*  PROT 7.1  ALBUMIN 3.8   No results for input(s): "LIPASE", "AMYLASE" in the last 168 hours. No results for input(s): "AMMONIA" in the last 168 hours. Coagulation Profile: No results for input(s): "INR", "PROTIME" in the last 168 hours.  Cardiac Enzymes: No results for input(s):  "CKTOTAL", "CKMB", "CKMBINDEX", "TROPONINI" in the last 168 hours. BNP (last 3 results) No results for input(s): "PROBNP" in the last 8760 hours. HbA1C: No results for input(s): "HGBA1C" in the last 72 hours.  CBG: Recent Labs  Lab 11/22/22 2111 11/23/22 0811 11/23/22 1156 11/23/22 1658 11/26/22 1147  GLUCAP 114* 101* 119* 88 98   Lipid Profile: No results for input(s): "CHOL", "HDL", "LDLCALC", "TRIG", "CHOLHDL", "LDLDIRECT" in the last 72 hours.  Thyroid Function Tests: No results for input(s): "TSH", "T4TOTAL", "FREET4", "T3FREE", "THYROIDAB" in the last 72 hours.  Anemia Panel: No results for input(s): "  VITAMINB12", "FOLATE", "FERRITIN", "TIBC", "IRON", "RETICCTPCT" in the last 72 hours. Most Recent Urinalysis On File:     Component Value Date/Time   COLORURINE YELLOW (A) 07/15/2020 0528   APPEARANCEUR Clear 02/01/2021 0932   LABSPEC 1.031 (H) 07/15/2020 0528   PHURINE 5.0 07/15/2020 0528   GLUCOSEU Negative 02/01/2021 0932   HGBUR NEGATIVE 07/15/2020 0528   BILIRUBINUR Negative 02/01/2021 0932   KETONESUR NEGATIVE 07/15/2020 0528   PROTEINUR Negative 02/01/2021 0932   PROTEINUR 30 (A) 07/15/2020 0528   UROBILINOGEN 0.2 04/15/2020 1037   NITRITE Negative 02/01/2021 0932   NITRITE NEGATIVE 07/15/2020 0528   LEUKOCYTESUR Negative 02/01/2021 0932   LEUKOCYTESUR NEGATIVE 07/15/2020 0528   Sepsis Labs: @LABRCNTIP (procalcitonin:4,lacticidven:4) Microbiology: No results found for this or any previous visit (from the past 240 hour(s)).    Radiology Studies last 3 days: No results found.        Sunnie Nielsen, DO [facility] 11/26/2022, 4:04 PM    Dictation software may have been used to generate the above note. Typos may occur and escape review in typed/dictated notes. Please contact Dr Lyn Hollingshead directly for clarity if needed.  Staff may message me via secure chat in Epic  but this may not receive an immediate response,  please page me for  urgent matters!  If 7PM-7AM, please contact night coverage [email or web address]

## 2022-11-26 NOTE — Transfer of Care (Signed)
Immediate Anesthesia Transfer of Care Note  [name]  Procedure(s) Performed: TRANSESOPHAGEAL ECHOCARDIOGRAM CARDIOVERSION  Patient Location: PACU and Nursing Unit  Anesthesia Type:General  Level of Consciousness: drowsy and patient cooperative  Airway & Oxygen Therapy: Patient Spontanous Breathing and Patient connected to nasal cannula oxygen  Post-op Assessment: Report given to RN and Post -op Vital signs reviewed and stable  Post vital signs: Reviewed and stable  Last Vitals:  Vitals Value Taken Time  BP 97/86 11/26/22 1335  Temp    Pulse 83 11/26/22 1335  Resp 20 11/26/22 1335  SpO2 97 % 11/26/22 1335  Vitals shown include unfiled device data.  Last Pain:  Vitals:   11/26/22 1143  TempSrc: Oral  PainSc: 0-No pain      Patients Stated Pain Goal: 0 (11/25/22 2000)  Complications: No notable events documented.

## 2022-11-26 NOTE — Procedures (Signed)
Transesophageal Echocardiogram and DC cardioversion: Indication: persistent atrial fibrillation  Consent: Risks of procedure as well as the alternatives and risks of each were explained to the (patient/caregiver).  Consent for procedure obtained.  Time Out: Verified patient identification, verified procedure, site/side was marked, verified correct patient position, special equipment/implants available, medications/allergies/relevent history reviewed, required imaging and test results available.  Performed  Procedure: 10 ml of viscous lidocaine were given orally to provide local anesthesia to the oropharynx. The patient was positioned supine on the left side, bite block provided. The patient was sedated per anesthesia team.  Using digital technique an omniplane probe was advanced into the esophagus without incident.   See report in EPIC  for complete details: In brief, imaging revealed moderately reduced LV systolic function.  Estimated ejection fraction was 30-35%.    Imaging of the septum showed no ASD or VSD Bubble study was negative for shunt 2D and color flow confirmed no PFO  The LA was well visualized in orthogonal views.  There was no thrombus in the LA and LA appendage   The descending thoracic aorta had no  mural aortic debris with no evidence of aneurysmal dilation or dissection  Cardioversion procedure note For atrial fibrillation.  Procedure Details:  Patient placed on cardiac monitor, pulse oximetry, supplemental oxygen as necessary.   Sedation given: propofol IV per anesthesia team Pacer pads placed anterior and posterior chest.  Cardioverted 3 time(s).   Cardioverted at  200J. Synchronized biphasic Brief episode of sinus rhythm (5-10seconds) noted after each cardioversion attempt Unsuccessful cardioversion  Evaluation: Findings: Post procedure EKG shows: atrial fibrillation Complications: None Patient did tolerate procedure well.  Recommendations -start  amiodarone drip. Cont eliquis -consider repeat cardioversion after iv amiodarone -manage obesity and OSA- both contributing to failed DCCV attempt.  Time Spent Directly with the Patient:  35 minutes   Debbe Odea, M.D.    Arlys John Agbor-Etang 11/26/2022 1:28 PM

## 2022-11-26 NOTE — Plan of Care (Signed)

## 2022-11-26 NOTE — Progress Notes (Signed)
Cardiology Progress Note   Patient Name: Casey Velez Date of Encounter: 11/26/2022  Primary Cardiologist: Lorine Bears, MD  Subjective   Feels well this AM.  Dyspnea and abd girth/volume overload markedly improved.  NPO for TEE/DCCV.  Mother @ bedside.  Questions answered. Objective   Inpatient Medications    Scheduled Meds:  apixaban  5 mg Oral BID   atorvastatin  80 mg Oral QHS   digoxin  0.25 mg Oral Daily   empagliflozin  10 mg Oral Daily   furosemide  20 mg Intravenous BID   gabapentin  200 mg Oral QHS   metoprolol tartrate  12.5 mg Oral Q6H   potassium chloride  20 mEq Oral Daily   Continuous Infusions:  PRN Meds: ondansetron **OR** ondansetron (ZOFRAN) IV, mouth rinse   Vital Signs    Vitals:   11/26/22 0015 11/26/22 0407 11/26/22 0804 11/26/22 1111  BP: 104/73 96/74 106/88 106/86  Pulse: 60 (!) 54 70 (!) 56  Resp: 18 18 20 20   Temp:  97.9 F (36.6 C) 97.6 F (36.4 C) 97.6 F (36.4 C)  TempSrc:   Oral Oral  SpO2: 95% 96% 98% 99%  Weight:   129.7 kg   Height:        Intake/Output Summary (Last 24 hours) at 11/26/2022 1121 Last data filed at 11/25/2022 2138 Gross per 24 hour  Intake --  Output 1250 ml  Net -1250 ml   Filed Weights   11/19/22 0528 11/26/22 0804  Weight: (!) 149.7 kg 129.7 kg    Physical Exam   GEN: Well nourished, well developed, in no acute distress.  HEENT: Grossly normal.  Neck: Supple, no JVD, carotid bruits, or masses. Cardiac: IR, IR, distant, no murmurs, rubs, or gallops. No clubbing, cyanosis, edema.  Radials 2+, DP/PT 2+ and equal bilaterally.  Respiratory:  Respirations regular and unlabored, clear to auscultation bilaterally. GI: Soft, nontender, nondistended, BS + x 4. MS: no deformity or atrophy. Skin: warm and dry, no rash. Neuro:  Strength and sensation are intact. Psych: AAOx3.  Normal affect.  Labs    Chemistry Recent Labs  Lab 11/20/22 0527 11/21/22 0524 11/24/22 0439 11/25/22 0425  11/26/22 0412  NA 136   < > 136 135 136  K 3.7   < > 4.1 4.1 4.0  CL 103   < > 100 99 99  CO2 22   < > 25 25 24   GLUCOSE 106*   < > 105* 102* 99  BUN 18   < > 22* 26* 33*  CREATININE 0.91   < > 0.93 1.03 1.09  CALCIUM 8.8*   < > 9.4 9.4 9.6  PROT 7.1  --   --   --   --   ALBUMIN 3.8  --   --   --   --   AST 21  --   --   --   --   ALT 19  --   --   --   --   ALKPHOS 75  --   --   --   --   BILITOT 1.8*  --   --   --   --   GFRNONAA >60   < > >60 >60 >60  ANIONGAP 11   < > 11 11 13    < > = values in this interval not displayed.     Hematology Recent Labs  Lab 11/20/22 0527 11/21/22 0524 11/22/22 0335  WBC 11.7* 10.0 10.6*  RBC  4.69 4.80 5.08  HGB 13.7 13.8 14.6  HCT 42.0 42.5 44.6  MCV 89.6 88.5 87.8  MCH 29.2 28.8 28.7  MCHC 32.6 32.5 32.7  RDW 14.1 14.0 13.6  PLT 237 234 245    Cardiac Enzymes  Recent Labs  Lab 11/19/22 0533 11/19/22 0734  TROPONINIHS 8 9      BNP    Component Value Date/Time   BNP 334.5 (H) 11/19/2022 0533   Lipids  Lab Results  Component Value Date   CHOL 56 11/19/2022   HDL 18 (L) 11/19/2022   LDLCALC 33 11/19/2022   TRIG 25 11/19/2022   CHOLHDL 3.1 11/19/2022    HbA1c  Lab Results  Component Value Date   HGBA1C 6.2 (H) 11/19/2022    Radiology    No results found.   Telemetry    Afib, 90's to 150's - Personally Reviewed  Cardiac Studies   2D Echocardiogram 10.14.2024  1. Left ventricular ejection fraction, by estimation, is 30 to 35%. The  left ventricle has moderate to severely decreased function. The left  ventricle demonstrates global hypokinesis. The left ventricular internal  cavity size was moderately dilated.  There is mild left ventricular hypertrophy. Left ventricular diastolic  function could not be evaluated.   2. Right ventricular systolic function is moderately reduced. The right  ventricular size is mildly enlarged. There is moderately elevated  pulmonary artery systolic pressure.   3. Left  atrial size was mildly dilated.   4. A small pericardial effusion is present.   5. The mitral valve is normal in structure. Mild mitral valve  regurgitation.   6. Tricuspid valve regurgitation is mild to moderate.   7. The aortic valve is tricuspid. Aortic valve regurgitation is not  visualized. No aortic stenosis is present.   8. There is mild dilatation of the ascending aorta, measuring 42 mm.   9. The inferior vena cava is dilated in size with <50% respiratory  variability, suggesting right atrial pressure of 15 mmHg.  Patient Profile     57 y.o. male w/ a h/o DM, HL, obesity, and OSA on CPAP, who was admitted 101/4 w/ Afib RVR, CHF, and newly dx dilated cardiomyopathy.  Assessment & Plan    1.  Afib w/ RVR:  Admitted 10/14 w/ Afib and CHF.  Rates difficult to control - 90's to 150's.  AVN blocking agent dosing limited by soft BPs.  Cont low-dose ? blocker and digoxin.  Cont eliquis (CHA2DS2VASc = 2).  TEE/DCCV today.  Risks/benefits discussed and pt is willing to proceed.  Eventual outpt EP eval.  Cont CPAP for OSA.  2.  Dilated Cardiomyopathy/Tachy-induced cardiomyopathy/Acute HFrEF:  In setting of above, EF 30-35% w/ glob HK.   He has responded well to IV diuresis and feels significantly better.  Minus 1.5L yesterday, though intake not recorded.  Listed as minus 18 L for admission, but again, intakes inaccurate.  If accurate, wt down 20 kg since admission (he believes this is accurate).  Exam challenging due to body habitus, but does not appear to be markedly volume overloaded.  BUN/creat cont to rise slowly, 33/1.09 this AM.  Bicarb nl @ 24.  Cont IV lasix today.  Pressures remain soft, limiting GDMT (no acei/arb/arni/mra). Plan to consolidate ? blocker pending HR after DCCV.  SGLT2i currently on hold.  3.  DMII:  A1c 6.2.  On jardiance (held for DCCV/anesthesia).  4.  OSA:  uses CPAP.  5.  Dilated Asc Ao:  42 mm on echo.  Can plan CTA as outpt to better eval and establish  baseline.  Signed, Nicolasa Ducking, NP  11/26/2022, 11:21 AM    For questions or updates, please contact   Please consult [email or web address] for contact info under Cardiology/STEMI.

## 2022-11-26 NOTE — Anesthesia Preprocedure Evaluation (Signed)
Anesthesia Evaluation  Patient identified by MRN, date of birth, ID band Patient awake    Reviewed: Allergy & Precautions, NPO status , Patient's Chart, lab work & pertinent test results  History of Anesthesia Complications Negative for: history of anesthetic complications  Airway Mallampati: III  TM Distance: <3 FB Neck ROM: full    Dental  (+) Chipped, Poor Dentition, Missing, Loose   Pulmonary neg shortness of breath, sleep apnea    Pulmonary exam normal        Cardiovascular Exercise Tolerance: Good +CHF and + DOE  + dysrhythmias Atrial Fibrillation  Rhythm:irregular Rate:Tachycardia     Neuro/Psych negative neurological ROS  negative psych ROS   GI/Hepatic Neg liver ROS,GERD  Controlled,,  Endo/Other  diabetes, Type 2    Renal/GU negative Renal ROS  negative genitourinary   Musculoskeletal   Abdominal   Peds  Hematology negative hematology ROS (+)   Anesthesia Other Findings Past Medical History: No date: Acquired dilation of ascending aorta and aortic root (HCC)     Comment:  a. 11/2022 Echo: Asc Ao 42mm. No date: Allergy No date: Arthritis No date: Cardiomyopathy [facility])     Comment:  a. 11/2022 Echo (in setting of Afib): EF 30-35%, glob               HK, mod dil LV, mod reduced RV fxn, mild MR, mild to mod               TR, Asc Ao 42mm. No date: Colon polyps No date: Diabetes mellitus, type 2 (HCC) 04/15/2020: Fatigue No date: GERD (gastroesophageal reflux disease) No date: HFrEF (heart failure with reduced ejection fraction) (HCC)     Comment:  a. 11/2022 Echo (in setting of Afib): EF 30-35%. No date: Hydrocele, left 05/23/2020: Hyperlipidemia No date: Morbid obesity (HCC) No date: Persistent atrial fibrillation (HCC)     Comment:  a. 11/2022-->CHA2DS2VASc = 2-->eliquis. 04/15/2020: Screening for malignant neoplasm of prostate No date: Sleep apnea No date: Vitamin D deficiency  Past Surgical  History: No date: APPENDECTOMY 04/26/2017: COLONOSCOPY WITH PROPOFOL 05/27/2020: COLONOSCOPY WITH PROPOFOL; N/A     Comment:  Procedure: COLONOSCOPY WITH PROPOFOL;  Surgeon:               Pasty Spillers, MD;  Location: [facility] ENDOSCOPY;                Service: Endoscopy;  Laterality: N/A; 04/06/2021: HYDROCELE EXCISION; Left     Comment:  Procedure: HYDROCELECTOMY ADULT;  Surgeon: Orson Ape, MD;  Location: [facility] ORS;  Service: Urology;                Laterality: Left;  BMI    Body Mass Index: 46.16 kg/m      Reproductive/Obstetrics negative OB ROS                             Anesthesia Physical Anesthesia Plan  ASA: 4  Anesthesia Plan: General   Post-op Pain Management:    Induction: Intravenous  PONV Risk Score and Plan: Propofol infusion and TIVA  Airway Management Planned: Natural Airway and Nasal Cannula  Additional Equipment:   Intra-op Plan:   Post-operative Plan:   Informed Consent: I have reviewed the patients History and Physical, chart, labs and discussed the procedure including the risks, benefits and alternatives for the proposed anesthesia with the  patient or authorized representative who has indicated his/her understanding and acceptance.     Dental Advisory Given  Plan Discussed with: Anesthesiologist, CRNA and Surgeon  Anesthesia Plan Comments: (Patient consented for risks of anesthesia including but not limited to:  - adverse reactions to medications - risk of airway placement if required - damage to eyes, teeth, lips or other oral mucosa - nerve damage due to positioning  - sore throat or hoarseness - Damage to heart, brain, nerves, lungs, other parts of body or loss of life  Patient voiced understanding and assent.)       Anesthesia Quick Evaluation

## 2022-11-26 NOTE — Progress Notes (Signed)
[facility] HF Stewardship  PCP: Marjie Skiff, NP  PCP-Cardiologist: None  HPI: Casey Velez is a 57 y.o. male with morbid obesity, type 2 diabetes (A1c 6.2), hyperlipidemia presenting with A-fib with RVR  who presented with worsening shortness of breath over 1 month. Troponin was negative on admission. BNP elevated to 334.5. CXR showed enlarged cardiac silhouette. TTE on 11/19/22 showed LVEF of 30-35%, moderately reduced RV, small pericardial effusion, mild MR, mild-moderate TR. Cardiology planning TEE guided DCCV today.  Pertinent cardiac history: No significant cardiac history prior to admission.  Pertinent Lab Values: Creatinine, Ser  Date Value Ref Range Status  11/26/2022 1.09 0.61 - 1.24 mg/dL Final   BUN  Date Value Ref Range Status  11/26/2022 33 (H) 6 - 20 mg/dL Final  16/11/9602 12 6 - 24 mg/dL Final   Potassium  Date Value Ref Range Status  11/26/2022 4.0 3.5 - 5.1 mmol/L Final   Sodium  Date Value Ref Range Status  11/26/2022 136 135 - 145 mmol/L Final  09/19/2022 141 134 - 144 mmol/L Final   B Natriuretic Peptide  Date Value Ref Range Status  11/19/2022 334.5 (H) 0.0 - 100.0 pg/mL Final    Comment:    Performed at [facility], [address] [place], [place] [address]   Magnesium  Date Value Ref Range Status  11/24/2022 2.4 1.7 - 2.4 mg/dL Final    Comment:    Performed at [facility], [address] [place], [place] [address]   HB A1C ([facility])  Date Value Ref Range Status  09/19/2022 6.4 (H) 4.8 - 5.6 % Final    Comment:             Prediabetes: 5.7 - 6.4          Diabetes: >6.4          Glycemic control for adults with diabetes: <7.0    Hgb A1c MFr Bld  Date Value Ref Range Status  11/19/2022 6.2 (H) 4.8 - 5.6 % Final    Comment:    (NOTE) Pre diabetes:          5.7%-6.4%  Diabetes:              >6.4%  Glycemic control for   <7.0% adults with diabetes    Digoxin Level  Date Value Ref Range Status   11/24/2022 0.2 (L) 0.8 - 2.0 ng/mL Final    Comment:    Performed at [facility], [address] [place], [place] [address]   TSH  Date Value Ref Range Status  11/19/2022 2.290 0.350 - 4.500 uIU/mL Final    Comment:    Performed by a 3rd Generation assay with a functional sensitivity of <=0.01 uIU/mL. Performed at [facility], [address] [place], [place] [address]   03/21/2022 2.410 0.450 - 4.500 uIU/mL Final    Vital Signs: Temp:  [97.6 F (36.4 C)-98.7 F (37.1 C)] 97.6 F (36.4 C) (10/21 0804) Pulse Rate:  [54-93] 70 (10/21 0804) Cardiac Rhythm: Atrial fibrillation (10/21 0700) Resp:  [18-27] 20 (10/21 0804) BP: (96-127)/(38-94) 106/88 (10/21 0804) SpO2:  [94 %-98 %] 98 % (10/21 0804)   Intake/Output Summary (Last 24 hours) at 11/26/2022 0919 Last data filed at 11/25/2022 2138 Gross per 24 hour  Intake --  Output 1250 ml  Net -1250 ml    Current Inpatient Medications:  -Metoprolol tartrate 12.5 mg q6h -Jardiance 10 mg daily -Digoxin 0.25 mg daily -Furosemide 20  mg IV BID  Prior to admission HF Medications:  -Jardiance 10 mg daily  Assessment: 1. Acute heart failure (LVEF 30-35%), due to unknown etiology, most likely NICM from AF. NYHA class II-III symptoms.  -Symptoms: Shortness of breath, LEE, and orthopnea are significantly improved.  -Volume: Appears nearly euvolemic today. BUN trending up and creatinine is stable. Net negative 17L on admission. Recommend swapping to oral diuretics tomorrow. -Hemodynamics: BP stable in 100-120s/70s and HR is 70s after starting digoxin (recent level 0.2). Remains in AF. -BB: Metoprolol tartrate 12.5 mg every 6 hours for rate control. Can consolidate to metoprolol succinate prior to discharge. Will likely need to reduce dose after DCCV. -ACEI/ARB/ARNI: None at this time, can consider prior to discharge pending BP. -MRA: Potassium now 4. Would benefit from spironolactone to maintain normokalemia,  augment diureisis, and for HFrEF.  -SGLT2i: Continue Jardiance 10 mg daily -Cardiology planning TEE guided DCCV when euvolemic. -Digoxin started, trough of 0.2.  Plan: 1) Medication changes recommended at this time: -Consider adding spironolactone 12.5 mg daily tomorrow after cardioversion.  2) Patient assistance: -Entresto requires Prior Authorization, Eliquis copay is $50.00, Xarelto copay is $50.00, [place] Not covered, Jardiance copay is $50.00  -Eligible for copay cards  3) Education: - Patient has been educated on current HF medications and potential additions to HF medication regimen - Patient verbalizes understanding that over the next few months, these medication doses may change and more medications may be added to optimize HF regimen - Patient has been educated on basic disease state pathophysiology and goals of therapy  Medication Assistance / Insurance Benefits Check:  Does the patient have prescription insurance?    Type of insurance plan:   Does the patient qualify for medication assistance through manufacturers or grants? No   Outpatient Pharmacy:  Prior to admission outpatient pharmacy: [facility]   Is the patient willing to utilize a [facility] pharmacy at discharge?: Yes  Please do not hesitate to reach out with questions or concerns,  Enos Fling, PharmD, CPP, BCPS Heart Failure Pharmacist  Phone - [phone] 11/26/2022 9:19 AM

## 2022-11-27 ENCOUNTER — Encounter: Payer: Self-pay | Admitting: Cardiology

## 2022-11-27 DIAGNOSIS — I4891 Unspecified atrial fibrillation: Secondary | ICD-10-CM | POA: Diagnosis not present

## 2022-11-27 DIAGNOSIS — I5021 Acute systolic (congestive) heart failure: Secondary | ICD-10-CM | POA: Diagnosis not present

## 2022-11-27 LAB — BASIC METABOLIC PANEL
Anion gap: 12 (ref 5–15)
BUN: 35 mg/dL — ABNORMAL HIGH (ref 6–20)
CO2: 25 mmol/L (ref 22–32)
Calcium: 9.3 mg/dL (ref 8.9–10.3)
Chloride: 99 mmol/L (ref 98–111)
Creatinine, Ser: 1.22 mg/dL (ref 0.61–1.24)
GFR, Estimated: >60 mL/min (ref >60)
Glucose, Bld: 129 mg/dL — ABNORMAL HIGH (ref 70–99)
Potassium: 3.8 mmol/L (ref 3.5–5.1)
Sodium: 136 mmol/L (ref 135–145)

## 2022-11-27 LAB — ECHO TEE: Est EF: 30

## 2022-11-27 NOTE — Progress Notes (Signed)
Cardiology Progress Note   Patient Name: Casey Velez Date of Encounter: 11/27/2022  Primary Cardiologist: Lorine Bears, MD  Subjective   Unsuccessful cardioversion yesterday.  Feels well this morning.  Remains in atrial fibrillation with heart rates mostly in the 90s to low 100s but as fast as 140s.  Currently asymptomatic.  Tolerating IV amiodarone. Objective   Inpatient Medications    Scheduled Meds:  apixaban  5 mg Oral BID   atorvastatin  80 mg Oral QHS   digoxin  0.25 mg Oral Daily   empagliflozin  10 mg Oral Daily   furosemide  20 mg Intravenous BID   gabapentin  200 mg Oral QHS   metoprolol tartrate  12.5 mg Oral Q6H   potassium chloride  20 mEq Oral Daily   Continuous Infusions:  amiodarone 30 mg/hr (11/27/22 1000)   PRN Meds: ondansetron **OR** ondansetron (ZOFRAN) IV, mouth rinse   Vital Signs    Vitals:   11/27/22 1307 11/27/22 1308 11/27/22 1309 11/27/22 1310  BP:      Pulse: (!) 114 65 66 (!) 59  Resp: 16 (!) 33 (!) 23 (!) 21  Temp:      TempSrc:      SpO2: 97% 98% 98% 97%  Weight:      Height:        Intake/Output Summary (Last 24 hours) at 11/27/2022 1313 Last data filed at 11/27/2022 1000 Gross per 24 hour  Intake 384.9 ml  Output 1350 ml  Net -965.1 ml   Filed Weights   11/19/22 0528 11/26/22 0804  Weight: (!) 149.7 kg 129.7 kg    Physical Exam   GEN: Well nourished, well developed, in no acute distress.  HEENT: Grossly normal.  Neck: Supple, obese, difficult to gauge JVP.  No carotid bruits or masses. Cardiac: IR, IR, distant, no murmurs, rubs, or gallops. No clubbing, cyanosis, edema.  Radials 2+, DP/PT 2+ and equal bilaterally.  Respiratory:  Respirations regular and unlabored, clear to auscultation bilaterally. GI: Soft, nontender, nondistended, BS + x 4. MS: no deformity or atrophy. Skin: warm and dry, no rash. Neuro:  Strength and sensation are intact. Psych: AAOx3.  Normal affect.  Labs    Chemistry Recent Labs   Lab 11/25/22 0425 11/26/22 0412 11/27/22 1017  NA 135 136 136  K 4.1 4.0 3.8  CL 99 99 99  CO2 25 24 25   GLUCOSE 102* 99 129*  BUN 26* 33* 35*  CREATININE 1.03 1.09 1.22  CALCIUM 9.4 9.6 9.3  GFRNONAA >60 >60 >60  ANIONGAP 11 13 12      Hematology Recent Labs  Lab 11/21/22 0524 11/22/22 0335  WBC 10.0 10.6*  RBC 4.80 5.08  HGB 13.8 14.6  HCT 42.5 44.6  MCV 88.5 87.8  MCH 28.8 28.7  MCHC 32.5 32.7  RDW 14.0 13.6  PLT 234 245    Cardiac Enzymes  Recent Labs  Lab 11/19/22 0533 11/19/22 0734  TROPONINIHS 8 9      BNP    Component Value Date/Time   BNP 334.5 (H) 11/19/2022 0533   Lipids  Lab Results  Component Value Date   CHOL 56 11/19/2022   HDL 18 (L) 11/19/2022   LDLCALC 33 11/19/2022   TRIG 25 11/19/2022   CHOLHDL 3.1 11/19/2022    HbA1c  Lab Results  Component Value Date   HGBA1C 6.2 (H) 11/19/2022    Radiology    -----   Telemetry    Afib, 70-140, mostly low 100's -  Personally Reviewed  Cardiac Studies   2D Echocardiogram 10.14.2024   1. Left ventricular ejection fraction, by estimation, is 30 to 35%. The  left ventricle has moderate to severely decreased function. The left  ventricle demonstrates global hypokinesis. The left ventricular internal  cavity size was moderately dilated.  There is mild left ventricular hypertrophy. Left ventricular diastolic  function could not be evaluated.   2. Right ventricular systolic function is moderately reduced. The right  ventricular size is mildly enlarged. There is moderately elevated  pulmonary artery systolic pressure.   3. Left atrial size was mildly dilated.   4. A small pericardial effusion is present.   5. The mitral valve is normal in structure. Mild mitral valve  regurgitation.   6. Tricuspid valve regurgitation is mild to moderate.   7. The aortic valve is tricuspid. Aortic valve regurgitation is not  visualized. No aortic stenosis is present.   8. There is mild dilatation of  the ascending aorta, measuring 42 mm.   9. The inferior vena cava is dilated in size with <50% respiratory  variability, suggesting right atrial pressure of 15 mmHg. _____________  Attempted DCCV x 3 10.21.2024 _____________   Patient Profile     57 y.o. male w/ a h/o DM, HL, obesity, and OSA on CPAP, who was admitted 10/4 w/ Afib RVR, CHF, and newly dx dilated cardiomyopathy.   Assessment & Plan    A-fib with RVR: Episode of A-fib CHF.  Rates difficult to control-70s to 140s.  AV nodal blocking agent is clobetasol blood pressures.  Status post cardioversion on October 21, which was unsuccessful.  He is now on amiodarone infusion, tolerating well.  Continue beta-blocker and digoxin.  Plan for repeat cardioversion on Thursday, October 24.  Eventual outpatient EP eval.  Continue CPAP for sleep apnea.  2.  Dilated cardiomyopathy/tachycardia induced cardiomyopathy/acute heart failure with reduced EF:  In setting of above, EF 30-35% w/ glob HK.   He has responded well to IV diuresis and feels significantly better. Min 1L yesterday.  Down ~ 20 kg since admission.  BUN/creat up slightly more this AM.  Will change to PO lasix. Cont ? blocker w/ plan to transition to long-acting following repeat DCCV.  Cont digoxin.  No BP room for ace/arb/arni/mra.  Will cont to hold sglt2i given repeat need for anesthesia/DCCV on Thursday.  3.  DMII:  A1c 6.2.  On jardiance (held for DCCV/anesthesia).   4.  OSA:  uses CPAP.   5.  Dilated Asc Ao:  42 mm on echo.  Can plan CTA as outpt to better eval and establish baseline.  Signed, Nicolasa Ducking, NP  11/27/2022, 1:13 PM    For questions or updates, please contact   Please consult [email or web address] for contact info under Cardiology/STEMI.

## 2022-11-27 NOTE — Progress Notes (Signed)
PROGRESS NOTE    Casey Velez   UJW:[id number] DOB: 1966/01/15  DOA: 11/19/2022 Date of Service: 11/27/22 which is hospital day 8  PCP: Marjie Skiff, NP    HPI: Casey Velez is a 57 y.o. male with medical history significant of morbid obesity, type 2 diabetes, hyperlipidemia presenting with SOB x1 month with exertion and productive cough - states nothing got worse for him to come in to ED but wants to get checked out  Hospital course / significant events:  10/14: to ED, admitted to hospitalist service for Afib RVR, suspected CHF, started on diltiazem and heparin, cardiology consulted 10/15: Echocardiogram yesterday evening (+)LVEF 30-35% with global hypokinesis, mild LVH. RV mildly dilated with moderately reduced contraction and mod Pulm HTN (RVSP 50 mmHg). There is mild to moderate tricuspid and mild mitral regurgitation. CVP severely elevated. Planning ischemia eval outpatient. Plan DCCV when diuresed  10/16 -10/19: continue diuresis. Plan DCCV Monday 10/21. Net IO Since Admission: -14,865.77 mL [11/24/22 0907]. 10/19 UOP slowing somewhat  10/19: tachycardic but no symptoms  10/20: HR improved, UOP has leveled off, await DCCV tomorrow  10/21: TEE/DCCV today, briefly converted but then back to afib, continue on IV amiodarone and consider repeat cardioversion  10/22: planning repeat cardioversion Thursday 10/24  Consultants:  Cardiology   Procedures/Surgeries: 11/26/22 TEE, DC cardioversion - remains in Afib      ASSESSMENT & PLAN:   Atrial fibrillation with rapid ventricular response, new diagnosis, POA CHA2DS2-VASc of 2 (diabetes and CHF) metoprolol 12.5 mg po q6h Digoxin per cardiology  Amiodarone infusion  continue Eliquis planning repeat cardioversion 10/24   Acute systolic congestive heart failure, new diagnosis, POA Echo showed EF of 30 to 35%  metoprolol 12.5 mg po q6h Digoxin per cardiology  Lasix to po  Strict I&O, daily weight GDMT - BP limits use  of ace/arb/arni/mra, and hold sglt2i given repeat need for anesthesia/DCCV on Thursday.  Outpatient ischemic w/u    OSA on CPAP CPAP   GERD without esophagitis Continue PPI therapy   Morbid obesity (HCC) BMI in 50s weight loss with dieting lifestyle modification    Hyperlipidemia associated with type 2 diabetes mellitus (HCC) Continue statin therapy   Type 2 diabetes mellitus with morbid obesity (HCC) Continue insulin therapy Monitor glucose level daily    Morbid obesity based on BMI: Body mass index is 53.26 kg/m.   DVT prophylaxis: Eliquis IV fluids: no continuous IV fluids  Nutrition: cardiac/carb diet  Central lines / invasive devices: none  Code Status: FULL CODE  ACP documentation reviewed: 11/21/22 and none on file in [facility]  TOC needs: TBD Barriers to dispo / significant pending items: on amiodarone, possible repeat cardioversion pending             Subjective / Brief ROS:  Patient reports feeling okay today no concerns Seen following cardioversion  Denies CP/SOB.  Pain controlled.  Denies new weakness.  Tolerating diet.  Reports no concerns w/ urination/defecation.   Family Communication: family at bedside on rounds      Objective Findings:  Vitals:   11/27/22 1312 11/27/22 1313 11/27/22 1314 11/27/22 1315  BP:      Pulse: (!) 50 67 (!) 155 85  Resp: (!) 34 (!) 24 19 (!) 23  Temp:      TempSrc:      SpO2: 96% 99% 97% 96%  Weight:      Height:        Intake/Output Summary (Last 24 hours) at 11/27/2022 1553  Last data filed at 11/27/2022 1000 Gross per 24 hour  Intake 384.9 ml  Output 1350 ml  Net -965.1 ml   Filed Weights   11/19/22 0528 11/26/22 0804  Weight: (!) 149.7 kg 129.7 kg    Examination:  Physical Exam Constitutional:      General: He is not in acute distress.    Appearance: He is obese.  Cardiovascular:     Rate and Rhythm: Normal rate. Rhythm irregular.  Pulmonary:     Effort: Pulmonary effort is normal.      Breath sounds: Normal breath sounds.  Musculoskeletal:     Right lower leg: Edema (trace (improved)) present.     Left lower leg: Edema (trace (improved)) present.  Skin:    General: Skin is warm and dry.  Neurological:     General: No focal deficit present.     Mental Status: He is alert and oriented to person, place, and time.  Psychiatric:        Mood and Affect: Mood normal.        Behavior: Behavior normal.          Scheduled Medications:   apixaban  5 mg Oral BID   atorvastatin  80 mg Oral QHS   digoxin  0.25 mg Oral Daily   furosemide  20 mg Intravenous BID   gabapentin  200 mg Oral QHS   metoprolol tartrate  12.5 mg Oral Q6H   potassium chloride  20 mEq Oral Daily    Continuous Infusions:  amiodarone 30 mg/hr (11/27/22 1000)     PRN Medications:  ondansetron **OR** ondansetron (ZOFRAN) IV, mouth rinse  Antimicrobials from admission:  Anti-infectives (From admission, onward)    None           Data Reviewed:  I have personally reviewed the following...  CBC: Recent Labs  Lab 11/21/22 0524 11/22/22 0335  WBC 10.0 10.6*  HGB 13.8 14.6  HCT 42.5 44.6  MCV 88.5 87.8  PLT 234 245   Basic Metabolic Panel: Recent Labs  Lab 11/22/22 0849 11/23/22 0412 11/24/22 0439 11/25/22 0425 11/26/22 0412 11/27/22 1017  NA 136 137 136 135 136 136  K 3.5 4.0 4.1 4.1 4.0 3.8  CL 98 100 100 99 99 99  CO2 27 26 25 25 24 25   GLUCOSE 99 99 105* 102* 99 129*  BUN 17 20 22* 26* 33* 35*  CREATININE 0.94 0.91 0.93 1.03 1.09 1.22  CALCIUM 8.9 9.2 9.4 9.4 9.6 9.3  MG 2.0 2.2 2.4  --   --   --    GFR: Estimated Creatinine Clearance: 87.3 mL/min (by C-G formula based on SCr of 1.22 mg/dL). Liver Function Tests: No results for input(s): "AST", "ALT", "ALKPHOS", "BILITOT", "PROT", "ALBUMIN" in the last 168 hours.  No results for input(s): "LIPASE", "AMYLASE" in the last 168 hours. No results for input(s): "AMMONIA" in the last 168 hours. Coagulation  Profile: No results for input(s): "INR", "PROTIME" in the last 168 hours.  Cardiac Enzymes: No results for input(s): "CKTOTAL", "CKMB", "CKMBINDEX", "TROPONINI" in the last 168 hours. BNP (last 3 results) No results for input(s): "PROBNP" in the last 8760 hours. HbA1C: No results for input(s): "HGBA1C" in the last 72 hours.  CBG: Recent Labs  Lab 11/22/22 2111 11/23/22 0811 11/23/22 1156 11/23/22 1658 11/26/22 1147  GLUCAP 114* 101* 119* 88 98   Lipid Profile: No results for input(s): "CHOL", "HDL", "LDLCALC", "TRIG", "CHOLHDL", "LDLDIRECT" in the last 72 hours.  Thyroid Function Tests: No  results for input(s): "TSH", "T4TOTAL", "FREET4", "T3FREE", "THYROIDAB" in the last 72 hours.  Anemia Panel: No results for input(s): "VITAMINB12", "FOLATE", "FERRITIN", "TIBC", "IRON", "RETICCTPCT" in the last 72 hours. Most Recent Urinalysis On File:     Component Value Date/Time   COLORURINE YELLOW (A) 07/15/2020 0528   APPEARANCEUR Clear 02/01/2021 0932   LABSPEC 1.031 (H) 07/15/2020 0528   PHURINE 5.0 07/15/2020 0528   GLUCOSEU Negative 02/01/2021 0932   HGBUR NEGATIVE 07/15/2020 0528   BILIRUBINUR Negative 02/01/2021 0932   KETONESUR NEGATIVE 07/15/2020 0528   PROTEINUR Negative 02/01/2021 0932   PROTEINUR 30 (A) 07/15/2020 0528   UROBILINOGEN 0.2 04/15/2020 1037   NITRITE Negative 02/01/2021 0932   NITRITE NEGATIVE 07/15/2020 0528   LEUKOCYTESUR Negative 02/01/2021 0932   LEUKOCYTESUR NEGATIVE 07/15/2020 0528   Sepsis Labs: @LABRCNTIP (procalcitonin:4,lacticidven:4) Microbiology: No results found for this or any previous visit (from the past 240 hour(s)).    Radiology Studies last 3 days: ECHO TEE  Result Date: 11/27/2022    TRANSESOPHOGEAL ECHO REPORT   Patient Name:   ALEXANDROS ERVIN Date of Exam: 11/26/2022 Medical Rec #:  [id number]      Height:       66.0 in Accession #:    [id number]     Weight:       286.0 lb Date of Birth:  1965-09-20     BSA:          2.328 m  Patient Age:    56 years       BP:           119/90 mmHg Patient Gender: M              HR:           82 bpm. Exam Location:  [facility] Procedure: Transesophageal Echo, Color Doppler and Cardiac Doppler Indications:     Atrial Fibrillation  History:         Patient has prior history of Echocardiogram examinations, most                  recent 11/20/2022. Risk Factors:Dyslipidemia. Persistent A-fib.  Sonographer:     Cristela Blue Referring Phys:  [id number] SHERI HAMMOCK Diagnosing Phys: Debbe Odea MD PROCEDURE: The transesophogeal probe was passed without difficulty through the esophogus of the patient. Sedation performed by different physician. The patient developed no complications during the procedure. An unsuccessful direct current cardioversion was performed at 200 joules with 3 attempts.  IMPRESSIONS  1. Left ventricular ejection fraction, by estimation, is 30%. The left ventricle has moderate to severely decreased function.  2. Right ventricular systolic function is moderately reduced. The right ventricular size is normal.  3. Left atrial size was moderately dilated. No left atrial/left atrial appendage thrombus was detected.  4. Right atrial size was mildly dilated.  5. The mitral valve is normal in structure. Mild mitral valve regurgitation.  6. The aortic valve is tricuspid. Aortic valve regurgitation is not visualized. Aortic valve sclerosis is present, with no evidence of aortic valve stenosis. FINDINGS  Left Ventricle: Left ventricular ejection fraction, by estimation, is 30%. The left ventricle has moderate to severely decreased function. The left ventricular internal cavity size was normal in size. Right Ventricle: The right ventricular size is normal. No increase in right ventricular wall thickness. Right ventricular systolic function is moderately reduced. Left Atrium: Left atrial size was moderately dilated. No left atrial/left atrial appendage thrombus was detected. Right Atrium: Right atrial size  was mildly dilated. Pericardium:  There is no evidence of pericardial effusion. Mitral Valve: The mitral valve is normal in structure. Mild mitral valve regurgitation. Tricuspid Valve: The tricuspid valve is normal in structure. Tricuspid valve regurgitation is trivial. Aortic Valve: The aortic valve is tricuspid. Aortic valve regurgitation is not visualized. Aortic valve sclerosis is present, with no evidence of aortic valve stenosis. Pulmonic Valve: The pulmonic valve was normal in structure. Pulmonic valve regurgitation is not visualized. Aorta: The aortic root is normal in size and structure. IAS/Shunts: No atrial level shunt detected by color flow Doppler. Debbe Odea MD Electronically signed by Debbe Odea MD Signature Date/Time: 11/27/2022/10:06:18 AM    Final           Sunnie Nielsen, DO [facility] 11/27/2022, 3:53 PM    Dictation software may have been used to generate the above note. Typos may occur and escape review in typed/dictated notes. Please contact Dr Lyn Hollingshead directly for clarity if needed.  Staff may message me via secure chat in Epic  but this may not receive an immediate response,  please page me for urgent matters!  If 7PM-7AM, please contact night coverage [email or web address]

## 2022-11-27 NOTE — Plan of Care (Signed)

## 2022-11-27 NOTE — Progress Notes (Signed)
[facility] HF Stewardship  PCP: Marjie Skiff, NP  PCP-Cardiologist: Lorine Bears, MD  HPI: Casey Velez is a 57 y.o. male with morbid obesity, type 2 diabetes (A1c 6.2), hyperlipidemia presenting with A-fib with RVR  who presented with worsening shortness of breath over 1 month. Troponin was negative on admission. BNP elevated to 334.5. CXR showed enlarged cardiac silhouette. TTE on 11/19/22 showed LVEF of 30-35%, moderately reduced RV, small pericardial effusion, mild MR, mild-moderate TR. UnderwentTEE guided DCCV on 11/27/22. TEE showed no intracardiac thrombus. Patient was shocked twice with the second leading to brief sinus rhythm before converting back to atrial fibrillation. Cardiology planning to repeat DCCV after amiodarone load.  Pertinent cardiac history: No significant cardiac history prior to admission.  Pertinent Lab Values: Creatinine, Ser  Date Value Ref Range Status  11/26/2022 1.09 0.61 - 1.24 mg/dL Final   BUN  Date Value Ref Range Status  11/26/2022 33 (H) 6 - 20 mg/dL Final  16/11/9602 12 6 - 24 mg/dL Final   Potassium  Date Value Ref Range Status  11/26/2022 4.0 3.5 - 5.1 mmol/L Final   Sodium  Date Value Ref Range Status  11/26/2022 136 135 - 145 mmol/L Final  09/19/2022 141 134 - 144 mmol/L Final   B Natriuretic Peptide  Date Value Ref Range Status  11/19/2022 334.5 (H) 0.0 - 100.0 pg/mL Final    Comment:    Performed at [facility], [address] [place], [place] [address]   Magnesium  Date Value Ref Range Status  11/24/2022 2.4 1.7 - 2.4 mg/dL Final    Comment:    Performed at [facility], [address] [place], [place] [address]   HB A1C ([facility] - [facility])  Date Value Ref Range Status  09/19/2022 6.4 (H) 4.8 - 5.6 % Final    Comment:             Prediabetes: 5.7 - 6.4          Diabetes: >6.4          Glycemic control for adults with diabetes: <7.0    Hgb A1c MFr Bld  Date Value Ref Range Status  11/19/2022  6.2 (H) 4.8 - 5.6 % Final    Comment:    (NOTE) Pre diabetes:          5.7%-6.4%  Diabetes:              >6.4%  Glycemic control for   <7.0% adults with diabetes    Digoxin Level  Date Value Ref Range Status  11/24/2022 0.2 (L) 0.8 - 2.0 ng/mL Final    Comment:    Performed at [facility], [address] [place], [place] [address]   TSH  Date Value Ref Range Status  11/19/2022 2.290 0.350 - 4.500 uIU/mL Final    Comment:    Performed by a 3rd Generation assay with a functional sensitivity of <=0.01 uIU/mL. Performed at [facility], [address] [place], [place] [address]   03/21/2022 2.410 0.450 - 4.500 uIU/mL Final    Vital Signs: Temp:  [97.6 F (36.4 C)-98.9 F (37.2 C)] 98.4 F (36.9 C) (10/22 0324) Pulse Rate:  [56-127] 59 (10/21 1949) Cardiac Rhythm: Atrial fibrillation (10/21 1900) Resp:  [11-76] 11 (10/21 1400) BP: (90-128)/(73-116) 107/85 (10/22 0324) SpO2:  [92 %-100 %] 99 % (10/21 2300) FiO2 (%):  [28 %] 28 % (10/21 2300)   Intake/Output Summary (Last 24 hours) at 11/27/2022 0834 Last data  filed at 11/27/2022 0325 Gross per 24 hour  Intake --  Output 1350 ml  Net -1350 ml    Current Inpatient Medications:  -Metoprolol tartrate 12.5 mg q6h -Jardiance 10 mg daily -Digoxin 0.25 mg daily -Furosemide 20 mg IV BID  Prior to admission HF Medications:  -Jardiance 10 mg daily  Assessment: 1. Acute heart failure (LVEF 30-35%), due to unknown etiology, most likely NICM from AF. NYHA class II-III symptoms.  -Symptoms: Shortness of breath, LEE, and orthopnea are significantly improved.  -Volume: Appears euvolemic today. BUN trending up and creatinine is stable. Net negative 17L on admission. Recommend swapping to oral diuretics tomorrow. -Hemodynamics: BP stable in 100s/70s and HR is 110s after starting amiodarone. Remains in AF. -BB: Metoprolol tartrate 12.5 mg every 6 hours for rate control. Can consolidate to metoprolol  succinate prior to discharge. May need to reduce dose after DCCV. -ACEI/ARB/ARNI: None at this time, can consider prior to discharge pending BP. -MRA: Would benefit from spironolactone to maintain normokalemia. Low dose spironolactone has minimal impact on BP. -SGLT2i: Continue Jardiance 10 mg daily -Cardiology planning repeat DCCV after adequate amiodarone load. -Digoxin started, trough of 0.2. Continue current dose.   Plan: 1) Medication changes recommended at this time: -None  2) Patient assistance: -Entresto requires Prior Authorization, Eliquis copay is $50.00, Xarelto copay is $50.00, [place] Not covered, Jardiance copay is $50.00  -Eligible for copay cards  3) Education: - Patient has been educated on current HF medications and potential additions to HF medication regimen - Patient verbalizes understanding that over the next few months, these medication doses may change and more medications may be added to optimize HF regimen - Patient has been educated on basic disease state pathophysiology and goals of therapy  Medication Assistance / Insurance Benefits Check:  Does the patient have prescription insurance?    Type of insurance plan:   Does the patient qualify for medication assistance through manufacturers or grants? No   Outpatient Pharmacy:  Prior to admission outpatient pharmacy: [facility]   Is the patient willing to utilize a [facility] pharmacy at discharge?: Yes  Please do not hesitate to reach out with questions or concerns,  Enos Fling, PharmD, CPP, BCPS Heart Failure Pharmacist  Phone - [phone] 11/27/2022 8:34 AM

## 2022-11-28 DIAGNOSIS — I5021 Acute systolic (congestive) heart failure: Secondary | ICD-10-CM | POA: Diagnosis not present

## 2022-11-28 DIAGNOSIS — E1169 Type 2 diabetes mellitus with other specified complication: Secondary | ICD-10-CM | POA: Diagnosis not present

## 2022-11-28 DIAGNOSIS — I420 Dilated cardiomyopathy: Secondary | ICD-10-CM | POA: Diagnosis not present

## 2022-11-28 DIAGNOSIS — I4891 Unspecified atrial fibrillation: Secondary | ICD-10-CM | POA: Diagnosis not present

## 2022-11-28 LAB — BASIC METABOLIC PANEL
Anion gap: 13 (ref 5–15)
BUN: 32 mg/dL — ABNORMAL HIGH (ref 6–20)
CO2: 25 mmol/L (ref 22–32)
Calcium: 9.6 mg/dL (ref 8.9–10.3)
Chloride: 98 mmol/L (ref 98–111)
Creatinine, Ser: 1.15 mg/dL (ref 0.61–1.24)
GFR, Estimated: >60 mL/min (ref >60)
Glucose, Bld: 98 mg/dL (ref 70–99)
Potassium: 3.6 mmol/L (ref 3.5–5.1)
Sodium: 136 mmol/L (ref 135–145)

## 2022-11-28 LAB — DIGOXIN LEVEL: Digoxin Level: <0.2 ng/mL — ABNORMAL LOW (ref 0.8–2.0)

## 2022-11-28 LAB — MAGNESIUM: Magnesium: 2.5 mg/dL — ABNORMAL HIGH (ref 1.7–2.4)

## 2022-11-28 MED ORDER — SODIUM CHLORIDE 0.9 % IV SOLN: INTRAVENOUS | Status: DC

## 2022-11-28 MED ORDER — FUROSEMIDE 40 MG PO TABS
40.0000 mg | ORAL_TABLET | Freq: Every day | ORAL | Status: DC
  Administered 2022-11-28 – 2022-11-30 (×3): 40 mg via ORAL
  Filled 2022-11-28 (×3): qty 1

## 2022-11-28 NOTE — Anesthesia Postprocedure Evaluation (Signed)
Anesthesia Post Note  Patient: TRENNON SAUCERMAN  Procedure(s) Performed: TRANSESOPHAGEAL ECHOCARDIOGRAM CARDIOVERSION  Patient location during evaluation: Specials Recovery Anesthesia Type: General Level of consciousness: awake and alert Pain management: pain level controlled Vital Signs Assessment: post-procedure vital signs reviewed and stable Respiratory status: spontaneous breathing, nonlabored ventilation, respiratory function stable and patient connected to nasal cannula oxygen Cardiovascular status: blood pressure returned to baseline and stable Postop Assessment: no apparent nausea or vomiting Anesthetic complications: no   No notable events documented.   Last Vitals:  Vitals:   11/28/22 0343 11/28/22 0516  BP: 116/62 (!) 137/124  Pulse:  99  Resp:    Temp: 36.6 C   SpO2:      Last Pain:  Vitals:   11/28/22 0343  TempSrc: Oral  PainSc:                  Cleda Mccreedy Fumio Vandam

## 2022-11-28 NOTE — Progress Notes (Signed)
Progress Note   [name]:[id number] DOB: 06-Sep-1965 DOA: 11/19/2022     9 DOS: the patient was seen and examined on 11/28/2022     Subjective / Brief ROS:  Patient seen and examined at bedside this morning in the presence of the girlfriend Currently on amiodarone drip Denies chest pain nausea vomiting abdominal pain or urinary complaints    Hospital course / significant events:  HPI: Casey Velez is a 57 y.o. male with medical history significant of morbid obesity, type 2 diabetes, hyperlipidemia presenting with SOB x1 month with exertion and productive cough - states nothing got worse for him to come in to ED but wants to get checked out   10/14: to ED, admitted to hospitalist service for Afib RVR, suspected CHF, started on diltiazem and heparin, cardiology consulted 10/15: Echocardiogram yesterday evening (+)LVEF 30-35% with global hypokinesis, mild LVH. RV mildly dilated with moderately reduced contraction and mod Pulm HTN (RVSP 50 mmHg). There is mild to moderate tricuspid and mild mitral regurgitation. CVP severely elevated. Planning ischemia eval outpatient. Plan DCCV when diuresed  10/16 -10/19: continue diuresis. Plan DCCV Monday 10/21. Net IO Since Admission: -14,865.77 mL [11/24/22 0907]. 10/19 UOP slowing somewhat  10/19: tachycardic but no symptoms  10/20: HR improved, UOP has leveled off, await DCCV tomorrow  10/21: TEE/DCCV today, briefly converted but then back to afib, continue on IV amiodarone and consider repeat cardioversion  10/22: planning repeat cardioversion Thursday 10/24   Consultants:  Cardiology    Procedures/Surgeries: 11/26/22 TEE, DC cardioversion - remains in Afib   ASSESSMENT & PLAN:   Atrial fibrillation with rapid ventricular response, new diagnosis, POA CHA2DS2-VASc of 2 (diabetes and CHF) Continue metoprolol 12.5 mg po q6h Digoxin per cardiology  Continue amiodarone infusion  continue Eliquis planning repeat  cardioversion 10/24 Cardiologist on board and case discussed   Acute systolic congestive heart failure, new diagnosis, POA Echo showed EF of 30 to 35%  metoprolol 12.5 mg po q6h Continue digoxin per cardiology  Lasix to po  Strict I&O, daily weight GDMT - BP limits use of ace/arb/arni/mra, and hold sglt2i given repeat need for anesthesia/DCCV on Thursday.  Outpatient ischemic w/u    OSA on CPAP Continue CPAP   GERD without esophagitis Continue PPI therapyMorbid obesity (HCC) BMI in 50s weight loss with dieting lifestyle modification    Hyperlipidemia associated with type 2 diabetes mellitus (HCC) Continue statin therapy   Type 2 diabetes mellitus with morbid obesity (HCC) Continue insulin therapy Monitor glucose level daily       Morbid obesity based on BMI: Body mass index is 53.26 kg/m.    DVT prophylaxis: Eliquis IV fluids: no continuous IV fluids  Nutrition: cardiac/carb diet  Central lines / invasive devices: none   Code Status: FULL CODE  ACP documentation reviewed: 11/21/22 and none on file in [facility]   TOC needs: TBD Barriers to dispo / significant pending items: on amiodarone, possible repeat cardioversion pending    Family Communication: family at bedside on rounds     Examination:  Physical Exam Constitutional: Middle-age male sitting up in bed, not in acute distress Cardiovascular: S1-S2 present irregularly irregular rhythm Pulmonary:     Effort: Pulmonary effort is normal.     Breath sounds: Normal breath sounds.  Musculoskeletal: No edema noted Skin:    General: Skin is warm and dry.  Neurological:     General: No focal deficit present.     Mental Status: He is alert and oriented to  person, place, and time.  Psychiatric:        Mood and Affect: Mood normal.        Behavior: Behavior normal.       Vitals:   11/28/22 0800 11/28/22 0844 11/28/22 1201 11/28/22 1651  BP:  (!) 128/109 102/81 (!) 101/90  Pulse: (!) 33  71 81  Resp: 16  (!) 24  20  Temp:  97.9 F (36.6 C) 98.4 F (36.9 C) 98 F (36.7 C)  TempSrc:  Oral    SpO2: 98%  97% 97%  Weight:      Height:          Latest Ref Rng & Units 11/22/2022    3:35 AM 11/21/2022    5:24 AM 11/20/2022    5:27 AM  CBC  WBC 4.0 - 10.5 K/uL 10.6  10.0  11.7   Hemoglobin 13.0 - 17.0 g/dL 65.7  84.6  96.2   Hematocrit 39.0 - 52.0 % 44.6  42.5  42.0   Platelets 150 - 400 K/uL 245  234  237        Latest Ref Rng & Units 11/28/2022    4:37 AM 11/27/2022   10:17 AM 11/26/2022    4:12 AM  BMP  Glucose 70 - 99 mg/dL 98  952  99   BUN 6 - 20 mg/dL 32  35  33   Creatinine 0.61 - 1.24 mg/dL 8.41  3.24  4.01   Sodium 135 - 145 mmol/L 136  136  136   Potassium 3.5 - 5.1 mmol/L 3.6  3.8  4.0   Chloride 98 - 111 mmol/L 98  99  99   CO2 22 - 32 mmol/L 25  25  24    Calcium 8.9 - 10.3 mg/dL 9.6  9.3  9.6     Data Reviewed: I have reviewed patient's lab reports, CBC, BMP, vitals, cardiology documentation as well as nursing staff documentation, I have also reviewed patient's telemetry that did not show any ST segment changes aside irregular rhythm   Time spent: 49 minutes  Author: Loyce Dys, MD 11/28/2022 5:35 PM  For on call review [email or web address].

## 2022-11-28 NOTE — Progress Notes (Signed)
[facility] HF Stewardship  PCP: Marjie Skiff, NP  PCP-Cardiologist: Lorine Bears, MD  HPI: Casey Velez is a 57 y.o. male with morbid obesity, type 2 diabetes (A1c 6.2), hyperlipidemia presenting with A-fib with RVR  who presented with worsening shortness of breath over 1 month. Troponin was negative on admission. BNP elevated to 334.5. CXR showed enlarged cardiac silhouette. TTE on 11/19/22 showed LVEF of 30-35%, moderately reduced RV, small pericardial effusion, mild MR, mild-moderate TR. UnderwentTEE guided DCCV on 11/27/22. TEE showed no intracardiac thrombus. Patient was shocked twice with the second leading to brief sinus rhythm before converting back to atrial fibrillation. Cardiology planning to repeat DCCV after amiodarone load.  Pertinent cardiac history: No significant cardiac history prior to admission.  Pertinent Lab Values: Creatinine, Ser  Date Value Ref Range Status  11/28/2022 1.15 0.61 - 1.24 mg/dL Final   BUN  Date Value Ref Range Status  11/28/2022 32 (H) 6 - 20 mg/dL Final  27/25/3664 12 6 - 24 mg/dL Final   Potassium  Date Value Ref Range Status  11/28/2022 3.6 3.5 - 5.1 mmol/L Final   Sodium  Date Value Ref Range Status  11/28/2022 136 135 - 145 mmol/L Final  09/19/2022 141 134 - 144 mmol/L Final   B Natriuretic Peptide  Date Value Ref Range Status  11/19/2022 334.5 (H) 0.0 - 100.0 pg/mL Final    Comment:    Performed at [facility], [address] [place], [place] [address]   Magnesium  Date Value Ref Range Status  11/24/2022 2.4 1.7 - 2.4 mg/dL Final    Comment:    Performed at [facility], [address] [place], [place] [address]   HB A1C ([facility])  Date Value Ref Range Status  09/19/2022 6.4 (H) 4.8 - 5.6 % Final    Comment:             Prediabetes: 5.7 - 6.4          Diabetes: >6.4          Glycemic control for adults with diabetes: <7.0    Hgb A1c MFr Bld  Date Value Ref Range Status  11/19/2022  6.2 (H) 4.8 - 5.6 % Final    Comment:    (NOTE) Pre diabetes:          5.7%-6.4%  Diabetes:              >6.4%  Glycemic control for   <7.0% adults with diabetes    Digoxin Level  Date Value Ref Range Status  11/24/2022 0.2 (L) 0.8 - 2.0 ng/mL Final    Comment:    Performed at [facility], [address] [place], [place] [address]   TSH  Date Value Ref Range Status  11/19/2022 2.290 0.350 - 4.500 uIU/mL Final    Comment:    Performed by a 3rd Generation assay with a functional sensitivity of <=0.01 uIU/mL. Performed at [facility], [address] [place], [place] [address]   03/21/2022 2.410 0.450 - 4.500 uIU/mL Final    Vital Signs: Temp:  [97.7 F (36.5 C)-98 F (36.7 C)] 97.9 F (36.6 C) (10/23 0343) Pulse Rate:  [27-162] 99 (10/23 0516) Cardiac Rhythm: Atrial fibrillation (10/22 1900) Resp:  [12-43] 18 (10/22 1952) BP: (100-146)/(53-126) 137/124 (10/23 0516) SpO2:  [87 %-100 %] 98 % (10/22 1952)   Intake/Output Summary (Last 24 hours) at 11/28/2022 0745 Last data filed at 11/27/2022 2315 Gross per 24 hour  Intake 1649.08 ml  Output 700 ml  Net 949.08 ml   Current Inpatient Medications:  -Metoprolol tartrate 12.5 mg q6h -Jardiance 10 mg daily -Digoxin 0.25 mg daily -Furosemide 20 mg IV BID  Prior to admission HF Medications:  -Jardiance 10 mg daily  Assessment: 1. Acute heart failure (LVEF 30-35%), due to unknown etiology, most likely NICM from AF. NYHA class II-III symptoms.  -Symptoms: Shortness of breath, LEE, and orthopnea are significantly improved.  -Volume: Appears euvolemic today. BUN trending up and creatinine is stable. Net negative 17L on admission. Recommend swapping to oral diuretics today. -Hemodynamics: BP stable in 110-130s/60-100s. HR 130s while in the room just after the patient walked, but appears lower overnight.  -BB: Metoprolol tartrate 12.5 mg every 6 hours for rate control. Can consolidate to metoprolol  succinate prior to discharge. May need to reduce dose after DCCV. -ACEI/ARB/ARNI: None at this time, can consider prior to discharge pending BP. -MRA: Would benefit from spironolactone to maintain normokalemia. Low dose spironolactone has minimal impact on BP. -SGLT2i: Continue Jardiance 10 mg daily -Cardiology planning repeat DCCV after adequate amiodarone load. Amiodarone stopped due to Qtc, which was 499 while in the room. DCCV likely to be unsuccessful without amiodarone given previous failure when euvolemic, can consider resuming and monitor for Qtc >550 vs sending home with EP follow up. Remains in AF.  -Digoxin started, consider repeat digoxin level given higher dose.   Plan: 1) Medication changes recommended at this time: -Consider transitioning IV furosemide to 40 mg oral daily today -Consider starting spironolactone 12.5 mg daily.  2) Patient assistance: -Entresto requires Prior Authorization, Eliquis copay is $50.00, Xarelto copay is $50.00, [place] Not covered, Jardiance copay is $50.00  -Eligible for copay cards  3) Education: - Patient has been educated on current HF medications and potential additions to HF medication regimen - Patient verbalizes understanding that over the next few months, these medication doses may change and more medications may be added to optimize HF regimen - Patient has been educated on basic disease state pathophysiology and goals of therapy  Medication Assistance / Insurance Benefits Check:  Does the patient have prescription insurance?    Type of insurance plan:   Does the patient qualify for medication assistance through manufacturers or grants? No   Outpatient Pharmacy:  Prior to admission outpatient pharmacy: [facility]   Is the patient willing to utilize a [facility] pharmacy at discharge?: Yes  Please do not hesitate to reach out with questions or concerns,  Enos Fling, PharmD, CPP, BCPS Heart Failure Pharmacist  Phone -  [phone] 11/28/2022 7:45 AM

## 2022-11-28 NOTE — Progress Notes (Signed)
Cardiology Progress Note   Patient Name: Casey Velez Date of Encounter: 11/28/2022  Primary Cardiologist: Lorine Bears, MD  Subjective   Feels well this AM.  Amio shut off over night by nsg staff due to concern for QTc prolongation, however QTc normal by our eval.  No chest pain, dyspnea, palpitations. Objective   Inpatient Medications    Scheduled Meds:  apixaban  5 mg Oral BID   atorvastatin  80 mg Oral QHS   digoxin  0.25 mg Oral Daily   furosemide  40 mg Oral Daily   gabapentin  200 mg Oral QHS   metoprolol tartrate  12.5 mg Oral Q6H   potassium chloride  20 mEq Oral Daily   Continuous Infusions:  amiodarone 60 mg/hr (11/28/22 1045)   PRN Meds: ondansetron **OR** ondansetron (ZOFRAN) IV, mouth rinse   Vital Signs    Vitals:   11/28/22 0516 11/28/22 0800 11/28/22 0844 11/28/22 1201  BP: (!) 137/124  (!) 128/109 102/81  Pulse: 99 (!) 33  71  Resp:  16  (!) 24  Temp:   97.9 F (36.6 C) 98.4 F (36.9 C)  TempSrc:   Oral   SpO2:  98%  97%  Weight:      Height:        Intake/Output Summary (Last 24 hours) at 11/28/2022 1205 Last data filed at 11/27/2022 2315 Gross per 24 hour  Intake 1600 ml  Output 700 ml  Net 900 ml   Filed Weights   11/19/22 0528 11/26/22 0804  Weight: (!) 149.7 kg 129.7 kg    Physical Exam   GEN: Well nourished, well developed, in no acute distress.  HEENT: Grossly normal.  Neck: Supple, obese, no carotid bruits or masses. Cardiac: IR, IR, no murmurs, rubs, or gallops. No clubbing, cyanosis, edema.  Radials 2+, DP/PT 2+ and equal bilaterally.  Respiratory:  Respirations regular and unlabored, clear to auscultation bilaterally. GI: Soft, nontender, nondistended, BS + x 4. MS: no deformity or atrophy. Skin: warm and dry, no rash. Neuro:  Strength and sensation are intact. Psych: AAOx3.  Normal affect.  Labs    Chemistry Recent Labs  Lab 11/26/22 0412 11/27/22 1017 11/28/22 0437  NA 136 136 136  K 4.0 3.8 3.6  CL  99 99 98  CO2 24 25 25   GLUCOSE 99 129* 98  BUN 33* 35* 32*  CREATININE 1.09 1.22 1.15  CALCIUM 9.6 9.3 9.6  GFRNONAA >60 >60 >60  ANIONGAP 13 12 13      Hematology Recent Labs  Lab 11/22/22 0335  WBC 10.6*  RBC 5.08  HGB 14.6  HCT 44.6  MCV 87.8  MCH 28.7  MCHC 32.7  RDW 13.6  PLT 245    Cardiac Enzymes  Recent Labs  Lab 11/19/22 0533 11/19/22 0734  TROPONINIHS 8 9      BNP    Component Value Date/Time   BNP 334.5 (H) 11/19/2022 0533   Lipids  Lab Results  Component Value Date   CHOL 56 11/19/2022   HDL 18 (L) 11/19/2022   LDLCALC 33 11/19/2022   TRIG 25 11/19/2022   CHOLHDL 3.1 11/19/2022    HbA1c  Lab Results  Component Value Date   HGBA1C 6.2 (H) 11/19/2022    Radiology    ------   Telemetry    Afib,  - Personally Reviewed  Cardiac Studies   2D Echocardiogram 10.14.2024   1. Left ventricular ejection fraction, by estimation, is 30 to 35%. The  left ventricle  has moderate to severely decreased function. The left  ventricle demonstrates global hypokinesis. The left ventricular internal  cavity size was moderately dilated.  There is mild left ventricular hypertrophy. Left ventricular diastolic  function could not be evaluated.   2. Right ventricular systolic function is moderately reduced. The right  ventricular size is mildly enlarged. There is moderately elevated  pulmonary artery systolic pressure.   3. Left atrial size was mildly dilated.   4. A small pericardial effusion is present.   5. The mitral valve is normal in structure. Mild mitral valve  regurgitation.   6. Tricuspid valve regurgitation is mild to moderate.   7. The aortic valve is tricuspid. Aortic valve regurgitation is not  visualized. No aortic stenosis is present.   8. There is mild dilatation of the ascending aorta, measuring 42 mm.   9. The inferior vena cava is dilated in size with <50% respiratory  variability, suggesting right atrial pressure of 15  mmHg. _____________   Attempted DCCV x 3 10.21.2024 _____________     Patient Profile     57 y.o. male w/ a h/o DM, HL, obesity, and OSA on CPAP, who was admitted 10/4 w/ Afib RVR, CHF, and newly dx dilated cardiomyopathy.   Assessment & Plan    1.  A-fib with RVR: Episode of A-fib CHF.  Rates remain difficult to control-70s to 140s.  ? blocker dose limited by blood pressures.  Status post cardioversion on October 21, which was unsuccessful.  We placed him on amio on 10/21, however, it was shut off over night due to concern for QTc prolongation.  All rhythm strips reviewed.  QT nl w/ variable QTc in setting of afib, sometimes rapid.  No evidence of significant QTc prolongation.  Resuming IV amio @ 60mg /hr today and will plan to continue this dose up to his planned DCCV tomorrow. Continue beta-blocker and digoxin.  Eventual outpatient EP eval.  Continue CPAP for sleep apnea.   2.  Dilated cardiomyopathy/tachycardia induced cardiomyopathy/acute heart failure with reduced EF:  In setting of above, EF 30-35% w/ glob HK.   He has responded well to IV diuresis and feels significantly better. Only 700 ml UO recorded yesterday.  OVerall, down ~ 20 kg since admission.  BUN/creat stable this AM.  Cont lasix 40 daily.  Cont ? blocker w/ plan to transition to long-acting following repeat DCCV.  Cont digoxin.  No BP room for ace/arb/arni/mra.  Will cont to hold sglt2i given repeat need for anesthesia/DCCV on Thursday.   3.  DMII:  A1c 6.2.  On jardiance (held for DCCV/anesthesia).   4.  OSA:  uses CPAP.   5.  Dilated Asc Ao:  42 mm on echo.  Can plan CTA as outpt to better eval and establish baseline.  Signed, Nicolasa Ducking, NP  11/28/2022, 12:05 PM    For questions or updates, please contact   Please consult [email or web address] for contact info under Cardiology/STEMI.

## 2022-11-28 NOTE — Progress Notes (Signed)
Good morning. Cardiology wants amiodarone re-started at 60 mg/hr. They will maintain this rate continuously pending planned cardioversion tomorrow. Thank you!

## 2022-11-29 ENCOUNTER — Encounter
Admission: EM | Disposition: A | Discharge status: Home / Self Care | Payer: Self-pay | Source: Home / Self Care | Attending: Osteopathic Medicine

## 2022-11-29 ENCOUNTER — Other Ambulatory Visit: Payer: Self-pay

## 2022-11-29 ENCOUNTER — Inpatient Hospital Stay: Payer: 59 | Admitting: Anesthesiology

## 2022-11-29 DIAGNOSIS — I517 Cardiomegaly: Secondary | ICD-10-CM | POA: Diagnosis not present

## 2022-11-29 DIAGNOSIS — I420 Dilated cardiomyopathy: Secondary | ICD-10-CM | POA: Diagnosis not present

## 2022-11-29 DIAGNOSIS — I4891 Unspecified atrial fibrillation: Secondary | ICD-10-CM | POA: Diagnosis not present

## 2022-11-29 DIAGNOSIS — I5021 Acute systolic (congestive) heart failure: Secondary | ICD-10-CM | POA: Diagnosis not present

## 2022-11-29 HISTORY — PX: CARDIOVERSION: SHX1299

## 2022-11-29 LAB — BASIC METABOLIC PANEL
Anion gap: 10 (ref 5–15)
BUN: 26 mg/dL — ABNORMAL HIGH (ref 6–20)
CO2: 27 mmol/L (ref 22–32)
Calcium: 8.9 mg/dL (ref 8.9–10.3)
Chloride: 98 mmol/L (ref 98–111)
Creatinine, Ser: 1.11 mg/dL (ref 0.61–1.24)
GFR, Estimated: >60 mL/min (ref >60)
Glucose, Bld: 105 mg/dL — ABNORMAL HIGH (ref 70–99)
Potassium: 3.7 mmol/L (ref 3.5–5.1)
Sodium: 135 mmol/L (ref 135–145)

## 2022-11-29 LAB — GLUCOSE, CAPILLARY
Glucose-Capillary: 346 mg/dL — ABNORMAL HIGH (ref 70–99)
Glucose-Capillary: 104 mg/dL — ABNORMAL HIGH (ref 70–99)
Glucose-Capillary: 112 mg/dL — ABNORMAL HIGH (ref 70–99)

## 2022-11-29 LAB — CBC WITH DIFFERENTIAL/PLATELET
Abs Immature Granulocytes: 0.04 10*3/uL (ref 0.00–0.07)
Basophils Absolute: 0.1 10*3/uL (ref 0.0–0.1)
Basophils Relative: 1 %
Eosinophils Absolute: 0.2 10*3/uL (ref 0.0–0.5)
Eosinophils Relative: 2 %
HCT: 49.8 % (ref 39.0–52.0)
Hemoglobin: 16.4 g/dL (ref 13.0–17.0)
Immature Granulocytes: 0 %
Lymphocytes Relative: 28 %
Lymphs Abs: 2.9 10*3/uL (ref 0.7–4.0)
MCH: 28.4 pg (ref 26.0–34.0)
MCHC: 32.9 g/dL (ref 30.0–36.0)
MCV: 86.3 fL (ref 80.0–100.0)
Monocytes Absolute: 1 10*3/uL (ref 0.1–1.0)
Monocytes Relative: 10 %
Neutro Abs: 6.3 10*3/uL (ref 1.7–7.7)
Neutrophils Relative %: 59 %
Platelets: 294 10*3/uL (ref 150–400)
RBC: 5.77 MIL/uL (ref 4.22–5.81)
RDW: 13.2 % (ref 11.5–15.5)
WBC: 10.6 10*3/uL — ABNORMAL HIGH (ref 4.0–10.5)
nRBC: 0 % (ref 0.0–0.2)

## 2022-11-29 SURGERY — CARDIOVERSION
Anesthesia: General

## 2022-11-29 MED ORDER — PHENYLEPHRINE 80 MCG/ML (10ML) SYRINGE FOR IV PUSH (FOR BLOOD PRESSURE SUPPORT)
PREFILLED_SYRINGE | INTRAVENOUS | Status: DC | PRN
  Administered 2022-11-29 (×3): 80 ug via INTRAVENOUS

## 2022-11-29 MED ORDER — LIDOCAINE HCL (CARDIAC) PF 100 MG/5ML IV SOSY
PREFILLED_SYRINGE | INTRAVENOUS | Status: DC | PRN
  Administered 2022-11-29: 100 mg via INTRAVENOUS

## 2022-11-29 MED ORDER — INSULIN GLARGINE-YFGN 100 UNIT/ML ~~LOC~~ SOLN
10.0000 [IU] | Freq: Every day | SUBCUTANEOUS | Status: DC
  Administered 2022-11-29: 10 [IU] via SUBCUTANEOUS
  Filled 2022-11-29 (×2): qty 0.1

## 2022-11-29 MED ORDER — INSULIN ASPART 100 UNIT/ML IJ SOLN: 0.0000 [IU] | Freq: Three times a day (TID) | INTRAMUSCULAR | Status: DC

## 2022-11-29 MED ORDER — INSULIN ASPART 100 UNIT/ML IJ SOLN: 0.0000 [IU] | Freq: Every day | INTRAMUSCULAR | Status: DC

## 2022-11-29 MED ORDER — PROPOFOL 10 MG/ML IV BOLUS
INTRAVENOUS | Status: DC | PRN
  Administered 2022-11-29: 20 mg via INTRAVENOUS
  Administered 2022-11-29: 60 mg via INTRAVENOUS
  Administered 2022-11-29 (×3): 20 mg via INTRAVENOUS

## 2022-11-29 NOTE — Anesthesia Postprocedure Evaluation (Signed)
Anesthesia Post Note  [name]  Procedure(s) Performed: CARDIOVERSION  Patient location during evaluation: Phase II Anesthesia Type: General Level of consciousness: awake and alert Pain management: satisfactory to patient Vital Signs Assessment: post-procedure vital signs reviewed and stable Respiratory status: spontaneous breathing Cardiovascular status: blood pressure returned to baseline Anesthetic complications: no   No notable events documented.   Last Vitals:  Vitals:   11/29/22 1409 11/29/22 1415  BP: 109/86 114/73  Pulse:    Resp: 12 12  Temp:    SpO2:  93%    Last Pain:  Vitals:   11/29/22 1228  TempSrc: Oral  PainSc: 0-No pain                 VAN STAVEREN,Shayona Hibbitts

## 2022-11-29 NOTE — Anesthesia Procedure Notes (Signed)
Procedure Name: General with mask airway Date/Time: 11/29/2022 1:45 PM  Performed by: Mohammed Kindle, CRNAPre-anesthesia Checklist: Patient identified, Emergency Drugs available, Suction available and Patient being monitored Patient Re-evaluated:Patient Re-evaluated prior to induction Oxygen Delivery Method: Simple face mask Induction Type: IV induction Placement Confirmation: positive ETCO2, CO2 detector and breath sounds checked- equal and bilateral Dental Injury: Teeth and Oropharynx as per pre-operative assessment

## 2022-11-29 NOTE — Progress Notes (Signed)
Rounding Note    Patient Name: Casey Velez Date of Encounter: 11/29/2022  [facility] Cardiologist: Lorine Bears, MD  Subjective   Cardioversion this morning, shocked x 3 Remains on amiodarone infusion Denies significant shortness of breath, no chest pain  Inpatient Medications    Scheduled Meds:  [MAR Hold] apixaban  5 mg Oral BID   [MAR Hold] atorvastatin  80 mg Oral QHS   [MAR Hold] digoxin  0.25 mg Oral Daily   [MAR Hold] furosemide  40 mg Oral Daily   [MAR Hold] gabapentin  200 mg Oral QHS   [MAR Hold] metoprolol tartrate  12.5 mg Oral Q6H   [MAR Hold] potassium chloride  20 mEq Oral Daily   Continuous Infusions:  sodium chloride 20 mL/hr at 11/29/22 1341   amiodarone 60 mg/hr (11/29/22 1341)   PRN Meds: [MAR Hold] ondansetron **OR** [MAR Hold] ondansetron (ZOFRAN) IV, [MAR Hold] mouth rinse   Vital Signs    Vitals:   11/29/22 1300 11/29/22 1330 11/29/22 1340 11/29/22 1400  BP:   110/82 114/77  Pulse: 99 (!) 122 (!) 139 75  Resp: 20 20 (!) 21 14  Temp:      TempSrc:      SpO2: 97% 94% 93% 95%  Weight:      Height:        Intake/Output Summary (Last 24 hours) at 11/29/2022 1410 Last data filed at 11/29/2022 0235 Gross per 24 hour  Intake 781.98 ml  Output 1100 ml  Net -318.02 ml      11/29/2022   12:28 PM 11/29/2022    7:49 AM 11/26/2022    8:04 AM  Last 3 Weights  Weight (lbs) 285 lb 7.9 oz 285 lb 8 oz 286 lb  Weight (kg) 129.5 kg 129.502 kg 129.729 kg      Telemetry    Normal sinus rhythm- Personally Reviewed  ECG     - Personally Reviewed  Physical Exam   GEN: No acute distress.   Neck: No JVD Cardiac: RRR, no murmurs, rubs, or gallops.  Respiratory: Clear to auscultation bilaterally. GI: Soft, nontender, non-distended  MS: No edema; No deformity. Neuro:  Nonfocal  Psych: Normal affect   Labs    High Sensitivity Troponin:   Recent Labs  Lab 11/19/22 0533 11/19/22 0734  TROPONINIHS 8 9      Chemistry Recent Labs  Lab 11/23/22 0412 11/24/22 0439 11/25/22 0425 11/27/22 1017 11/28/22 0437 11/29/22 0345  NA 137 136   < > 136 136 135  K 4.0 4.1   < > 3.8 3.6 3.7  CL 100 100   < > 99 98 98  CO2 26 25   < > 25 25 27   GLUCOSE 99 105*   < > 129* 98 105*  BUN 20 22*   < > 35* 32* 26*  CREATININE 0.91 0.93   < > 1.22 1.15 1.11  CALCIUM 9.2 9.4   < > 9.3 9.6 8.9  MG 2.2 2.4  --   --  2.5*  --   GFRNONAA >60 >60   < > >60 >60 >60  ANIONGAP 11 11   < > 12 13 10    < > = values in this interval not displayed.    Lipids No results for input(s): "CHOL", "TRIG", "HDL", "LABVLDL", "LDLCALC", "CHOLHDL" in the last 168 hours.  Hematology Recent Labs  Lab 11/29/22 0345  WBC 10.6*  RBC 5.77  HGB 16.4  HCT 49.8  MCV 86.3  MCH 28.4  MCHC 32.9  RDW 13.2  PLT 294   Thyroid No results for input(s): "TSH", "FREET4" in the last 168 hours.  BNPNo results for input(s): "BNP", "PROBNP" in the last 168 hours.  DDimer No results for input(s): "DDIMER" in the last 168 hours.   Radiology    No results found.  Cardiac Studies     Patient Profile     57 y.o. male   Assessment & Plan    Atrial fibrillation with RVR Difficult to control rates secondary to hypotension Continue Toprol tartrate 12.5 every 6 with digoxin Continue to amiodarone 1 mg/min Cardioversion today shocked x 3 delivered, normal sinus rhythm eventually rhythm restored High risk of recurrent arrhythmia given morbid obesity, sleep apnea Recommend we continue amiodarone infusion overnight with transition to oral amiodarone 400 twice daily tomorrow   Dilated cardiomyopathy In setting of atrial fibrillation with RVR ejection fraction 30 to 35% with global hypokinesis -20 L this admission Stable renal function, continue Lasix 40 daily Continue digoxin, transition to metoprolol succinate at discharge tomorrow Consider starting SGLT2 inhibitor possibly tomorrow   Diabetes type 2 A1c 6.2, Jardiance on hold   Consider starting tomorrow   Obstructive sleep apnea On CPAP   Dilated ascending aorta Estimated 4.2 cm on echo Periodic imaging as outpatient on annual basis     For questions or updates, please contact [facility] Please consult [email or web address] for contact info under        Signed, Julien Nordmann, MD  11/29/2022, 2:10 PM

## 2022-11-29 NOTE — CV Procedure (Signed)
Cardioversion procedure note For atrial fibrillation, persistent.  Procedure Details:  Consent: Risks of procedure as well as the alternatives and risks of each were explained to the (patient/caregiver).  Consent for procedure obtained.  Time Out: Verified patient identification, verified procedure, site/side was marked, verified correct patient position, special equipment/implants available, medications/allergies/relevent history reviewed, required imaging and test results available.  Performed  Patient placed on cardiac monitor, pulse oximetry, supplemental oxygen as necessary.   Sedation given: propofol IV, Dr. Randa Ngo Pacer pads placed anterior and posterior chest.   Cardioverted 3 time(s).   Cardioverted at  200J with manual compression, normal sinus rhythm briefly observed 200 J with manual compression, continued to have atrial fibs 200 J with manual compression converting to normal sinus rhythm sustained . Synchronized biphasic    Evaluation: Findings: Post procedure EKG shows: NSR Complications: None Patient did tolerate procedure well.  Time Spent Directly with the Patient:  45 minutes   Dossie Arbour, M.D., Ph.D.

## 2022-11-29 NOTE — Progress Notes (Signed)
Pt returned from cardioversion. Sr  amio at 60mg . Alert. Vss. Denies pain.

## 2022-11-29 NOTE — Progress Notes (Signed)
[facility] HF Stewardship  PCP: Marjie Skiff, NP  PCP-Cardiologist: Lorine Bears, MD  HPI: Casey Velez is a 57 y.o. male with morbid obesity, type 2 diabetes (A1c 6.2), hyperlipidemia presenting with A-fib with RVR  who presented with worsening shortness of breath over 1 month. Troponin was negative on admission. BNP elevated to 334.5. CXR showed enlarged cardiac silhouette. TTE on 11/19/22 showed LVEF of 30-35%, moderately reduced RV, small pericardial effusion, mild MR, mild-moderate TR. UnderwentTEE guided DCCV on 11/27/22. TEE showed no intracardiac thrombus. Patient was shocked twice with the second leading to brief sinus rhythm before converting back to atrial fibrillation. Cardiology planning to repeat DCCV today after amiodarone load.  Pertinent cardiac history: No significant cardiac history prior to admission.  Pertinent Lab Values: Creatinine, Ser  Date Value Ref Range Status  11/29/2022 1.11 0.61 - 1.24 mg/dL Final   BUN  Date Value Ref Range Status  11/29/2022 26 (H) 6 - 20 mg/dL Final  16/11/9602 12 6 - 24 mg/dL Final   Potassium  Date Value Ref Range Status  11/29/2022 3.7 3.5 - 5.1 mmol/L Final   Sodium  Date Value Ref Range Status  11/29/2022 135 135 - 145 mmol/L Final  09/19/2022 141 134 - 144 mmol/L Final   B Natriuretic Peptide  Date Value Ref Range Status  11/19/2022 334.5 (H) 0.0 - 100.0 pg/mL Final    Comment:    Performed at [facility], [address], [place], [place] [address]   Magnesium  Date Value Ref Range Status  11/28/2022 2.5 (H) 1.7 - 2.4 mg/dL Final    Comment:    Performed at [facility], [address] [place], [place] [address]   HB A1C ([facility])  Date Value Ref Range Status  09/19/2022 6.4 (H) 4.8 - 5.6 % Final    Comment:             Prediabetes: 5.7 - 6.4          Diabetes: >6.4          Glycemic control for adults with diabetes: <7.0    Hgb A1c MFr Bld  Date Value Ref Range Status   11/19/2022 6.2 (H) 4.8 - 5.6 % Final    Comment:    (NOTE) Pre diabetes:          5.7%-6.4%  Diabetes:              >6.4%  Glycemic control for   <7.0% adults with diabetes    Digoxin Level  Date Value Ref Range Status  11/28/2022 <0.2 (L) 0.8 - 2.0 ng/mL Final    Comment:    Performed at [facility], [address] [place], [place] [address]   TSH  Date Value Ref Range Status  11/19/2022 2.290 0.350 - 4.500 uIU/mL Final    Comment:    Performed by a 3rd Generation assay with a functional sensitivity of <=0.01 uIU/mL. Performed at [facility], [address] [place], [place] [address]   03/21/2022 2.410 0.450 - 4.500 uIU/mL Final    Vital Signs: Temp:  [97.3 F (36.3 C)-98.4 F (36.9 C)] 97.3 F (36.3 C) (10/24 0339) Pulse Rate:  [33-115] 88 (10/24 0339) Cardiac Rhythm: Atrial fibrillation;Bundle branch block;Other (Comment) (10/23 2030) Resp:  [16-24] 19 (10/24 0339) BP: (98-128)/(75-109) 103/75 (10/24 0339) SpO2:  [97 %-98 %] 98 % (10/24 0339)   Intake/Output Summary (Last 24 hours) at 11/29/2022 0745 Last data filed at 11/29/2022 0235  Gross per 24 hour  Intake 1261.98 ml  Output 1100 ml  Net 161.98 ml   Current Inpatient Medications:  -Metoprolol tartrate 12.5 mg q6h -Jardiance 10 mg daily -Digoxin 0.25 mg daily -Furosemide 20 mg IV BID  Prior to admission HF Medications:  -Jardiance 10 mg daily  Assessment: 1. Acute heart failure (LVEF 30-35%), due to unknown etiology, most likely NICM from AF. NYHA class II-III symptoms.  -Symptoms: Shortness of breath, LEE, and orthopnea are significantly improved.  -Volume: Appears euvolemic. BUN and creatinine are stable. Net negative 17L on admission. -Hemodynamics: BP low overnight in 100s/60s with one reading in the upper 90s. Hypotension has been asymptomatic. -BB: Metoprolol tartrate 12.5 mg every 6 hours for rate control. Can consolidate to metoprolol succinate prior to discharge. May  need to reduce dose after DCCV. -ACEI/ARB/ARNI: None at this time, can consider prior to discharge pending BP. -MRA: Would benefit from spironolactone to maintain normokalemia. Although low dose spironolactone has minimal impact on BP, would wait until SBP is consistently ~110 mmHg. -SGLT2i: Continue Jardiance 10 mg daily -Cardiology planning repeat DCCV today. Has not yet had 10g load of amiodarone. Amiodarone held 10/23 AM due to Qtc.  -Digoxin started, level at steady state is ok.  Plan: 1) Medication changes recommended at this time: -Will follow up BP/HR after repeat DCCV -Consider additional potassium 40 meq today  2) Patient assistance: -Entresto requires Prior Authorization, Eliquis copay is $50.00, Xarelto copay is $50.00, [place] Not covered, Jardiance copay is $50.00  -Eligible for copay cards  3) Education: - Patient has been educated on current HF medications and potential additions to HF medication regimen - Patient verbalizes understanding that over the next few months, these medication doses may change and more medications may be added to optimize HF regimen - Patient has been educated on basic disease state pathophysiology and goals of therapy  Medication Assistance / Insurance Benefits Check:  Does the patient have prescription insurance?    Type of insurance plan:   Does the patient qualify for medication assistance through manufacturers or grants? No   Outpatient Pharmacy:  Prior to admission outpatient pharmacy: [facility]   Is the patient willing to utilize a [facility] pharmacy at discharge?: Yes  Please do not hesitate to reach out with questions or concerns,  Enos Fling, PharmD, CPP, BCPS Heart Failure Pharmacist  Phone - [phone] 11/29/2022 7:45 AM

## 2022-11-29 NOTE — Progress Notes (Signed)
Progress Note   [name]:[id number] DOB: Nov 03, 1965 DOA: 11/19/2022     10 DOS: the patient was seen and examined on 11/29/2022       Subjective / Brief ROS:  Patient seen and examined at bedside this morning Underwent successful cardioversion today Denies chest pain nausea vomiting abdominal pain   Hospital course / significant events:  HPI: Casey Velez is a 57 y.o. male with medical history significant of morbid obesity, type 2 diabetes, hyperlipidemia presenting with SOB x1 month with exertion and productive cough - states nothing got worse for him to come in to ED but wants to get checked out   10/14: to ED, admitted to hospitalist service for Afib RVR, suspected CHF, started on diltiazem and heparin, cardiology consulted 10/15: Echocardiogram yesterday evening (+)LVEF 30-35% with global hypokinesis, mild LVH. RV mildly dilated with moderately reduced contraction and mod Pulm HTN (RVSP 50 mmHg). There is mild to moderate tricuspid and mild mitral regurgitation. CVP severely elevated. Planning ischemia eval outpatient. Plan DCCV when diuresed  10/16 -10/19: continue diuresis. Plan DCCV Monday 10/21. Net IO Since Admission: -14,865.77 mL [11/24/22 0907]. 10/19 UOP slowing somewhat  10/19: tachycardic but no symptoms  10/20: HR improved, UOP has leveled off, await DCCV tomorrow  10/21: TEE/DCCV today, briefly converted but then back to afib, continue on IV amiodarone and consider repeat cardioversion  10/22: planning repeat cardioversion Thursday 10/24   Consultants:  Cardiology    Procedures/Surgeries: 11/26/22 TEE, DC cardioversion - remains in Afib   ASSESSMENT & PLAN:   Atrial fibrillation with rapid ventricular response, new diagnosis, POA CHA2DS2-VASc of 2 (diabetes and CHF) Continue metoprolol 12.5 mg po q6h Plan of care discussed with cardiology Continue amiodarone drip today and we will transition to oral by tomorrow Continue digoxin Continue  Eliquis planning repeat cardioversion 10/24 Cardiologist on board and case discussed   Acute systolic congestive heart failure, new diagnosis, POA Echo showed EF of 30 to 35%  Continue metoprolol 12.5 mg po q6h Continue digoxin per cardiology  Continue Lasix Strict I&O, daily weight GDMT - BP limits use of ace/arb/arni/mra, and hold [id number] given repeat need for anesthesia/DCCV on Thursday.  Outpatient ischemic w/u    OSA on CPAP Continue CPAP at night   GERD without esophagitis Continue PPI therapyMorbid obesity (HCC) BMI in 50s weight loss with dieting lifestyle modification    Hyperlipidemia associated with type 2 diabetes mellitus (HCC) Continue statin therapy   Type 2 diabetes mellitus with morbid obesity (HCC) Continue insulin therapy Monitor glucose level daily       Morbid obesity based on BMI: Body mass index is 53.26 kg/m.    DVT prophylaxis: Eliquis IV fluids: no continuous IV fluids  Nutrition: cardiac/carb diet  Central lines / invasive devices: none   Code Status: FULL CODE  ACP documentation reviewed: 11/21/22 and none on file in [facility]   TOC needs: TBD Barriers to dispo / significant pending items: on amiodarone, possible repeat cardioversion pending     Family Communication: family at bedside on rounds     Examination:  Physical Exam Constitutional: Middle-age male sitting up in bed, not in acute distress Cardiovascular: S1-S2 present irregularly irregular rhythm Pulmonary:     Effort: Pulmonary effort is normal.     Breath sounds: Normal breath sounds.  Musculoskeletal: No edema noted Skin:    General: Skin is warm and dry.  Neurological:     General: No focal deficit present.     Mental Status:  He is alert and oriented to person, place, and time.  Psychiatric:        Mood and Affect: Mood normal.        Behavior: Behavior normal.       Data Reviewed: I have reviewed patient lab report as well as vitals as shown below, I have reviewed  nursing documentation, cardiologist documentation   Time spent: 44 minutes    Latest Ref Rng & Units 11/29/2022    3:45 AM 11/22/2022    3:35 AM 11/21/2022    5:24 AM  CBC  WBC 4.0 - 10.5 K/uL 10.6  10.6  10.0   Hemoglobin 13.0 - 17.0 g/dL 13.0  86.5  78.4   Hematocrit 39.0 - 52.0 % 49.8  44.6  42.5   Platelets 150 - 400 K/uL 294  245  234        Latest Ref Rng & Units 11/29/2022    3:45 AM 11/28/2022    4:37 AM 11/27/2022   10:17 AM  BMP  Glucose 70 - 99 mg/dL 696  98  295   BUN 6 - 20 mg/dL 26  32  35   Creatinine 0.61 - 1.24 mg/dL 2.84  1.32  4.40   Sodium 135 - 145 mmol/L 135  136  136   Potassium 3.5 - 5.1 mmol/L 3.7  3.6  3.8   Chloride 98 - 111 mmol/L 98  98  99   CO2 22 - 32 mmol/L 27  25  25    Calcium 8.9 - 10.3 mg/dL 8.9  9.6  9.3     Vitals:   11/29/22 1415 11/29/22 1430 11/29/22 1458 11/29/22 1640  BP: 114/73 97/86 92/62  106/81  Pulse:   73 71  Resp: 12 11 16 20   Temp:   97.7 F (36.5 C) 98 F (36.7 C)  TempSrc:   Oral Oral  SpO2: 93% 96% 96% 97%  Weight:      Height:         Author: Loyce Dys, MD 11/29/2022 5:14 PM  For on call review [email or web address].

## 2022-11-29 NOTE — Transfer of Care (Signed)
Immediate Anesthesia Transfer of Care Note  [name]  Procedure(s) Performed: CARDIOVERSION  Patient Location: [facility]   Anesthesia Type:General  Level of Consciousness: awake, drowsy, and patient cooperative  Airway & Oxygen Therapy: Patient Spontanous Breathing and Patient connected to nasal cannula oxygen  Post-op Assessment: Report given to RN and Post -op Vital signs reviewed and stable  Post vital signs: Reviewed and stable  Last Vitals:  Vitals Value Taken Time  BP    Temp    Pulse    Resp    SpO2      Last Pain:  Vitals:   11/29/22 1228  TempSrc: Oral  PainSc: 0-No pain      Patients Stated Pain Goal: 0 (11/25/22 2000)  Complications: No notable events documented.

## 2022-11-29 NOTE — Anesthesia Preprocedure Evaluation (Signed)
Anesthesia Evaluation  Patient identified by MRN, date of birth, ID band Patient awake    Reviewed: Allergy & Precautions, NPO status , Patient's Chart, lab work & pertinent test results  History of Anesthesia Complications Negative for: history of anesthetic complications  Airway Mallampati: III  TM Distance: >3 FB Neck ROM: full    Dental  (+) Chipped   Pulmonary sleep apnea    Pulmonary exam normal        Cardiovascular Exercise Tolerance: Good + DOE  + dysrhythmias Atrial Fibrillation  Rhythm:regular Rate:Tachycardia     Neuro/Psych negative neurological ROS  negative psych ROS   GI/Hepatic Neg liver ROS,GERD  Controlled,,  Endo/Other  diabetes, Type 2  Morbid obesity  Renal/GU negative Renal ROS  negative genitourinary   Musculoskeletal   Abdominal   Peds  Hematology negative hematology ROS (+)   Anesthesia Other Findings Past Medical History: No date: Acquired dilation of ascending aorta and aortic root (HCC)     Comment:  a. 11/2022 Echo: Asc Ao 42mm. No date: Allergy No date: Arthritis No date: Cardiomyopathy [facility])     Comment:  a. 11/2022 Echo (in setting of Afib): EF 30-35%, glob               HK, mod dil LV, mod reduced RV fxn, mild MR, mild to mod               TR, Asc Ao 42mm. No date: Colon polyps No date: Diabetes mellitus, type 2 (HCC) 04/15/2020: Fatigue No date: GERD (gastroesophageal reflux disease) No date: HFrEF (heart failure with reduced ejection fraction) (HCC)     Comment:  a. 11/2022 Echo (in setting of Afib): EF 30-35%. No date: Hydrocele, left 05/23/2020: Hyperlipidemia No date: Morbid obesity (HCC) No date: Persistent atrial fibrillation (HCC)     Comment:  a. 11/2022-->Failed DCCV-->Amio started; b. CHA2DS2VASc               = 2-->eliquis. 04/15/2020: Screening for malignant neoplasm of prostate No date: Sleep apnea No date: Vitamin D deficiency  Past Surgical  History: No date: APPENDECTOMY 11/26/2022: CARDIOVERSION; N/A     Comment:  Procedure: CARDIOVERSION;  Surgeon: Debbe Odea,               MD;  Location: [facility] ORS;  Service: Cardiovascular;                Laterality: N/A; 04/26/2017: COLONOSCOPY WITH PROPOFOL 05/27/2020: COLONOSCOPY WITH PROPOFOL; N/A     Comment:  Procedure: COLONOSCOPY WITH PROPOFOL;  Surgeon:               Pasty Spillers, MD;  Location: [facility] ENDOSCOPY;                Service: Endoscopy;  Laterality: N/A; 04/06/2021: HYDROCELE EXCISION; Left     Comment:  Procedure: HYDROCELECTOMY ADULT;  Surgeon: Orson Ape, MD;  Location: [facility] ORS;  Service: Urology;                Laterality: Left; 11/26/2022: TEE WITHOUT CARDIOVERSION; N/A     Comment:  Procedure: TRANSESOPHAGEAL ECHOCARDIOGRAM;  Surgeon:               Debbe Odea, MD;  Location: [facility] ORS;  Service:               Cardiovascular;  Laterality: N/A;  BMI    Body Mass Index:  46.08 kg/m      Reproductive/Obstetrics negative OB ROS                             Anesthesia Physical Anesthesia Plan  ASA: 4  Anesthesia Plan: General   Post-op Pain Management:    Induction: Intravenous  PONV Risk Score and Plan: Propofol infusion and TIVA  Airway Management Planned: Natural Airway and Nasal Cannula  Additional Equipment:   Intra-op Plan:   Post-operative Plan:   Informed Consent: I have reviewed the patients History and Physical, chart, labs and discussed the procedure including the risks, benefits and alternatives for the proposed anesthesia with the patient or authorized representative who has indicated his/her understanding and acceptance.     Dental Advisory Given  Plan Discussed with: Anesthesiologist, CRNA and Surgeon  Anesthesia Plan Comments: (Patient consented for risks of anesthesia including but not limited to:  - adverse reactions to medications - risk of airway placement if  required - damage to eyes, teeth, lips or other oral mucosa - nerve damage due to positioning  - sore throat or hoarseness - Damage to heart, brain, nerves, lungs, other parts of body or loss of life  Patient voiced understanding and assent.)       Anesthesia Quick Evaluation

## 2022-11-30 ENCOUNTER — Telehealth: Payer: Self-pay

## 2022-11-30 ENCOUNTER — Other Ambulatory Visit: Payer: Self-pay

## 2022-11-30 ENCOUNTER — Encounter: Payer: Self-pay | Admitting: Cardiovascular Disease

## 2022-11-30 DIAGNOSIS — I5021 Acute systolic (congestive) heart failure: Secondary | ICD-10-CM | POA: Diagnosis not present

## 2022-11-30 DIAGNOSIS — I4891 Unspecified atrial fibrillation: Secondary | ICD-10-CM

## 2022-11-30 LAB — BASIC METABOLIC PANEL
Anion gap: 11 (ref 5–15)
BUN: 25 mg/dL — ABNORMAL HIGH (ref 6–20)
CO2: 25 mmol/L (ref 22–32)
Calcium: 8.8 mg/dL — ABNORMAL LOW (ref 8.9–10.3)
Chloride: 99 mmol/L (ref 98–111)
Creatinine, Ser: 1.07 mg/dL (ref 0.61–1.24)
GFR, Estimated: >60 mL/min (ref >60)
Glucose, Bld: 103 mg/dL — ABNORMAL HIGH (ref 70–99)
Potassium: 3.5 mmol/L (ref 3.5–5.1)
Sodium: 135 mmol/L (ref 135–145)

## 2022-11-30 LAB — CBC WITH DIFFERENTIAL/PLATELET
Abs Immature Granulocytes: 0.04 10*3/uL (ref 0.00–0.07)
Basophils Absolute: 0.1 10*3/uL (ref 0.0–0.1)
Basophils Relative: 1 %
Eosinophils Absolute: 0.2 10*3/uL (ref 0.0–0.5)
Eosinophils Relative: 2 %
HCT: 48.1 % (ref 39.0–52.0)
Hemoglobin: 15.8 g/dL (ref 13.0–17.0)
Immature Granulocytes: 0 %
Lymphocytes Relative: 25 %
Lymphs Abs: 2.5 10*3/uL (ref 0.7–4.0)
MCH: 28.4 pg (ref 26.0–34.0)
MCHC: 32.8 g/dL (ref 30.0–36.0)
MCV: 86.5 fL (ref 80.0–100.0)
Monocytes Absolute: 0.9 10*3/uL (ref 0.1–1.0)
Monocytes Relative: 8 %
Neutro Abs: 6.6 10*3/uL (ref 1.7–7.7)
Neutrophils Relative %: 64 %
Platelets: 253 10*3/uL (ref 150–400)
RBC: 5.56 MIL/uL (ref 4.22–5.81)
RDW: 13.2 % (ref 11.5–15.5)
WBC: 10.3 10*3/uL (ref 4.0–10.5)
nRBC: 0 % (ref 0.0–0.2)

## 2022-11-30 LAB — GLUCOSE, CAPILLARY
Glucose-Capillary: 128 mg/dL — ABNORMAL HIGH (ref 70–99)
Glucose-Capillary: 105 mg/dL — ABNORMAL HIGH (ref 70–99)

## 2022-11-30 MED ORDER — METOPROLOL SUCCINATE ER 25 MG PO TB24: 25.0000 mg | ORAL_TABLET | Freq: Two times a day (BID) | ORAL | 2 refills | Status: DC

## 2022-11-30 MED ORDER — AMIODARONE HCL 200 MG PO TABS: 400.0000 mg | ORAL_TABLET | Freq: Two times a day (BID) | ORAL | Status: DC

## 2022-11-30 MED ORDER — FUROSEMIDE 40 MG PO TABS: 40.0000 mg | ORAL_TABLET | Freq: Every day | ORAL | 2 refills | Status: DC

## 2022-11-30 MED ORDER — AMIODARONE HCL 200 MG PO TABS
200.0000 mg | ORAL_TABLET | Freq: Every day | ORAL | Status: DC
Start: 2022-12-05 — End: 2022-11-30

## 2022-11-30 MED ORDER — METOPROLOL SUCCINATE ER 25 MG PO TB24: 25.0000 mg | ORAL_TABLET | Freq: Two times a day (BID) | ORAL | Status: DC

## 2022-11-30 MED ORDER — EMPAGLIFLOZIN 10 MG PO TABS
10.0000 mg | ORAL_TABLET | Freq: Every day | ORAL | Status: DC
  Administered 2022-11-30: 10 mg via ORAL
  Filled 2022-11-30: qty 1

## 2022-11-30 MED ORDER — AMIODARONE HCL 200 MG PO TABS: ORAL_TABLET | ORAL | 0 refills | Status: DC

## 2022-11-30 NOTE — Telephone Encounter (Signed)
Patient scheduled for follow up with APP on 11/08, referral to EP ordered.   Thanks!

## 2022-11-30 NOTE — Plan of Care (Signed)
Care plan complete

## 2022-11-30 NOTE — TOC Progression Note (Signed)
Transition of Care [facility]) - Progression Note    Patient Details  Name: Casey Velez MRN: [id number] Date of Birth: 1965/05/12  Transition of Care [facility]) CM/SW Contact  Darolyn Rua, [place] Phone Number: 11/30/2022, 12:07 PM  Clinical Narrative:      10/25:  TOC continues to follow for needs, please consult should needs arise.    10/18: Patient from home presenting with afib with rvr, no identified toc needs. Please consult TOC should needs arise.    PCP Aura Dials, NP Insurance: [facility]       Expected Discharge Plan and Services                                               Social Determinants of Health (SDOH) Interventions SDOH Screenings   Food Insecurity: No Food Insecurity (11/19/2022)  Housing: Low Risk  (11/19/2022)  Transportation Needs: No Transportation Needs (11/19/2022)  Utilities: Not At Risk (11/19/2022)  Alcohol Screen: Low Risk  (04/15/2020)  Depression (PHQ2-9): Low Risk  (10/25/2022)  Tobacco Use: Medium Risk (11/19/2022)    Readmission Risk Interventions     No data to display

## 2022-11-30 NOTE — Telephone Encounter (Signed)
-----   Message from [place] sent at 11/30/2022  1:23 PM EDT ----- Regarding: Hospital follow-up Good afternoon again,  Could you arrange for Mr. Haizlip to follow-up with Dr. Kirke Corin or an APP in 1 to 2 weeks?  Can we also place a referral to electrophysiology for management of atrial fibrillation?  Let me know if any questions or concerns arise.  Thanks.  Thayer Ohm

## 2022-11-30 NOTE — Progress Notes (Signed)
   Patient Name: Casey Velez Date of Encounter: 11/30/2022 [facility] Cardiologist: Lorine Bears, MD   Interval Summary  .    Feels well without chest pain or shortness of breath.  Underwent successful cardioversion yesterday.  Vital Signs .    Vitals:   11/30/22 0406 11/30/22 0412 11/30/22 0756 11/30/22 1154  BP: 103/82  (!) 112/91 (!) 108/95  Pulse: 67 70 70 78  Resp: 18  20 20   Temp: 97.6 F (36.4 C)  (!) 97.5 F (36.4 C) 97.9 F (36.6 C)  TempSrc:   Oral Oral  SpO2: 100%  98% 98%  Weight:      Height:       No intake or output data in the 24 hours ending 11/30/22 1310    11/29/2022   12:28 PM 11/29/2022    7:49 AM 11/26/2022    8:04 AM  Last 3 Weights  Weight (lbs) 285 lb 7.9 oz 285 lb 8 oz 286 lb  Weight (kg) 129.5 kg 129.502 kg 129.729 kg      Telemetry/ECG    Atrial fibrillation up until cardioversion yesterday.  He has been maintaining sinus rhythm since then. - Personally Reviewed  Physical Exam .   GEN: No acute distress.   Neck: Unable to assess JVP due to body habitus. Cardiac: RRR, no murmurs, rubs, or gallops.  Respiratory: Clear to auscultation bilaterally. GI: Soft, nontender, non-distended  MS: No edema  Assessment & Plan .     Persistent atrial fibrillation: Mr. Gan underwent successful repeat cardioversion yesterday after being loaded with IV amiodarone.  He is maintaining sinus rhythm today.  I will transition him to oral amiodarone to complete a 10 gm load.  He should take amiodarone 400 mg twice daily x 5 days, then decrease to 200 mg daily thereafter.  He should remain on apixaban 5 mg twice daily for stroke prophylaxis.  Outpatient EP consultation should be considered, particularly given his young age and unfavorable long-term side effect profile of amiodarone.  Outpatient sleep evaluation recommended as well as ongoing weight loss.  Acute HFrEF: Mr. Fifield appears euvolemic.  I suspect his cardiomyopathy is tachycardia  mediated in the setting of his persistent atrial fibrillation, though he has risk factors for ischemic substrate as well.  Repeat echocardiogram in about 6 weeks should be considered.  Based on results, noninvasive versus invasive ischemia evaluation should be considered.  In regard to his heart failure medications, I will switch metoprolol to tartrate to metoprolol succinate 25 mg twice daily.  Continue furosemide 40 mg p.o. daily.  Blood pressure has remained soft, precluding addition of an ACE inhibitor/ARB.  Addition of 1 of these agents as well as aldosterone antagonist should be considered at discharge.  I will resume empagliflozin.  Mr. Or is stable for discharge home.  We will arrange for him to follow-up with [place] in 1 to 2 weeks.  For questions or updates, please contact [facility] Please consult [email or web address] for contact info under [facility].     Signed, Yvonne Kendall, MD

## 2022-11-30 NOTE — Discharge Summary (Signed)
Physician Discharge Summary   [name] MRN: [id number] DOB: September 18, 1965  Admit date:     11/19/2022  Discharge date: 11/30/22  Discharge Physician: Loyce Dys   PCP: Marjie Skiff, NP   Recommendations at discharge:  Follow-up with cardiology   Discharge Diagnoses: Atrial fibrillation with rapid ventricular response, new diagnosis, POA CHA2DS2-VASc of 2 (diabetes and CHF) Acute systolic congestive heart failure, new diagnosis, POa OSA on CPAP GERD without esophagitis Morbid obesity Hyperlipidemia associated with type 2 diabetes mellitus (HCC) Type 2 diabetes mellitus with morbid obesity (HCC) Morbid obesity based on BMI: Body mass index is 53.26 kg/m.      Hospital Course Casey Velez is a 57 y.o. male with medical history significant of morbid obesity, type 2 diabetes, hyperlipidemia presenting with SOB x1 month with exertion and productive cough - states nothing got worse for him to come in to ED but wants to get checked out   10/14: to ED, admitted to hospitalist service for Afib RVR, suspected CHF, started on diltiazem and heparin, cardiology consulted 10/15: Echocardiogram yesterday evening (+)LVEF 30-35% with global hypokinesis, mild LVH. RV mildly dilated with moderately reduced contraction and mod Pulm HTN (RVSP 50 mmHg). There is mild to moderate tricuspid and mild mitral regurgitation. CVP severely elevated. Planning ischemia eval outpatient. Plan DCCV when diuresed  10/16 -10/19: continue diuresis. Plan DCCV Monday 10/21. Net IO Since Admission: -14,865.77 mL [11/24/22 0907]. 10/19 UOP slowing somewhat  10/19: tachycardic but no symptoms  10/20: HR improved, UOP has leveled off, await DCCV tomorrow  10/21: TEE/DCCV today, briefly converted but then back to afib, continue on IV amiodarone and consider repeat cardioversion  10/22: planning repeat cardioversion Thursday 10/24 Patient underwent successful repeat cardioversion. IV amiodarone has  been switched to oral by cardiologist and cleared for discharge today to follow-up as an outpatient   Consultants: Cardiology Procedures performed: As mentioned above Disposition: Home Diet recommendation:  Cardiac diet DISCHARGE MEDICATION: Allergies as of 11/30/2022   No Known Allergies      Medication List     STOP taking these medications    loratadine 10 MG tablet Commonly known as: CLARITIN       TAKE these medications    albuterol 108 (90 Base) MCG/ACT inhaler Commonly known as: VENTOLIN HFA Inhale 2 puffs into the lungs every 6 (six) hours as needed.   amiodarone 200 MG tablet Commonly known as: PACERONE Take 2 tablets (400 mg total) by mouth 2 (two) times daily for 5 days, THEN 1 tablet (200 mg total) daily. Start taking on: November 30, 2022   aspirin EC 81 MG tablet Take 81 mg by mouth daily. Swallow whole.   atorvastatin 80 MG tablet Commonly known as: LIPITOR Take 1 tablet (80 mg total) by mouth daily.   blood glucose meter kit and supplies Dispense based on patient and insurance preference. Check glucose in the morning fasting and before meals. Use up to four times daily as directed. (FOR ICD-10 E10.9, E11.9).   Eliquis 5 MG Tabs tablet Generic drug: apixaban Take 1 tablet (5 mg total) by mouth 2 (two) times daily.   famotidine 20 MG tablet Commonly known as: PEPCID Take 20 mg by mouth daily.   furosemide 40 MG tablet Commonly known as: LASIX Take 1 tablet (40 mg total) by mouth daily. Start taking on: December 01, 2022   Jardiance 10 MG Tabs tablet Generic drug: empagliflozin TAKE 1 TABLET(10 MG) BY MOUTH DAILY   metoprolol succinate 25  MG 24 hr tablet Commonly known as: TOPROL-XL Take 1 tablet (25 mg total) by mouth 2 (two) times daily.   multivitamin with minerals Tabs tablet Take 1 tablet by mouth daily.        Follow-up Information     End, Cristal Deer, MD. Schedule an appointment as soon as possible for a visit.    Specialty: Cardiology Contact information: [address] [place] [address] [phone]                Discharge Exam: Ceasar Mons Weights   11/26/22 0804 11/29/22 0749 11/29/22 1228  Weight: 129.7 kg 129.5 kg 129.5 kg   Constitutional: Middle-age male sitting up in bed, not in acute distress Cardiovascular: S1-S2 present irregularly irregular rhythm Pulmonary:     Effort: Pulmonary effort is normal.     Breath sounds: Normal breath sounds.  Musculoskeletal: No edema noted Skin:    General: Skin is warm and dry.  Neurological:     General: No focal deficit present.     Mental Status: He is alert and oriented to person, place, and time.  Psychiatric:        Mood and Affect: Mood normal.        Behavior: Behavior normal.   Condition at discharge: good  The results of significant diagnostics from this hospitalization (including imaging, microbiology, ancillary and laboratory) are listed below for reference.   Imaging Studies: ECHO TEE  Result Date: 11/27/2022    TRANSESOPHOGEAL ECHO REPORT   Patient Name:   Casey Velez Date of Exam: 11/26/2022 Medical Rec #:  [id number]      Height:       66.0 in Accession #:    [id number]     Weight:       286.0 lb Date of Birth:  04-30-1965     BSA:          2.328 m Patient Age:    56 years       BP:           119/90 mmHg Patient Gender: M              HR:           82 bpm. Exam Location:  [facility] Procedure: Transesophageal Echo, Color Doppler and Cardiac Doppler Indications:     Atrial Fibrillation  History:         Patient has prior history of Echocardiogram examinations, most                  recent 11/20/2022. Risk Factors:Dyslipidemia. Persistent A-fib.  Sonographer:     Cristela Blue Referring Phys:  [id number] SHERI HAMMOCK Diagnosing Phys: Debbe Odea MD PROCEDURE: The transesophogeal probe was passed without difficulty through the esophogus of the patient. Sedation performed by different physician. The patient  developed no complications during the procedure. An unsuccessful direct current cardioversion was performed at 200 joules with 3 attempts.  IMPRESSIONS  1. Left ventricular ejection fraction, by estimation, is 30%. The left ventricle has moderate to severely decreased function.  2. Right ventricular systolic function is moderately reduced. The right ventricular size is normal.  3. Left atrial size was moderately dilated. No left atrial/left atrial appendage thrombus was detected.  4. Right atrial size was mildly dilated.  5. The mitral valve is normal in structure. Mild mitral valve regurgitation.  6. The aortic valve is tricuspid. Aortic valve regurgitation is not visualized. Aortic valve sclerosis is present, with no evidence of aortic  valve stenosis. FINDINGS  Left Ventricle: Left ventricular ejection fraction, by estimation, is 30%. The left ventricle has moderate to severely decreased function. The left ventricular internal cavity size was normal in size. Right Ventricle: The right ventricular size is normal. No increase in right ventricular wall thickness. Right ventricular systolic function is moderately reduced. Left Atrium: Left atrial size was moderately dilated. No left atrial/left atrial appendage thrombus was detected. Right Atrium: Right atrial size was mildly dilated. Pericardium: There is no evidence of pericardial effusion. Mitral Valve: The mitral valve is normal in structure. Mild mitral valve regurgitation. Tricuspid Valve: The tricuspid valve is normal in structure. Tricuspid valve regurgitation is trivial. Aortic Valve: The aortic valve is tricuspid. Aortic valve regurgitation is not visualized. Aortic valve sclerosis is present, with no evidence of aortic valve stenosis. Pulmonic Valve: The pulmonic valve was normal in structure. Pulmonic valve regurgitation is not visualized. Aorta: The aortic root is normal in size and structure. IAS/Shunts: No atrial level shunt detected by color flow  Doppler. Debbe Odea MD Electronically signed by Debbe Odea MD Signature Date/Time: 11/27/2022/10:06:18 AM    Final    ECHOCARDIOGRAM COMPLETE  Result Date: 11/20/2022    ECHOCARDIOGRAM REPORT   Patient Name:   Casey Velez Date of Exam: 11/19/2022 Medical Rec #:  [id number]      Height:       66.0 in Accession #:    [id number]     Weight:       330.0 lb Date of Birth:  Jun 21, 1965     BSA:          2.474 m Patient Age:    56 years       BP:           119/97 mmHg Patient Gender: M              HR:           106 bpm. Exam Location:  [facility] Procedure: 2D Echo, Cardiac Doppler, Color Doppler and Intracardiac            Opacification Agent Indications:     I48.91 Atrial Fibrillation  History:         Patient has no prior history of Echocardiogram examinations.                  Signs/Symptoms:Fatigue; Risk Factors:Dyslipidemia, Sleep Apnea                  and Diabetes.  Sonographer:     Daphine Deutscher RDCS Referring Phys:  [id number] Francoise Schaumann NEWTON Diagnosing Phys: Yvonne Kendall MD IMPRESSIONS  1. Left ventricular ejection fraction, by estimation, is 30 to 35%. The left ventricle has moderate to severely decreased function. The left ventricle demonstrates global hypokinesis. The left ventricular internal cavity size was moderately dilated. There is mild left ventricular hypertrophy. Left ventricular diastolic function could not be evaluated.  2. Right ventricular systolic function is moderately reduced. The right ventricular size is mildly enlarged. There is moderately elevated pulmonary artery systolic pressure.  3. Left atrial size was mildly dilated.  4. A small pericardial effusion is present.  5. The mitral valve is normal in structure. Mild mitral valve regurgitation.  6. Tricuspid valve regurgitation is mild to moderate.  7. The aortic valve is tricuspid. Aortic valve regurgitation is not visualized. No aortic stenosis is present.  8. There is mild dilatation of the ascending aorta,  measuring 42 mm.  9. The inferior vena cava is dilated  in size with <50% respiratory variability, suggesting right atrial pressure of 15 mmHg. FINDINGS  Left Ventricle: Left ventricular ejection fraction, by estimation, is 30 to 35%. The left ventricle has moderate to severely decreased function. The left ventricle demonstrates global hypokinesis. Definity contrast agent was given IV to delineate the left ventricular endocardial borders. The left ventricular internal cavity size was moderately dilated. There is mild left ventricular hypertrophy. Left ventricular diastolic function could not be evaluated due to atrial fibrillation. Left ventricular diastolic function could not be evaluated. Right Ventricle: The right ventricular size is mildly enlarged. No increase in right ventricular wall thickness. Right ventricular systolic function is moderately reduced. There is moderately elevated pulmonary artery systolic pressure. The tricuspid regurgitant velocity is 2.94 m/s, and with an assumed right atrial pressure of 15 mmHg, the estimated right ventricular systolic pressure is 49.6 mmHg. Left Atrium: Left atrial size was mildly dilated. Right Atrium: Right atrial size was normal in size. Pericardium: A small pericardial effusion is present. Mitral Valve: The mitral valve is normal in structure. Mild mitral valve regurgitation. Tricuspid Valve: The tricuspid valve is not well visualized. Tricuspid valve regurgitation is mild to moderate. Aortic Valve: The aortic valve is tricuspid. Aortic valve regurgitation is not visualized. No aortic stenosis is present. Aortic valve mean gradient measures 4.2 mmHg. Aortic valve peak gradient measures 7.3 mmHg. Aortic valve area, by VTI measures 2.07 cm. Pulmonic Valve: The pulmonic valve was not well visualized. Pulmonic valve regurgitation is trivial. No evidence of pulmonic stenosis. Aorta: The aortic root is normal in size and structure. There is mild dilatation of the ascending  aorta, measuring 42 mm. Pulmonary Artery: The pulmonary artery is not well seen. Venous: The inferior vena cava is dilated in size with less than 50% respiratory variability, suggesting right atrial pressure of 15 mmHg. IAS/Shunts: No atrial level shunt detected by color flow Doppler.  LEFT VENTRICLE PLAX 2D LVIDd:         6.20 cm LVIDs:         4.70 cm LV PW:         1.10 cm LV IVS:        1.04 cm LVOT diam:     2.10 cm LV SV:         38 LV SV Index:   16 LVOT Area:     3.46 cm  RIGHT VENTRICLE            IVC RV Basal diam:  4.70 cm    IVC diam: 2.70 cm RV S prime:     7.51 cm/s TAPSE (M-mode): 1.6 cm LEFT ATRIUM             Index        RIGHT ATRIUM           Index LA diam:        5.50 cm 2.22 cm/m   RA Area:     15.00 cm LA Vol (A2C):   67.2 ml 27.16 ml/m  RA Volume:   41.50 ml  16.77 ml/m LA Vol (A4C):   84.7 ml 34.23 ml/m LA Biplane Vol: 75.5 ml 30.52 ml/m  AORTIC VALVE AV Area (Vmax):    2.11 cm AV Area (Vmean):   2.05 cm AV Area (VTI):     2.07 cm AV Vmax:           134.75 cm/s AV Vmean:          95.840 cm/s AV VTI:  0.185 m AV Peak Grad:      7.3 mmHg AV Mean Grad:      4.2 mmHg LVOT Vmax:         82.23 cm/s LVOT Vmean:        56.667 cm/s LVOT VTI:          0.111 m LVOT/AV VTI ratio: 0.60  AORTA Ao Root diam: 3.60 cm Ao Asc diam:  4.20 cm MV E velocity: 92.98 cm/s  TRICUSPID VALVE                            TR Peak grad:   34.6 mmHg                            TR Vmax:        294.00 cm/s                             SHUNTS                            Systemic VTI:  0.11 m                            Systemic Diam: 2.10 cm Yvonne Kendall MD Electronically signed by Yvonne Kendall MD Signature Date/Time: 11/20/2022/6:55:25 AM    Final    DG Chest Port 1 View  Result Date: 11/19/2022 CLINICAL DATA:  57 year old male with history of shortness of breath. EXAM: PORTABLE CHEST 1 VIEW COMPARISON:  Chest x-ray 05/08/2017. FINDINGS: Lung volumes are normal. No consolidative airspace disease. No  pleural effusions. No pneumothorax. No evidence of pulmonary edema. Enlargement of the cardiopericardial silhouette, new compared to the prior study. Upper mediastinal contours are within normal limits. IMPRESSION: New enlargement of the cardiopericardial silhouette, which could reflect cardiomegaly and/or underlying pericardial effusion. Further evaluation with echocardiography is suggested if clinically appropriate. Electronically Signed   By: Trudie Reed M.D.   On: 11/19/2022 07:15    Microbiology: Results for orders placed or performed in visit on 07/22/20  Microscopic Examination     Status: None   Collection Time: 07/22/20  8:41 AM   Urine  Result Value Ref Range Status   WBC, UA 0-5 0 - 5 /hpf Final   RBC, Urine 0-2 0 - 2 /hpf Final   Epithelial Cells (non renal) None seen 0 - 10 /hpf Final   Bacteria, UA None seen None seen/Few Final    Labs: CBC: Recent Labs  Lab 11/29/22 0345 11/30/22 0450  WBC 10.6* 10.3  NEUTROABS 6.3 6.6  HGB 16.4 15.8  HCT 49.8 48.1  MCV 86.3 86.5  PLT 294 253   Basic Metabolic Panel: Recent Labs  Lab 11/24/22 0439 11/25/22 0425 11/26/22 0412 11/27/22 1017 11/28/22 0437 11/29/22 0345 11/30/22 0450  NA 136   < > 136 136 136 135 135  K 4.1   < > 4.0 3.8 3.6 3.7 3.5  CL 100   < > 99 99 98 98 99  CO2 25   < > 24 25 25 27 25   GLUCOSE 105*   < > 99 129* 98 105* 103*  BUN 22*   < > 33* 35* 32* 26* 25*  CREATININE 0.93   < > 1.09 1.22 1.15 1.11 1.07  CALCIUM 9.4   < > 9.6 9.3 9.6 8.9 8.8*  MG 2.4  --   --   --  2.5*  --   --    < > = values in this interval not displayed.   Liver Function Tests: No results for input(s): "AST", "ALT", "ALKPHOS", "BILITOT", "PROT", "ALBUMIN" in the last 168 hours. CBG: Recent Labs  Lab 11/29/22 1233 11/29/22 1558 11/29/22 2105 11/30/22 0759 11/30/22 1151  GLUCAP 346* 104* 112* 128* 105*    Discharge time spent:  37 minutes.  Signed: Loyce Dys, MD [facility] 11/30/2022

## 2022-12-03 ENCOUNTER — Telehealth: Payer: Self-pay

## 2022-12-03 NOTE — Transitions of Care (Post Inpatient/ED Visit) (Signed)
12/03/2022  Name: Casey Velez MRN: [id number] DOB: 03-Jan-1966  Today's TOC FU Call Status: Today's TOC FU Call Status:: Successful TOC FU Call Completed TOC FU Call Complete Date: 12/03/22 Patient's Name and Date of Birth confirmed.  Transition Care Management Follow-up Telephone Call Date of Discharge: 11/30/22 Discharge Facility: [facility]) Type of Discharge: Inpatient Admission Primary Inpatient Discharge Diagnosis:: A-fib How have you been since you were released from the hospital?: Better Any questions or concerns?: No  Items Reviewed: Did you receive and understand the discharge instructions provided?: Yes Medications obtained,verified, and reconciled?: Yes (Medications Reviewed) Any new allergies since your discharge?: No Dietary orders reviewed?: NA Do you have support at home?: Yes People in Home: significant other Name of Support/Comfort Primary Source: Gaylyn Lambert  Medications Reviewed Today: Medications Reviewed Today     Reviewed by Redge Gainer, RN (Case Manager) on 12/03/22 at 1445  Med List Status: <None>   Medication Order Taking? Sig Documenting Provider Last Dose Status Informant  albuterol (VENTOLIN HFA) 108 (90 Base) MCG/ACT inhaler [id number] No Inhale 2 puffs into the lungs every 6 (six) hours as needed. Aura Dials T, NP prn unk Active   amiodarone (PACERONE) 200 MG tablet [id number]  Take 2 tablets (400 mg total) by mouth 2 (two) times daily for 5 days, THEN 1 tablet (200 mg total) daily. Loyce Dys, MD  Active   apixaban (ELIQUIS) 5 MG TABS tablet [id number]  Take 1 tablet (5 mg total) by mouth 2 (two) times daily. Charlsie Quest, NP  Active   aspirin EC 81 MG tablet [id number] No Take 81 mg by mouth daily. Swallow whole. [provider] prn unk Active Self  atorvastatin (LIPITOR) 80 MG tablet [id number] No Take 1 tablet (80 mg total) by mouth daily. Aura Dials T, NP Past Week Active   blood  glucose meter kit and supplies [id number] No Dispense based on patient and insurance preference. Check glucose in the morning fasting and before meals. Use up to four times daily as directed. (FOR ICD-10 E10.9, E11.9). Flinchum, Eula Fried, FNP Taking Active Self  empagliflozin (JARDIANCE) 10 MG TABS tablet [id number] No TAKE 1 TABLET(10 MG) BY MOUTH DAILY Cannady, Jolene T, NP Past Week Active   famotidine (PEPCID) 20 MG tablet [id number] No Take 20 mg by mouth daily. [provider] prn unk Active Self  furosemide (LASIX) 40 MG tablet [id number]  Take 1 tablet (40 mg total) by mouth daily. Loyce Dys, MD  Active   metoprolol succinate (TOPROL-XL) 25 MG 24 hr tablet [id number]  Take 1 tablet (25 mg total) by mouth 2 (two) times daily. Loyce Dys, MD  Active   Multiple Vitamin (MULTIVITAMIN WITH MINERALS) TABS tablet [id number] No Take 1 tablet by mouth daily. [provider] Past Week Active Self  Med List Note Raynelle Chary, CPhT 03/28/21 1429): Pt has CPAP Machine             Home Care and Equipment/Supplies: Were Home Health Services Ordered?: NA Any new equipment or medical supplies ordered?: NA  Functional Questionnaire: Do you need assistance with bathing/showering or dressing?: No Do you need assistance with meal preparation?: No Do you need assistance with eating?: No Do you have difficulty maintaining continence: No Do you need assistance with getting out of bed/getting out of a chair/moving?: No Do you have difficulty managing or taking your medications?: No  Follow up appointments reviewed: PCP Follow-up appointment confirmed?: Yes Date of PCP follow-up  appointment?: 12/14/22 Follow-up Provider: [facility] Follow-up appointment confirmed?: Yes Date of Specialist follow-up appointment?: 12/14/22 Follow-Up Specialty Provider:: Carlos Levering Do you need transportation to your follow-up appointment?: No Do you understand  care options if your condition(s) worsen?: Yes-patient verbalized understanding  SDOH Interventions Today    Flowsheet Row Most Recent Value  SDOH Interventions   Food Insecurity Interventions Intervention Not Indicated  Transportation Interventions Intervention Not Indicated      Contacted the patient today. He declines enrollment in the 30 day Outreach program.  Deidre Ala, RN RN Care Manager [facility] [phone]

## 2022-12-09 NOTE — Patient Instructions (Addendum)
- Reminded to call for an overnight weight gain of >2 pounds or a weekly weight gain of >5 pounds - not adding salt to food and read food labels. Reviewed the importance of keeping daily sodium intake to 2000mg  daily.  - No Ibuprofen - No cold medications that have a D such as Claritin D   Be Involved in Caring For Your Health:  Taking Medications When medications are taken as directed, they can greatly improve your health. But if they are not taken as prescribed, they may not work. In some cases, not taking them correctly can be harmful. To help ensure your treatment remains effective and safe, understand your medications and how to take them. Bring your medications to each visit for review by your provider.  Your lab results, notes, and after visit summary will be available on My Chart. We strongly encourage you to use this feature. If lab results are abnormal the clinic will contact you with the appropriate steps. If the clinic does not contact you assume the results are satisfactory. You can always view your results on My Chart. If you have questions regarding your health or results, please contact the clinic during office hours. You can also ask questions on My Chart.  We at [facility] are grateful that you chose [place] to provide your care. We strive to provide evidence-based and compassionate care and are always looking for feedback. If you get a survey from the clinic please complete this so we can hear your opinions.  Heart Failure Eating Plan Heart failure, also called congestive heart failure, occurs when your heart does not pump blood well enough to meet your body's needs for oxygen-rich blood. Heart failure is a long-term (chronic) condition. Living with heart failure can be challenging. Following your health care provider's instructions about a healthy lifestyle and working with a dietitian to choose the right foods may help to improve your symptoms. An eating plan for someone  with heart failure will include changes that limit the intake of salt (sodium) and unhealthy fat. What are tips for following this plan? Reading food labels Check food labels for the amount of sodium per serving. Choose foods that have less than 140 mg (milligrams) of sodium in each serving. Check food labels for the number of calories per serving. This is important if you need to limit your daily calorie intake to lose weight. Check food labels for the serving size. If you eat more than one serving, you will be eating more sodium and calories than what is listed on the label. Look for foods that are labeled as "sodium-free," "very low sodium," or "low sodium." Foods labeled as "reduced sodium" or "lightly salted" may still have more sodium than what is recommended for you. Cooking Avoid adding salt when cooking. Ask your health care provider or dietitian before using salt substitutes. Season food with salt-free seasonings, spices, or herbs. Check the label of seasoning mixes to make sure they do not contain salt. Cook with heart-healthy oils, such as olive, canola, soybean, or sunflower oil. Do not fry foods. Cook foods using low-fat methods, such as baking, boiling, grilling, and broiling. Limit unhealthy fats when cooking by: Removing the skin from poultry, such as chicken. Removing all visible fats from meats. Skimming the fat off from stews, soups, and gravies before serving them. Meal planning  Limit your intake of: Processed, canned, or prepackaged foods. Foods that are high in trans fat, such as fried foods. Sweets, desserts, sugary drinks, and  other foods with added sugar. Full-fat dairy products, such as whole milk. Eat a balanced diet. This may include: 4-5 servings of fruit each day and 4-5 servings of vegetables each day. At each meal, try to fill one-half of your plate with fruits and vegetables. Up to 6-8 servings of whole grains each day. Up to 2 servings of lean meat,  poultry, or fish each day. One serving of meat is equal to 3 oz (85 g). This is about the same size as a deck of cards. 2 servings of low-fat dairy each day. Heart-healthy fats. Healthy fats called omega-3 fatty acids are found in foods such as flaxseed and cold-water fish like sardines, salmon, and mackerel. Aim to eat 25-35 g (grams) of fiber a day. Foods that are high in fiber include apples, broccoli, carrots, beans, peas, and whole grains. Do not add salt or condiments that contain salt (such as soy sauce) to foods before eating. When eating at a restaurant, ask that your food be prepared with less salt or no salt, if possible. Try to eat 2 or more vegetarian meals each week. Eat more home-cooked food and eat less restaurant, buffet, and fast food. General information Do not eat more than 2,300 mg of sodium a day. The amount of sodium that is recommended for you may be lower, depending on your condition. Maintain a healthy body weight as directed. Ask your health care provider what a healthy weight is for you. Check your weight every day. Work with your health care provider and dietitian to make a plan that is right for you to lose weight or maintain your current weight. Limit how much fluid you drink. Ask your health care provider or dietitian how much fluid you can have each day. Limit or avoid alcohol as told by your health care provider or dietitian. Recommended foods Fruits All fresh, frozen, and canned fruits. Dried fruits, such as raisins, prunes, and cranberries. Vegetables All fresh vegetables. Vegetables that are frozen without sauce or added salt. Low-sodium or sodium-free canned vegetables. Grains Bread with less than 80 mg of sodium per slice. Whole-wheat pasta, quinoa, and brown rice. Oats and oatmeal. Barley. Millet. Grits and cream of wheat. Whole-grain and whole-wheat cold cereal. Meats and other protein foods Lean cuts of meat. Skinless chicken and [place]. Fish with high  omega-3 fatty acids, such as salmon, sardines, and other cold-water fishes. Eggs. Dried beans, peas, and edamame. Unsalted nuts and nut butters. Dairy Low-fat or nonfat (skim) milk and dried milk. Rice milk, soy milk, and almond milk. Low-fat or nonfat yogurt. Small amounts of reduced-sodium block cheese. Low-sodium cottage cheese. Fats and oils Olive, canola, soybean, flaxseed, avocado, or sunflower oil. Sweets and desserts Applesauce. Granola bars. Sugar-free pudding and gelatin. Frozen fruit bars. Seasoning and other foods Fresh and dried herbs. Lemon or lime juice. Vinegar. Low-sodium ketchup. Salt-free marinades, salad dressings, sauces, and seasonings. The items listed above may not be a complete list of foods and beverages you can eat. Contact a dietitian for more information. Foods to avoid Fruits Fruits that are dried with sodium-containing preservatives. Vegetables Canned vegetables. Frozen vegetables with sauce or seasonings. Creamed vegetables. [place] fries. Onion rings. Pickled vegetables and sauerkraut. Grains Bread with more than 80 mg of sodium per slice. Hot or cold cereal with more than 140 mg sodium per serving. Salted pretzels and crackers. Prepackaged breadcrumbs. Bagels, croissants, and biscuits. Meats and other protein foods Ribs and chicken wings. Bacon, ham, pepperoni, bologna, salami, and packaged luncheon meats.  Hot dogs, bratwurst, and sausage. Canned meat. Smoked meat and fish. Salted nuts and seeds. Dairy Whole milk, half-and-half, and cream. Buttermilk. Processed cheese, cheese spreads, and cheese curds. Regular cottage cheese. Feta cheese. Shredded cheese. String cheese. Fats and oils Butter, lard, shortening, ghee, and bacon fat. Canned and packaged gravies. Seasoning and other foods Onion salt, garlic salt, table salt, and sea salt. Marinades. Regular salad dressings. Relishes, pickles, and olives. Meat flavorings and tenderizers, and bouillon cubes.  Horseradish, ketchup, and mustard. Worcestershire sauce. Teriyaki sauce, soy sauce (including reduced sodium). Hot sauce and Tabasco sauce. Steak sauce, fish sauce, oyster sauce, and cocktail sauce. Taco seasonings. Barbecue sauce. Tartar sauce. The items listed above may not be a complete list of foods and beverages you should avoid. Contact a dietitian for more information. Summary A heart failure eating plan includes changes that limit your intake of sodium and unhealthy fat, and it may help you lose weight or maintain a healthy weight. Your health care provider may also recommend limiting how much fluid you drink. Most people with heart failure should eat no more than 2,300 mg of salt (sodium) a day. The amount of sodium that is recommended for you may be lower, depending on your condition. Contact your health care provider or dietitian before making any major changes to your diet. This information is not intended to replace advice given to you by your health care provider. Make sure you discuss any questions you have with your health care provider. Document Revised: 05/02/2021 Document Reviewed: 09/07/2019 Elsevier Patient Education  2024 [employer].

## 2022-12-12 NOTE — Progress Notes (Signed)
Cardiology Clinic Note   Date: 12/14/2022 ID: Casey Velez, DOB [birth year], MRN [id number]  Primary Cardiologist:  Lorine Bears, MD  Patient Profile    Casey Velez is a 57 y.o. male who presents to the clinic today for hospital follow up.     Past medical history significant for: PAF. Onset October 2024. TEE/DCCV 11/26/2022: No LA/LAA thrombus.  Unsuccessful after 3 shocks. DCCV 11/29/2022: Successful after 3 shocks. Chronic HFrEF. Echo 11/19/2022: EF 30 to 35%.  Global hypokinesis.  Mild LVH.  Diastolic function could not be evaluated.  Moderately reduced RV function.  Mild RVH.  Moderately elevated PA pressure.  Mild LAE.  Small pericardial effusion.  Mild MR.  Mild to moderate TR.  Mild dilatation of ascending aorta 42 mm.  Dilated IVC, RA pressure 15 mmHg. OSA. On CPAP. Hyperlipidemia. Lipid panel 11/19/2022: LDL 33, HDL 18, TG 25, total 56. T2DM. GERD.  In summary, patient presented to the ED on 11/19/2022 with complaints of exertional shortness of breath and productive cough x 1 month.  EKG revealed A-fib with RVR rates in the 150s.  He was started on IV diltiazem.  Chest x-ray showed no acute process.  Initial labs: Hemoglobin 14.9, potassium 4.0, sodium 140, calcium 8.8, creatinine 1.27, magnesium 1.9, BNP 334.5, TSH 2.290.  Troponin 8>> 9.  Echo showed moderate to severely decreased LV function (details above).  Diltiazem was discontinued and patient was started on metoprolol for rate control.  Plan for TEE/DCCV once volume optimized.  Rates continue to be labile on metoprolol.  Given digoxin IV x 2 prior to transition to oral digoxin.  Heart rates improved to 90-100s, higher with ambulation.  Unsuccessful DCCV on 11/26/2022.  It was felt obesity and OSA contributing to unsuccessful attempts.  Patient started on amiodarone infusion but subsequently discontinued secondary to concern for QT prolongation.  Rhythm strips were reviewed and QT was felt to be within normal limits  with variable QTc in setting of A-fib.  Amiodarone was resumed.  Successful DCCV on 11/29/2022.  Patient transition to p.o. amiodarone and discharged on 11/30/2022.     History of Present Illness    Casey Velez is followed by Dr. Kirke Corin for the above outlined history.  Discussed the use of AI scribe software for clinical note transcription with the patient, who gave verbal consent to proceed.  The patient presents for a follow-up after recent hospitalization. He is doing well since discharge. He denies shortness of breath, dyspnea on exertion, lower extremity edema, orthopnea or PND. No chest pain, pressure, or tightness. No palpitations. He has no cardiac awareness of arrythmia, he only felt short of breath.   The patient is a Curator. His days vary with some days being very sedentary and other days constantly on his feet. He does not weigh daily. He has lost weight on Ozempic and believes his base weight is around 285 lb. Home BP ranges from 110-120/75-80. Patient denies blood in stool or urine.         ROS: All other systems reviewed and are otherwise negative except as noted in History of Present Illness.  Studies Reviewed    EKG Interpretation Date/Time:  Friday December 14 2022 09:27:39 EST Ventricular Rate:  61 PR Interval:  202 QRS Duration:  108 QT Interval:  448 QTC Calculation: 450 R Axis:   -62  Text Interpretation: Normal sinus rhythm Left axis deviation T wave abnormality, consider anterolateral ischemia When compared with ECG of 29-Nov-2022 21:41, Sinus rhythm has  replaced Atrial fibrillation Vent. rate has decreased BY  31 BPM Inverted T waves have replaced nonspecific T wave abnormality in Anterolateral leads QT has lengthened Confirmed by Carlos Levering [phone]) on 12/14/2022 9:39:35 AM   Risk Assessment/Calculations     CHA2DS2-VASc Score = 2   This indicates a 2.2% annual risk of stroke. The patient's score is based upon: CHF History: 1 HTN History:  0 Diabetes History: 1 Stroke History: 0 Vascular Disease History: 0 Age Score: 0 Gender Score: 0             Physical Exam    VS:  BP 110/60 (BP Location: Left Arm, Patient Position: Sitting, Cuff Size: Large)   Pulse 61   Ht 5\' 6"  (1.676 m)   Wt 291 lb 8 oz (132.2 kg)   SpO2 99%   BMI 47.05 kg/m  , BMI Body mass index is 47.05 kg/m.  GEN: Well nourished, well developed, in no acute distress. Neck: No JVD or carotid bruits. Cardiac:  RRR. No murmurs. No rubs or gallops.   Respiratory:  Respirations regular and unlabored. Clear to auscultation without rales, wheezing or rhonchi. GI: Soft, nontender, nondistended. Extremities: Radials/DP/PT 2+ and equal bilaterally. No clubbing or cyanosis. No edema.  Skin: Warm and dry, no rash. Neuro: Strength intact.  Assessment & Plan      EKG changes EKG shows new anterolateral T-wave abnormalities. Patient denies chest pain, pressure or tightness. Given LV dysfunction, he will need R/LHC after he completes 4 weeks of uninterrupted anticoagulation.  -Will set up [facility] at close follow up.  -ED precautions provided.   PAF Onset October 2024.  S/p DCCV 11/29/2022.  Patient denies palpitations or shortness of breath since hospital discharge. EKG today shows sinus rhythm.  Denies spontaneous bleeding concerns. RRR on exam.  -Continue amiodarone, Toprol, Eliquis. -EP evaluation scheduled with Dr. Lalla Brothers on 01/09/2023.  Chronic HFrEF/?Tachy-mediated cardiomyopathy Echo October 2024 showed EF 30 to 35%, global hypokinesis, moderately reduced RV function, mild LVH/RVH, moderately elevated PA pressure.  Patient has not been weighing daily. He denies lower extremity edema or abdominal bloating.  Euvolemic and well compensated on exam. -Continue Toprol, Lasix, Jardiance. -Unable to initiate ACE/ARB/ARNI or MRA secondary to soft BP.  -Will schedule for [facility] after being on uninterrupted AC for 4 weeks.  -BMP today.        Disposition: BMP  today. Return on 11/22 to schedule R/LHC.          Signed, Etta Grandchild. Jakeria Caissie, DNP, NP-C

## 2022-12-14 ENCOUNTER — Ambulatory Visit (INDEPENDENT_AMBULATORY_CARE_PROVIDER_SITE_OTHER): Payer: 59 | Admitting: Nurse Practitioner

## 2022-12-14 ENCOUNTER — Ambulatory Visit: Payer: 59 | Attending: Student | Admitting: Student

## 2022-12-14 ENCOUNTER — Encounter: Payer: Self-pay | Admitting: Nurse Practitioner

## 2022-12-14 ENCOUNTER — Encounter: Payer: Self-pay | Admitting: Student

## 2022-12-14 VITALS — BP 110/60 | HR 61 | Ht 66.0 in | Wt 291.5 lb

## 2022-12-14 VITALS — BP 104/64 | HR 62 | Temp 98.1°F | Wt 293.0 lb

## 2022-12-14 DIAGNOSIS — I480 Paroxysmal atrial fibrillation: Secondary | ICD-10-CM | POA: Diagnosis not present

## 2022-12-14 DIAGNOSIS — G4733 Obstructive sleep apnea (adult) (pediatric): Secondary | ICD-10-CM

## 2022-12-14 DIAGNOSIS — I4891 Unspecified atrial fibrillation: Secondary | ICD-10-CM

## 2022-12-14 DIAGNOSIS — Z6841 Body Mass Index (BMI) 40.0 and over, adult: Secondary | ICD-10-CM

## 2022-12-14 DIAGNOSIS — E1169 Type 2 diabetes mellitus with other specified complication: Secondary | ICD-10-CM | POA: Diagnosis not present

## 2022-12-14 DIAGNOSIS — I5022 Chronic systolic (congestive) heart failure: Secondary | ICD-10-CM | POA: Diagnosis not present

## 2022-12-14 DIAGNOSIS — I420 Dilated cardiomyopathy: Secondary | ICD-10-CM

## 2022-12-14 DIAGNOSIS — I5021 Acute systolic (congestive) heart failure: Secondary | ICD-10-CM

## 2022-12-14 DIAGNOSIS — Z7984 Long term (current) use of oral hypoglycemic drugs: Secondary | ICD-10-CM

## 2022-12-14 DIAGNOSIS — I429 Cardiomyopathy, unspecified: Secondary | ICD-10-CM | POA: Diagnosis not present

## 2022-12-14 DIAGNOSIS — E785 Hyperlipidemia, unspecified: Secondary | ICD-10-CM

## 2022-12-14 NOTE — Assessment & Plan Note (Signed)
Chronic, stable.  Uses CPAP nightly.  Praised for this.

## 2022-12-14 NOTE — Progress Notes (Signed)
BP 104/64   Pulse 62   Temp 98.1 F (36.7 C) (Oral)   Wt 293 lb (132.9 kg)   SpO2 98%   BMI 47.29 kg/m    Subjective:    Patient ID: [name], male    DOB: Dec 06, 1965, 57 y.o.   MRN: [id number]  HPI: AVISHAI KAPLER is a 57 y.o. male  Chief Complaint  Patient presents with   Hospitalization Follow-up   Transition of Care Hospital Follow up.  Admitted to hospital on 11/19/22 due to increased SOB and productive cough. Testing was done and noted to have A-fib RVR and CHF.  Has never seen cardiology, until now.  He saw cardiology this morning when saw NP [place], is scheduled for heart cath on 22nd.  Echo in ER noted LVEF 30-35% and mild LVH and moderate pulmonary hypertension.  Was started on Amiodarone, Lasix, and Eliquis.  Is tolerating both medications well.  Since left hospital has had no SOB, CP, or swelling in legs.  A1c while in hospital was 6.2%, taking Jardiance which he was on prior to hospitalization and well controlled diabetes.  Prior to hospitalization had not been on BP meds due to stable levels.  Was on Atorvastatin.  "Hospital Course LAJUAN PASCHEN is a 57 y.o. male with medical history significant of morbid obesity, type 2 diabetes, hyperlipidemia presenting with SOB x1 month with exertion and productive cough - states nothing got worse for him to come in to ED but wants to get checked out   10/14: to ED, admitted to hospitalist service for Afib RVR, suspected CHF, started on diltiazem and heparin, cardiology consulted 10/15: Echocardiogram yesterday evening (+)LVEF 30-35% with global hypokinesis, mild LVH. RV mildly dilated with moderately reduced contraction and mod Pulm HTN (RVSP 50 mmHg). There is mild to moderate tricuspid and mild mitral regurgitation. CVP severely elevated. Planning ischemia eval outpatient. Plan DCCV when diuresed  10/16 -10/19: continue diuresis. Plan DCCV Monday 10/21. Net IO Since Admission: -14,865.77 mL [11/24/22 0907]. 10/19 UOP  slowing somewhat  10/19: tachycardic but no symptoms  10/20: HR improved, UOP has leveled off, await DCCV tomorrow  10/21: TEE/DCCV today, briefly converted but then back to afib, continue on IV amiodarone and consider repeat cardioversion  10/22: planning repeat cardioversion Thursday 10/24 Patient underwent successful repeat cardioversion. IV amiodarone has been switched to oral by cardiologist and cleared for discharge today to follow-up as an outpatient"  Hospital/Facility: [facility] D/C Physician: Dr. Meriam Sprague D/C Date: 11/30/22  Records Requested: 12/14/22 Records Received: 12/14/22 Records Reviewed: 12/14/22  Diagnoses on Discharge:  Atrial fibrillation with rapid ventricular response, new diagnosis, POA CHA2DS2-VASc of 2 (diabetes and CHF) Acute systolic congestive heart failure, new diagnosis, POA  Date of interactive Contact within 48 hours of discharge:  Contact was through:  contact on 28th -- 3 days  Date of 7 day or 14 day face-to-face visit:    within 14 days  Outpatient Encounter Medications as of 12/14/2022  Medication Sig   albuterol (VENTOLIN HFA) 108 (90 Base) MCG/ACT inhaler Inhale 2 puffs into the lungs every 6 (six) hours as needed.   amiodarone (PACERONE) 200 MG tablet Take 2 tablets (400 mg total) by mouth 2 (two) times daily for 5 days, THEN 1 tablet (200 mg total) daily. (Patient taking differently: Take 1 tablet (200 mg total) daily.)   apixaban (ELIQUIS) 5 MG TABS tablet Take 1 tablet (5 mg total) by mouth 2 (two) times daily.   aspirin EC 81 MG tablet  Take 81 mg by mouth daily. Swallow whole.   atorvastatin (LIPITOR) 80 MG tablet Take 1 tablet (80 mg total) by mouth daily.   blood glucose meter kit and supplies Dispense based on patient and insurance preference. Check glucose in the morning fasting and before meals. Use up to four times daily as directed. (FOR ICD-10 E10.9, E11.9).   empagliflozin (JARDIANCE) 10 MG TABS tablet TAKE 1 TABLET(10 MG) BY MOUTH DAILY    famotidine (PEPCID) 20 MG tablet Take 20 mg by mouth daily.   furosemide (LASIX) 40 MG tablet Take 1 tablet (40 mg total) by mouth daily.   metoprolol succinate (TOPROL-XL) 25 MG 24 hr tablet Take 1 tablet (25 mg total) by mouth 2 (two) times daily.   Multiple Vitamin (MULTIVITAMIN WITH MINERALS) TABS tablet Take 1 tablet by mouth daily.   No facility-administered encounter medications on file as of 12/14/2022.    Diagnostic Tests Reviewed/Disposition:     Latest Ref Rng & Units 11/30/2022    4:50 AM 11/29/2022    3:45 AM 11/22/2022    3:35 AM  CBC  WBC 4.0 - 10.5 K/uL 10.3  10.6  10.6   Hemoglobin 13.0 - 17.0 g/dL 16.1  09.6  04.5   Hematocrit 39.0 - 52.0 % 48.1  49.8  44.6   Platelets 150 - 400 K/uL 253  294  245     CMP     Component Value Date/Time   NA 135 11/30/2022 0450   NA 141 09/19/2022 0823   K 3.5 11/30/2022 0450   CL 99 11/30/2022 0450   CO2 25 11/30/2022 0450   GLUCOSE 103 (H) 11/30/2022 0450   BUN 25 (H) 11/30/2022 0450   BUN 12 09/19/2022 0823   CREATININE 1.07 11/30/2022 0450   CALCIUM 8.8 (L) 11/30/2022 0450   PROT 7.1 11/20/2022 0527   PROT 6.7 09/19/2022 0823   ALBUMIN 3.8 11/20/2022 0527   ALBUMIN 4.1 09/19/2022 0823   AST 21 11/20/2022 0527   ALT 19 11/20/2022 0527   ALKPHOS 75 11/20/2022 0527   BILITOT 1.8 (H) 11/20/2022 0527   BILITOT 0.7 09/19/2022 0823   EGFR 83 09/19/2022 0823   GFRNONAA >60 11/30/2022 0450    Consults: Cardiology  Discharge Instructions: Follow-up with cardiology  Disease/illness Education: Reviewed with patient and wrote down HF monitoring guidelines  Home Health/Community Services Discussions/Referrals: none  Establishment or re-establishment of referral orders for community resources: none  Discussion with other health care providers: Reviewed all recent notes in chart  Assessment and Support of treatment regimen adherence: Reviewed with patient  Appointments Coordinated with: Reviewed with patient  Education  for self-management, independent living, and ADLs:  Reviewed with patient  Relevant past medical, surgical, family and social history reviewed and updated as indicated. Interim medical history since our last visit reviewed. Allergies and medications reviewed and updated.  Review of Systems  Constitutional:  Negative for activity change, appetite change, fatigue and fever.  Respiratory:  Negative for cough, chest tightness, shortness of breath and wheezing.   Cardiovascular:  Negative for chest pain, palpitations and leg swelling.  Gastrointestinal: Negative.   Neurological: Negative.   Psychiatric/Behavioral: Negative.     Per HPI unless specifically indicated above     Objective:    BP 104/64   Pulse 62   Temp 98.1 F (36.7 C) (Oral)   Wt 293 lb (132.9 kg)   SpO2 98%   BMI 47.29 kg/m   Wt Readings from Last 3 Encounters:  12/14/22 293  lb (132.9 kg)  12/14/22 291 lb 8 oz (132.2 kg)  11/29/22 285 lb 7.9 oz (129.5 kg)    Physical Exam Vitals and nursing note reviewed.  Constitutional:      General: He is awake. He is not in acute distress.    Appearance: He is well-developed and well-groomed. He is obese. He is not ill-appearing or toxic-appearing.  HENT:     Head: Normocephalic.     Right Ear: Hearing and external ear normal.     Left Ear: Hearing and external ear normal.  Eyes:     General: Lids are normal.     Extraocular Movements: Extraocular movements intact.     Conjunctiva/sclera: Conjunctivae normal.  Neck:     Thyroid: No thyromegaly.     Vascular: No carotid bruit.  Cardiovascular:     Rate and Rhythm: Normal rate and regular rhythm.     Heart sounds: Normal heart sounds. No murmur heard.    No gallop.     Comments: Regular rhythm noted today. Pulmonary:     Effort: Pulmonary effort is normal. No accessory muscle usage or respiratory distress.     Breath sounds: Normal breath sounds. No decreased breath sounds, wheezing or rhonchi.  Abdominal:      General: Bowel sounds are normal. There is no distension.     Palpations: Abdomen is soft.     Tenderness: There is no abdominal tenderness.  Musculoskeletal:     Cervical back: Full passive range of motion without pain.     Right lower leg: No edema.     Left lower leg: No edema.  Lymphadenopathy:     Cervical: No cervical adenopathy.  Skin:    General: Skin is warm.     Capillary Refill: Capillary refill takes less than 2 seconds.  Neurological:     Mental Status: He is alert and oriented to person, place, and time.     Deep Tendon Reflexes: Reflexes are normal and symmetric.     Reflex Scores:      Brachioradialis reflexes are 2+ on the right side and 2+ on the left side.      Patellar reflexes are 2+ on the right side and 2+ on the left side. Psychiatric:        Attention and Perception: Attention normal.        Mood and Affect: Mood normal.        Speech: Speech normal.        Behavior: Behavior normal. Behavior is cooperative.        Thought Content: Thought content normal.    Results for orders placed or performed during the hospital encounter of 11/19/22  CBC with Differential  Result Value Ref Range   WBC 11.2 (H) 4.0 - 10.5 K/uL   RBC 5.15 4.22 - 5.81 MIL/uL   Hemoglobin 14.9 13.0 - 17.0 g/dL   HCT 41.3 24.4 - 01.0 %   MCV 92.6 80.0 - 100.0 fL   MCH 28.9 26.0 - 34.0 pg   MCHC 31.2 30.0 - 36.0 g/dL   RDW 27.2 53.6 - 64.4 %   Platelets 270 150 - 400 K/uL   nRBC 0.0 0.0 - 0.2 %   Neutrophils Relative % 72 %   Neutro Abs 8.0 (H) 1.7 - 7.7 K/uL   Lymphocytes Relative 18 %   Lymphs Abs 2.0 0.7 - 4.0 K/uL   Monocytes Relative 8 %   Monocytes Absolute 0.9 0.1 - 1.0 K/uL   Eosinophils Relative 1 %  Eosinophils Absolute 0.2 0.0 - 0.5 K/uL   Basophils Relative 1 %   Basophils Absolute 0.1 0.0 - 0.1 K/uL   Immature Granulocytes 0 %   Abs Immature Granulocytes 0.04 0.00 - 0.07 K/uL  Comprehensive metabolic panel  Result Value Ref Range   Sodium 140 135 - 145 mmol/L    Potassium 4.0 3.5 - 5.1 mmol/L   Chloride 106 98 - 111 mmol/L   CO2 22 22 - 32 mmol/L   Glucose, Bld 105 (H) 70 - 99 mg/dL   BUN 23 (H) 6 - 20 mg/dL   Creatinine, Ser 8.41 (H) 0.61 - 1.24 mg/dL   Calcium 8.8 (L) 8.9 - 10.3 mg/dL   Total Protein 7.2 6.5 - 8.1 g/dL   Albumin 3.8 3.5 - 5.0 g/dL   AST 26 15 - 41 U/L   ALT 20 0 - 44 U/L   Alkaline Phosphatase 83 38 - 126 U/L   Total Bilirubin 0.7 0.3 - 1.2 mg/dL   GFR, Estimated >66 >06 mL/min   Anion gap 12 5 - 15  Magnesium  Result Value Ref Range   Magnesium 1.9 1.7 - 2.4 mg/dL  TSH  Result Value Ref Range   TSH 2.290 0.350 - 4.500 uIU/mL  Urine Drug Screen, Qualitative ([facility] only)  Result Value Ref Range   Tricyclic, Ur Screen NONE DETECTED NONE DETECTED   Amphetamines, Ur Screen NONE DETECTED NONE DETECTED   MDMA (Ecstasy)Ur Screen NONE DETECTED NONE DETECTED   Cocaine Metabolite,Ur [place] NONE DETECTED NONE DETECTED   Opiate, Ur Screen NONE DETECTED NONE DETECTED   Phencyclidine (PCP) Ur S NONE DETECTED NONE DETECTED   Cannabinoid 50 Ng, Ur [place] NONE DETECTED NONE DETECTED   Barbiturates, Ur Screen NONE DETECTED NONE DETECTED   Benzodiazepine, Ur Scrn NONE DETECTED NONE DETECTED   Methadone Scn, Ur NONE DETECTED NONE DETECTED  HIV Antibody (routine testing w rflx)  Result Value Ref Range   HIV Screen 4th Generation wRfx Non Reactive Non Reactive  Lipid panel  Result Value Ref Range   Cholesterol 56 0 - 200 mg/dL   Triglycerides 25 <301 mg/dL   HDL 18 (L) >60 mg/dL   Total CHOL/HDL Ratio 3.1 RATIO   VLDL 5 0 - 40 mg/dL   LDL Cholesterol 33 0 - 99 mg/dL  Brain natriuretic peptide  Result Value Ref Range   B Natriuretic Peptide 334.5 (H) 0.0 - 100.0 pg/mL  Heparin level (unfractionated)  Result Value Ref Range   Heparin Unfractionated 0.56 0.30 - 0.70 IU/mL  APTT  Result Value Ref Range   aPTT 32 24 - 36 seconds  Protime-INR  Result Value Ref Range   Prothrombin Time 16.0 (H) 11.4 - 15.2 seconds   INR 1.3 (H) 0.8 -  1.2  CBC  Result Value Ref Range   WBC 11.7 (H) 4.0 - 10.5 K/uL   RBC 4.69 4.22 - 5.81 MIL/uL   Hemoglobin 13.7 13.0 - 17.0 g/dL   HCT 10.9 32.3 - 55.7 %   MCV 89.6 80.0 - 100.0 fL   MCH 29.2 26.0 - 34.0 pg   MCHC 32.6 30.0 - 36.0 g/dL   RDW 32.2 02.5 - 42.7 %   Platelets 237 150 - 400 K/uL   nRBC 0.0 0.0 - 0.2 %  Comprehensive metabolic panel  Result Value Ref Range   Sodium 136 135 - 145 mmol/L   Potassium 3.7 3.5 - 5.1 mmol/L   Chloride 103 98 - 111 mmol/L   CO2 22 22 -  32 mmol/L   Glucose, Bld 106 (H) 70 - 99 mg/dL   BUN 18 6 - 20 mg/dL   Creatinine, Ser 1.61 0.61 - 1.24 mg/dL   Calcium 8.8 (L) 8.9 - 10.3 mg/dL   Total Protein 7.1 6.5 - 8.1 g/dL   Albumin 3.8 3.5 - 5.0 g/dL   AST 21 15 - 41 U/L   ALT 19 0 - 44 U/L   Alkaline Phosphatase 75 38 - 126 U/L   Total Bilirubin 1.8 (H) 0.3 - 1.2 mg/dL   GFR, Estimated >09 >60 mL/min   Anion gap 11 5 - 15  Heparin level (unfractionated)  Result Value Ref Range   Heparin Unfractionated 0.42 0.30 - 0.70 IU/mL  Glucose, capillary  Result Value Ref Range   Glucose-Capillary 102 (H) 70 - 99 mg/dL  Hemoglobin A5W  Result Value Ref Range   Hgb A1c MFr Bld 6.2 (H) 4.8 - 5.6 %   Mean Plasma Glucose 131.24 mg/dL  Heparin level (unfractionated)  Result Value Ref Range   Heparin Unfractionated 0.35 0.30 - 0.70 IU/mL  Glucose, capillary  Result Value Ref Range   Glucose-Capillary 92 70 - 99 mg/dL  Glucose, capillary  Result Value Ref Range   Glucose-Capillary 108 (H) 70 - 99 mg/dL  Glucose, capillary  Result Value Ref Range   Glucose-Capillary 90 70 - 99 mg/dL  Heparin level (unfractionated)  Result Value Ref Range   Heparin Unfractionated 0.11 (L) 0.30 - 0.70 IU/mL  CBC  Result Value Ref Range   WBC 10.0 4.0 - 10.5 K/uL   RBC 4.80 4.22 - 5.81 MIL/uL   Hemoglobin 13.8 13.0 - 17.0 g/dL   HCT 09.8 11.9 - 14.7 %   MCV 88.5 80.0 - 100.0 fL   MCH 28.8 26.0 - 34.0 pg   MCHC 32.5 30.0 - 36.0 g/dL   RDW 82.9 56.2 - 13.0 %    Platelets 234 150 - 400 K/uL   nRBC 0.0 0.0 - 0.2 %  Basic metabolic panel  Result Value Ref Range   Sodium 134 (L) 135 - 145 mmol/L   Potassium 3.9 3.5 - 5.1 mmol/L   Chloride 102 98 - 111 mmol/L   CO2 23 22 - 32 mmol/L   Glucose, Bld 94 70 - 99 mg/dL   BUN 22 (H) 6 - 20 mg/dL   Creatinine, Ser 8.65 0.61 - 1.24 mg/dL   Calcium 8.7 (L) 8.9 - 10.3 mg/dL   GFR, Estimated >78 >46 mL/min   Anion gap 9 5 - 15  Glucose, capillary  Result Value Ref Range   Glucose-Capillary 109 (H) 70 - 99 mg/dL  Heparin level (unfractionated)  Result Value Ref Range   Heparin Unfractionated 0.34 0.30 - 0.70 IU/mL  Glucose, capillary  Result Value Ref Range   Glucose-Capillary 87 70 - 99 mg/dL  Glucose, capillary  Result Value Ref Range   Glucose-Capillary 110 (H) 70 - 99 mg/dL  Heparin level (unfractionated)  Result Value Ref Range   Heparin Unfractionated 0.34 0.30 - 0.70 IU/mL  Glucose, capillary  Result Value Ref Range   Glucose-Capillary 72 70 - 99 mg/dL  CBC  Result Value Ref Range   WBC 10.6 (H) 4.0 - 10.5 K/uL   RBC 5.08 4.22 - 5.81 MIL/uL   Hemoglobin 14.6 13.0 - 17.0 g/dL   HCT 96.2 95.2 - 84.1 %   MCV 87.8 80.0 - 100.0 fL   MCH 28.7 26.0 - 34.0 pg   MCHC 32.7 30.0 - 36.0 g/dL  RDW 13.6 11.5 - 15.5 %   Platelets 245 150 - 400 K/uL   nRBC 0.0 0.0 - 0.2 %  Heparin level (unfractionated)  Result Value Ref Range   Heparin Unfractionated 0.18 (L) 0.30 - 0.70 IU/mL  Glucose, capillary  Result Value Ref Range   Glucose-Capillary 83 70 - 99 mg/dL  Glucose, capillary  Result Value Ref Range   Glucose-Capillary 91 70 - 99 mg/dL  Basic metabolic panel  Result Value Ref Range   Sodium 136 135 - 145 mmol/L   Potassium 3.5 3.5 - 5.1 mmol/L   Chloride 98 98 - 111 mmol/L   CO2 27 22 - 32 mmol/L   Glucose, Bld 99 70 - 99 mg/dL   BUN 17 6 - 20 mg/dL   Creatinine, Ser 1.61 0.61 - 1.24 mg/dL   Calcium 8.9 8.9 - 09.6 mg/dL   GFR, Estimated >04 >54 mL/min   Anion gap 11 5 - 15   Magnesium  Result Value Ref Range   Magnesium 2.0 1.7 - 2.4 mg/dL  Glucose, capillary  Result Value Ref Range   Glucose-Capillary 106 (H) 70 - 99 mg/dL  Glucose, capillary  Result Value Ref Range   Glucose-Capillary 103 (H) 70 - 99 mg/dL  Basic metabolic panel  Result Value Ref Range   Sodium 137 135 - 145 mmol/L   Potassium 4.0 3.5 - 5.1 mmol/L   Chloride 100 98 - 111 mmol/L   CO2 26 22 - 32 mmol/L   Glucose, Bld 99 70 - 99 mg/dL   BUN 20 6 - 20 mg/dL   Creatinine, Ser 0.98 0.61 - 1.24 mg/dL   Calcium 9.2 8.9 - 11.9 mg/dL   GFR, Estimated >14 >78 mL/min   Anion gap 11 5 - 15  Magnesium  Result Value Ref Range   Magnesium 2.2 1.7 - 2.4 mg/dL  Glucose, capillary  Result Value Ref Range   Glucose-Capillary 114 (H) 70 - 99 mg/dL  Glucose, capillary  Result Value Ref Range   Glucose-Capillary 101 (H) 70 - 99 mg/dL  Glucose, capillary  Result Value Ref Range   Glucose-Capillary 119 (H) 70 - 99 mg/dL  Glucose, capillary  Result Value Ref Range   Glucose-Capillary 88 70 - 99 mg/dL  Digoxin level  Result Value Ref Range   Digoxin Level 0.2 (L) 0.8 - 2.0 ng/mL  Basic metabolic panel  Result Value Ref Range   Sodium 136 135 - 145 mmol/L   Potassium 4.1 3.5 - 5.1 mmol/L   Chloride 100 98 - 111 mmol/L   CO2 25 22 - 32 mmol/L   Glucose, Bld 105 (H) 70 - 99 mg/dL   BUN 22 (H) 6 - 20 mg/dL   Creatinine, Ser 2.95 0.61 - 1.24 mg/dL   Calcium 9.4 8.9 - 62.1 mg/dL   GFR, Estimated >30 >86 mL/min   Anion gap 11 5 - 15  Magnesium  Result Value Ref Range   Magnesium 2.4 1.7 - 2.4 mg/dL  Basic metabolic panel  Result Value Ref Range   Sodium 135 135 - 145 mmol/L   Potassium 4.1 3.5 - 5.1 mmol/L   Chloride 99 98 - 111 mmol/L   CO2 25 22 - 32 mmol/L   Glucose, Bld 102 (H) 70 - 99 mg/dL   BUN 26 (H) 6 - 20 mg/dL   Creatinine, Ser 5.78 0.61 - 1.24 mg/dL   Calcium 9.4 8.9 - 46.9 mg/dL   GFR, Estimated >62 >95 mL/min   Anion gap 11 5 -  15  Basic metabolic panel  Result Value  Ref Range   Sodium 136 135 - 145 mmol/L   Potassium 4.0 3.5 - 5.1 mmol/L   Chloride 99 98 - 111 mmol/L   CO2 24 22 - 32 mmol/L   Glucose, Bld 99 70 - 99 mg/dL   BUN 33 (H) 6 - 20 mg/dL   Creatinine, Ser 4.09 0.61 - 1.24 mg/dL   Calcium 9.6 8.9 - 81.1 mg/dL   GFR, Estimated >91 >47 mL/min   Anion gap 13 5 - 15  Glucose, capillary  Result Value Ref Range   Glucose-Capillary 98 70 - 99 mg/dL  Basic metabolic panel  Result Value Ref Range   Sodium 136 135 - 145 mmol/L   Potassium 3.8 3.5 - 5.1 mmol/L   Chloride 99 98 - 111 mmol/L   CO2 25 22 - 32 mmol/L   Glucose, Bld 129 (H) 70 - 99 mg/dL   BUN 35 (H) 6 - 20 mg/dL   Creatinine, Ser 8.29 0.61 - 1.24 mg/dL   Calcium 9.3 8.9 - 56.2 mg/dL   GFR, Estimated >13 >08 mL/min   Anion gap 12 5 - 15  Basic metabolic panel  Result Value Ref Range   Sodium 136 135 - 145 mmol/L   Potassium 3.6 3.5 - 5.1 mmol/L   Chloride 98 98 - 111 mmol/L   CO2 25 22 - 32 mmol/L   Glucose, Bld 98 70 - 99 mg/dL   BUN 32 (H) 6 - 20 mg/dL   Creatinine, Ser 6.57 0.61 - 1.24 mg/dL   Calcium 9.6 8.9 - 84.6 mg/dL   GFR, Estimated >96 >29 mL/min   Anion gap 13 5 - 15  Magnesium  Result Value Ref Range   Magnesium 2.5 (H) 1.7 - 2.4 mg/dL  Digoxin level  Result Value Ref Range   Digoxin Level <0.2 (L) 0.8 - 2.0 ng/mL  CBC with Differential/Platelet  Result Value Ref Range   WBC 10.6 (H) 4.0 - 10.5 K/uL   RBC 5.77 4.22 - 5.81 MIL/uL   Hemoglobin 16.4 13.0 - 17.0 g/dL   HCT 52.8 41.3 - 24.4 %   MCV 86.3 80.0 - 100.0 fL   MCH 28.4 26.0 - 34.0 pg   MCHC 32.9 30.0 - 36.0 g/dL   RDW 01.0 27.2 - 53.6 %   Platelets 294 150 - 400 K/uL   nRBC 0.0 0.0 - 0.2 %   Neutrophils Relative % 59 %   Neutro Abs 6.3 1.7 - 7.7 K/uL   Lymphocytes Relative 28 %   Lymphs Abs 2.9 0.7 - 4.0 K/uL   Monocytes Relative 10 %   Monocytes Absolute 1.0 0.1 - 1.0 K/uL   Eosinophils Relative 2 %   Eosinophils Absolute 0.2 0.0 - 0.5 K/uL   Basophils Relative 1 %   Basophils Absolute  0.1 0.0 - 0.1 K/uL   Immature Granulocytes 0 %   Abs Immature Granulocytes 0.04 0.00 - 0.07 K/uL  Basic metabolic panel  Result Value Ref Range   Sodium 135 135 - 145 mmol/L   Potassium 3.7 3.5 - 5.1 mmol/L   Chloride 98 98 - 111 mmol/L   CO2 27 22 - 32 mmol/L   Glucose, Bld 105 (H) 70 - 99 mg/dL   BUN 26 (H) 6 - 20 mg/dL   Creatinine, Ser 6.44 0.61 - 1.24 mg/dL   Calcium 8.9 8.9 - 03.4 mg/dL   GFR, Estimated >74 >25 mL/min   Anion gap 10 5 -  15  Glucose, capillary  Result Value Ref Range   Glucose-Capillary 346 (H) 70 - 99 mg/dL  Glucose, capillary  Result Value Ref Range   Glucose-Capillary 104 (H) 70 - 99 mg/dL  CBC with Differential/Platelet  Result Value Ref Range   WBC 10.3 4.0 - 10.5 K/uL   RBC 5.56 4.22 - 5.81 MIL/uL   Hemoglobin 15.8 13.0 - 17.0 g/dL   HCT 87.5 64.3 - 32.9 %   MCV 86.5 80.0 - 100.0 fL   MCH 28.4 26.0 - 34.0 pg   MCHC 32.8 30.0 - 36.0 g/dL   RDW 51.8 84.1 - 66.0 %   Platelets 253 150 - 400 K/uL   nRBC 0.0 0.0 - 0.2 %   Neutrophils Relative % 64 %   Neutro Abs 6.6 1.7 - 7.7 K/uL   Lymphocytes Relative 25 %   Lymphs Abs 2.5 0.7 - 4.0 K/uL   Monocytes Relative 8 %   Monocytes Absolute 0.9 0.1 - 1.0 K/uL   Eosinophils Relative 2 %   Eosinophils Absolute 0.2 0.0 - 0.5 K/uL   Basophils Relative 1 %   Basophils Absolute 0.1 0.0 - 0.1 K/uL   Immature Granulocytes 0 %   Abs Immature Granulocytes 0.04 0.00 - 0.07 K/uL  Basic metabolic panel  Result Value Ref Range   Sodium 135 135 - 145 mmol/L   Potassium 3.5 3.5 - 5.1 mmol/L   Chloride 99 98 - 111 mmol/L   CO2 25 22 - 32 mmol/L   Glucose, Bld 103 (H) 70 - 99 mg/dL   BUN 25 (H) 6 - 20 mg/dL   Creatinine, Ser 6.30 0.61 - 1.24 mg/dL   Calcium 8.8 (L) 8.9 - 10.3 mg/dL   GFR, Estimated >16 >01 mL/min   Anion gap 11 5 - 15  Glucose, capillary  Result Value Ref Range   Glucose-Capillary 112 (H) 70 - 99 mg/dL  Glucose, capillary  Result Value Ref Range   Glucose-Capillary 128 (H) 70 - 99 mg/dL   Glucose, capillary  Result Value Ref Range   Glucose-Capillary 105 (H) 70 - 99 mg/dL  ECHOCARDIOGRAM COMPLETE  Result Value Ref Range   Weight 5,280 oz   Height 66 in   BP 126/83 mmHg   S' Lateral 4.70 cm   AV Area VTI 2.07 cm2   AR max vel 2.11 cm2   AV Area mean vel 2.05 cm2   Est EF 30 - 35%    AV Peak grad 7.3 mmHg   Ao pk vel 1.35 m/s   AV Mean grad 4.2 mmHg  ECHO TEE  Result Value Ref Range   Est EF 30   Troponin I (High Sensitivity)  Result Value Ref Range   Troponin I (High Sensitivity) 8 <18 ng/L  Troponin I (High Sensitivity)  Result Value Ref Range   Troponin I (High Sensitivity) 9 <18 ng/L      Assessment & Plan:   Problem List Items Addressed This Visit       Cardiovascular and Mediastinum   Acute HFrEF (heart failure with reduced ejection fraction) (HCC)    Acute with new diagnosis A-Fib.  EF was 30-35%.  Continue collaboration with cardiology and current medication regimen as ordered by them. Euvolemic.  Educated him on HF patient monitoring.  He will start weighing self daily.  Recommend: - Reminded to call for an overnight weight gain of >2 pounds or a weekly weight gain of >5 pounds - not adding salt to food and read food  labels. Reviewed the importance of keeping daily sodium intake to 2000mg  daily.  - Avoid Ibuprofen products      Relevant Orders   TSH   T4, free   Atrial fibrillation with RVR (HCC) - Primary    New diagnosis on 11/19/22 after experiencing SOB for a few weeks.  Regular rate today.  At this time symptoms much improved.  Continue current medication regimen and collaboration with cardiology.  Check TSH and Free T4 today, is on Amiodarone now.  Educated him on this.      Cardiomyopathy [facility])    Noted in hospital.  Continue to collaborate with cardiology and continue current medication regimen as ordered by them.        Respiratory   OSA on CPAP    Chronic, stable.  Uses CPAP nightly.  Praised for this.        Endocrine    Hyperlipidemia associated with type 2 diabetes mellitus (HCC)    Chronic, ongoing.  Continue current medication regimen and adjust as needed.  Lipid panel today.      Relevant Orders   Lipid Panel w/o Chol/HDL Ratio   Type 2 diabetes mellitus with morbid obesity (HCC)    Chronic, stable with 6.2% in hospital.  Urine ALB <10 March 2022.  Continue Jardiance only at this time, which is cost effective for him.  He agrees with this plan.  Continue to check BS every other day and document for visits.  Focus on diabetic diet.   - Eye exam up to date - Foot exam up to date - PCV20 needed, discussed with patient - No ACE or ARB -- will consider in future, but currently BP on lower side at baseline.  Educated him on this. Jardiance does offer kidney protection. - Statin on board.        Other   Body mass index (BMI) of 50-59.9 in adult [facility])    Refer to morbid obesity plan of care.      Morbid obesity (HCC)    BMI 47.29 with T2DM, OSA, A-FIB.  Recommended eating smaller high protein, low fat meals more frequently and exercising 30 mins a day 5 times a week with a goal of 10-15lb weight loss in the next 3 months. Patient voiced their understanding and motivation to adhere to these recommendations.         Follow up plan: Return for as scheduled on February 14th.

## 2022-12-14 NOTE — Assessment & Plan Note (Signed)
Refer to morbid obesity plan of care. 

## 2022-12-14 NOTE — Assessment & Plan Note (Signed)
BMI 47.29 with T2DM, OSA, A-FIB.  Recommended eating smaller high protein, low fat meals more frequently and exercising 30 mins a day 5 times a week with a goal of 10-15lb weight loss in the next 3 months. Patient voiced their understanding and motivation to adhere to these recommendations.

## 2022-12-14 NOTE — Patient Instructions (Signed)
Medication Instructions:  The current medical regimen is effective;  continue present plan and medications.  *If you need a refill on your cardiac medications before your next appointment, please call your pharmacy*   Lab Work: Your provider would like for you to have following labs drawn today BMET.   If you have labs (blood work) drawn today and your tests are completely normal, you will receive your results only by: MyChart Message (if you have MyChart) OR A paper copy in the mail If you have any lab test that is abnormal or we need to change your treatment, we will call you to review the results.   Follow-Up: At [facility], you and your health needs are our priority.  As part of our continuing mission to provide you with exceptional heart care, we have created designated Provider Care Teams.  These Care Teams include your primary Cardiologist (physician) and Advanced Practice Providers (APPs -  Physician Assistants and Nurse Practitioners) who all work together to provide you with the care you need, when you need it.  We recommend signing up for the patient portal called "MyChart".  Sign up information is provided on this After Visit Summary.  MyChart is used to connect with patients for Virtual Visits (Telemedicine).  Patients are able to view lab/test results, encounter notes, upcoming appointments, etc.  Non-urgent messages can be sent to your provider as well.   To learn more about what you can do with MyChart, go to [email or web address].    Your next appointment:   November 22nd   Provider:   Carlos Levering, NP    Other Instructions Weight daily (call us for weight gain of 3lbs in a day or 5lbs in a week)

## 2022-12-14 NOTE — Assessment & Plan Note (Signed)
Chronic, stable with 6.2% in hospital.  Urine ALB <10 March 2022.  Continue Jardiance only at this time, which is cost effective for him.  He agrees with this plan.  Continue to check BS every other day and document for visits.  Focus on diabetic diet.   - Eye exam up to date - Foot exam up to date - PCV20 needed, discussed with patient - No ACE or ARB -- will consider in future, but currently BP on lower side at baseline.  Educated him on this. Jardiance does offer kidney protection. - Statin on board.

## 2022-12-14 NOTE — Assessment & Plan Note (Signed)
New diagnosis on 11/19/22 after experiencing SOB for a few weeks.  Regular rate today.  At this time symptoms much improved.  Continue current medication regimen and collaboration with cardiology.  Check TSH and Free T4 today, is on Amiodarone now.  Educated him on this.

## 2022-12-14 NOTE — Assessment & Plan Note (Signed)
Chronic, ongoing.  Continue current medication regimen and adjust as needed. Lipid panel today. 

## 2022-12-14 NOTE — Assessment & Plan Note (Signed)
Acute with new diagnosis A-Fib.  EF was 30-35%.  Continue collaboration with cardiology and current medication regimen as ordered by them. Euvolemic.  Educated him on HF patient monitoring.  He will start weighing self daily.  Recommend: - Reminded to call for an overnight weight gain of >2 pounds or a weekly weight gain of >5 pounds - not adding salt to food and read food labels. Reviewed the importance of keeping daily sodium intake to 2000mg  daily.  - Avoid Ibuprofen products

## 2022-12-14 NOTE — Assessment & Plan Note (Signed)
Noted in hospital.  Continue to collaborate with cardiology and continue current medication regimen as ordered by them.

## 2022-12-15 LAB — LIPID PANEL W/O CHOL/HDL RATIO
Cholesterol, Total: 137 mg/dL (ref 100–199)
HDL: 37 mg/dL — ABNORMAL LOW (ref >39)
LDL Chol Calc (NIH): 72 mg/dL (ref 0–99)
Triglycerides: 165 mg/dL — ABNORMAL HIGH (ref 0–149)
VLDL Cholesterol Cal: 28 mg/dL (ref 5–40)

## 2022-12-15 LAB — T4, FREE: Free T4: 0.91 ng/dL (ref 0.82–1.77)

## 2022-12-15 LAB — TSH: TSH: 3.41 u[IU]/mL (ref 0.450–4.500)

## 2022-12-15 NOTE — Progress Notes (Signed)
Contacted via MyChart   Good evening Casey Velez, your labs have returned.  So far your lipid panel was a bit better with Rosuvastatin, but we will monitor with Atorvastatin and see what happens.  We may change back if needed.  Thyroid levels are normal.  Any questions? Keep being excellent!!  Thank you for allowing me to participate in your care.  I appreciate you. Kindest regards, Gerber Penza

## 2022-12-18 LAB — BASIC METABOLIC PANEL

## 2022-12-27 NOTE — Progress Notes (Signed)
Cardiology Clinic Note   Date: 12/28/2022 ID: Pelham, Saile [birth year], MRN [id number]  Primary Cardiologist:  Lorine Bears, MD  Patient Profile    Casey Velez is a 57 y.o. male who presents to the clinic today for follow up.     Past medical history significant for: PAF. Onset October 2024. TEE/DCCV 11/26/2022: No LA/LAA thrombus.  Unsuccessful after 3 shocks. DCCV 11/29/2022: Successful after 3 shocks. Chronic HFrEF. Echo 11/19/2022: EF 30 to 35%.  Global hypokinesis.  Mild LVH.  Diastolic function could not be evaluated.  Moderately reduced RV function.  Mild RVH.  Moderately elevated PA pressure.  Mild LAE.  Small pericardial effusion.  Mild MR.  Mild to moderate TR.  Mild dilatation of ascending aorta 42 mm.  Dilated IVC, RA pressure 15 mmHg. OSA. On CPAP. Hyperlipidemia. Lipid panel 11/19/2022: LDL 33, HDL 18, TG 25, total 56. T2DM. GERD.  In summary, patient presented to the ED on 11/19/2022 with complaints of exertional shortness of breath and productive cough x 1 month.  EKG revealed A-fib with RVR rates in the 150s.  He was started on IV diltiazem.  Chest x-ray showed no acute process.  Initial labs: Hemoglobin 14.9, potassium 4.0, sodium 140, calcium 8.8, creatinine 1.27, magnesium 1.9, BNP 334.5, TSH 2.290.  Troponin 8>> 9.  Echo showed moderate to severely decreased LV function (details above).  Diltiazem was discontinued and patient was started on metoprolol for rate control.  Plan for TEE/DCCV once volume optimized.  Rates continue to be labile on metoprolol.  Given digoxin IV x 2 prior to transition to oral digoxin.  Heart rates improved to 90-100s, higher with ambulation.  Unsuccessful DCCV on 11/26/2022.  It was felt obesity and OSA contributing to unsuccessful attempts.  Patient started on amiodarone infusion but subsequently discontinued secondary to concern for QT prolongation.  Rhythm strips were reviewed and QT was felt to be within normal limits with  variable QTc in setting of A-fib.  Amiodarone was resumed.  Successful DCCV on 11/29/2022.  Patient transition to p.o. amiodarone and discharged on 11/30/2022.      History of Present Illness    Casey Velez is followed by Dr. Kirke Corin for the above outlined history.  Patient was last seen in the office by me on 12/14/2022 for hospital follow-up.  He was doing well at that time and maintaining sinus rhythm.  EKG showed new anterolateral T wave abnormalities but he was symptom-free with no chest pain or shortness of breath.  He was euvolemic at the time of his visit.  Plan for [facility] after completing 4 weeks of AC post cardioversion.  Discussed the use of AI scribe software for clinical note transcription with the patient, who gave verbal consent to proceed.  The patient presents for follow-up. Patient denies shortness of breath, dyspnea on exertion, lower extremity edema, orthopnea or PND. No chest pain, pressure, or tightness. No palpitations.  He has no cardiac awareness of arrhythmia. When he was in afib he felt short of breath. He is now 4 weeks out from cardioversion. Discussed proceeding with heart catheterization. He is in agreement.         ROS: All other systems reviewed and are otherwise negative except as noted in History of Present Illness.  Studies Reviewed    EKG Interpretation Date/Time:  Friday December 28 2022 08:02:57 EST Ventricular Rate:  62 PR Interval:  188 QRS Duration:  114 QT Interval:  482 QTC Calculation: 489 R Axis:   -  58  Text Interpretation: Sinus rhythm with Premature atrial complexes Left axis deviation Right bundle branch block T wave abnormality, consider lateral ischemia When compared with ECG of 14-Dec-2022 09:27, Premature atrial complexes are now Present Right bundle branch block is now Present Confirmed by Carlos Levering [phone]) on 12/28/2022 8:14:12 AM    Risk Assessment/Calculations     CHA2DS2-VASc Score = 2   This indicates a 2.2%  annual risk of stroke. The patient's score is based upon: CHF History: 1 HTN History: 0 Diabetes History: 1 Stroke History: 0 Vascular Disease History: 0 Age Score: 0 Gender Score: 0             Physical Exam    VS:  BP 110/68 (BP Location: Left Arm, Patient Position: Sitting, Cuff Size: Normal)   Pulse 62   Ht 5\' 6"  (1.676 m)   Wt 299 lb 6.4 oz (135.8 kg)   SpO2 97%   BMI 48.32 kg/m  , BMI Body mass index is 48.32 kg/m.  GEN: Well nourished, well developed, in no acute distress. Neck: No JVD or carotid bruits. Cardiac:  RRR. No murmurs. No rubs or gallops.   Respiratory:  Respirations regular and unlabored. Clear to auscultation without rales, wheezing or rhonchi. GI: Soft, nontender, nondistended. Extremities: Radials/DP/PT 2+ and equal bilaterally. No clubbing or cyanosis. No edema/  Skin: Warm and dry, no rash. Neuro: Strength intact.  Assessment & Plan      PAF Onset October 2024.  S/p DCCV 11/29/2022.  Patient denies palpitations or shortness of breath. EKG today shows sinus rhythm with PACs..  Denies spontaneous bleeding concerns. RRR on exam.  -Continue amiodarone, Toprol, Eliquis. -EP evaluation scheduled with Dr. Lalla Brothers on 01/09/2023.   Chronic HFrEF/?Tachy-mediated cardiomyopathy Echo October 2024 showed EF 30 to 35%, global hypokinesis, moderately reduced RV function, mild LVH/RVH, moderately elevated PA pressure.  Patient denies lower extremity edema, shortness of breath, DOE, orthopnea or PND.  Euvolemic and well compensated on exam. -Continue Toprol, Lasix, Jardiance. -Unable to initiate ACE/ARB/ARNI or MRA secondary to soft BP. -Will schedule for [facility].         Disposition: R/LHC. CBC and BMP today. Keep previously scheduled visit with Dr. Lalla Brothers on 12/4. Return 2 weeks post cath or sooner as needed.       Informed Consent   Shared Decision Making/Informed Consent The risks [stroke (1 in 1000), death (1 in 1000), kidney failure [usually  temporary] (1 in 500), bleeding (1 in 200), allergic reaction [possibly serious] (1 in 200)], benefits (diagnostic support and management of coronary artery disease) and alternatives of a cardiac catheterization were discussed in detail with Mr. Schulenberg and he is willing to proceed.      Signed, Etta Grandchild. Mack Thurmon, DNP, NP-C

## 2022-12-27 NOTE — H&P (View-Only) (Signed)
 Cardiology Clinic Note   Date: 12/28/2022 ID: Pelham, Saile [birth year], MRN [id number]  Primary Cardiologist:  Lorine Bears, MD  Patient Profile    Casey Velez is a 57 y.o. male who presents to the clinic today for follow up.     Past medical history significant for: PAF. Onset October 2024. TEE/DCCV 11/26/2022: No LA/LAA thrombus.  Unsuccessful after 3 shocks. DCCV 11/29/2022: Successful after 3 shocks. Chronic HFrEF. Echo 11/19/2022: EF 30 to 35%.  Global hypokinesis.  Mild LVH.  Diastolic function could not be evaluated.  Moderately reduced RV function.  Mild RVH.  Moderately elevated PA pressure.  Mild LAE.  Small pericardial effusion.  Mild MR.  Mild to moderate TR.  Mild dilatation of ascending aorta 42 mm.  Dilated IVC, RA pressure 15 mmHg. OSA. On CPAP. Hyperlipidemia. Lipid panel 11/19/2022: LDL 33, HDL 18, TG 25, total 56. T2DM. GERD.  In summary, patient presented to the ED on 11/19/2022 with complaints of exertional shortness of breath and productive cough x 1 month.  EKG revealed A-fib with RVR rates in the 150s.  He was started on IV diltiazem.  Chest x-ray showed no acute process.  Initial labs: Hemoglobin 14.9, potassium 4.0, sodium 140, calcium 8.8, creatinine 1.27, magnesium 1.9, BNP 334.5, TSH 2.290.  Troponin 8>> 9.  Echo showed moderate to severely decreased LV function (details above).  Diltiazem was discontinued and patient was started on metoprolol for rate control.  Plan for TEE/DCCV once volume optimized.  Rates continue to be labile on metoprolol.  Given digoxin IV x 2 prior to transition to oral digoxin.  Heart rates improved to 90-100s, higher with ambulation.  Unsuccessful DCCV on 11/26/2022.  It was felt obesity and OSA contributing to unsuccessful attempts.  Patient started on amiodarone infusion but subsequently discontinued secondary to concern for QT prolongation.  Rhythm strips were reviewed and QT was felt to be within normal limits with  variable QTc in setting of A-fib.  Amiodarone was resumed.  Successful DCCV on 11/29/2022.  Patient transition to p.o. amiodarone and discharged on 11/30/2022.      History of Present Illness    Casey Velez is followed by Dr. Kirke Corin for the above outlined history.  Patient was last seen in the office by me on 12/14/2022 for hospital follow-up.  He was doing well at that time and maintaining sinus rhythm.  EKG showed new anterolateral T wave abnormalities but he was symptom-free with no chest pain or shortness of breath.  He was euvolemic at the time of his visit.  Plan for [facility] after completing 4 weeks of AC post cardioversion.  Discussed the use of AI scribe software for clinical note transcription with the patient, who gave verbal consent to proceed.  The patient presents for follow-up. Patient denies shortness of breath, dyspnea on exertion, lower extremity edema, orthopnea or PND. No chest pain, pressure, or tightness. No palpitations.  He has no cardiac awareness of arrhythmia. When he was in afib he felt short of breath. He is now 4 weeks out from cardioversion. Discussed proceeding with heart catheterization. He is in agreement.         ROS: All other systems reviewed and are otherwise negative except as noted in History of Present Illness.  Studies Reviewed    EKG Interpretation Date/Time:  Friday December 28 2022 08:02:57 EST Ventricular Rate:  62 PR Interval:  188 QRS Duration:  114 QT Interval:  482 QTC Calculation: 489 R Axis:   -  58  Text Interpretation: Sinus rhythm with Premature atrial complexes Left axis deviation Right bundle branch block T wave abnormality, consider lateral ischemia When compared with ECG of 14-Dec-2022 09:27, Premature atrial complexes are now Present Right bundle branch block is now Present Confirmed by Carlos Levering [phone]) on 12/28/2022 8:14:12 AM    Risk Assessment/Calculations     CHA2DS2-VASc Score = 2   This indicates a 2.2%  annual risk of stroke. The patient's score is based upon: CHF History: 1 HTN History: 0 Diabetes History: 1 Stroke History: 0 Vascular Disease History: 0 Age Score: 0 Gender Score: 0             Physical Exam    VS:  BP 110/68 (BP Location: Left Arm, Patient Position: Sitting, Cuff Size: Normal)   Pulse 62   Ht 5\' 6"  (1.676 m)   Wt 299 lb 6.4 oz (135.8 kg)   SpO2 97%   BMI 48.32 kg/m  , BMI Body mass index is 48.32 kg/m.  GEN: Well nourished, well developed, in no acute distress. Neck: No JVD or carotid bruits. Cardiac:  RRR. No murmurs. No rubs or gallops.   Respiratory:  Respirations regular and unlabored. Clear to auscultation without rales, wheezing or rhonchi. GI: Soft, nontender, nondistended. Extremities: Radials/DP/PT 2+ and equal bilaterally. No clubbing or cyanosis. No edema/  Skin: Warm and dry, no rash. Neuro: Strength intact.  Assessment & Plan      PAF Onset October 2024.  S/p DCCV 11/29/2022.  Patient denies palpitations or shortness of breath. EKG today shows sinus rhythm with PACs..  Denies spontaneous bleeding concerns. RRR on exam.  -Continue amiodarone, Toprol, Eliquis. -EP evaluation scheduled with Dr. Lalla Brothers on 01/09/2023.   Chronic HFrEF/?Tachy-mediated cardiomyopathy Echo October 2024 showed EF 30 to 35%, global hypokinesis, moderately reduced RV function, mild LVH/RVH, moderately elevated PA pressure.  Patient denies lower extremity edema, shortness of breath, DOE, orthopnea or PND.  Euvolemic and well compensated on exam. -Continue Toprol, Lasix, Jardiance. -Unable to initiate ACE/ARB/ARNI or MRA secondary to soft BP. -Will schedule for [facility].         Disposition: R/LHC. CBC and BMP today. Keep previously scheduled visit with Dr. Lalla Brothers on 12/4. Return 2 weeks post cath or sooner as needed.       Informed Consent   Shared Decision Making/Informed Consent The risks [stroke (1 in 1000), death (1 in 1000), kidney failure [usually  temporary] (1 in 500), bleeding (1 in 200), allergic reaction [possibly serious] (1 in 200)], benefits (diagnostic support and management of coronary artery disease) and alternatives of a cardiac catheterization were discussed in detail with Casey Velez and he is willing to proceed.      Signed, Etta Grandchild. Mack Thurmon, DNP, NP-C

## 2022-12-28 ENCOUNTER — Ambulatory Visit: Payer: 59 | Attending: Student | Admitting: Student

## 2022-12-28 ENCOUNTER — Telehealth: Payer: Self-pay | Admitting: *Deleted

## 2022-12-28 ENCOUNTER — Encounter: Payer: Self-pay | Admitting: Student

## 2022-12-28 VITALS — BP 110/68 | HR 62 | Ht 66.0 in | Wt 299.4 lb

## 2022-12-28 DIAGNOSIS — I480 Paroxysmal atrial fibrillation: Secondary | ICD-10-CM

## 2022-12-28 DIAGNOSIS — I5022 Chronic systolic (congestive) heart failure: Secondary | ICD-10-CM | POA: Diagnosis not present

## 2022-12-28 NOTE — Patient Instructions (Addendum)
Medication Instructions:  No changes at this time.   *If you need a refill on your cardiac medications before your next appointment, please call your pharmacy*   Lab Work: CBC & BMP today here in the office.   If you have labs (blood work) drawn today and your tests are completely normal, you will receive your results only by: MyChart Message (if you have MyChart) OR A paper copy in the mail If you have any lab test that is abnormal or we need to change your treatment, we will call you to review the results.   Testing/Procedures:  [place] [facility] [place] [name] [place] [address] Dept: [phone] Loc: [phone]  Casey Velez  12/28/2022  You are scheduled for a Cardiac Catheterization on Monday, December 2 with Dr. Lorine Bears.  1. Please arrive at the [facility] Entrance of [facility], [address], [place] [address] at 8:30 AM (This is 1 hour(s) prior to your procedure time).  Proceed to the Check-In Desk directly inside the entrance.  Procedure Parking: Use the entrance off of the [address] side of the hospital. Turn right upon entering and follow the driveway to parking that is directly in front of the [facility] Vascular Center. There is no valet parking available at this entrance, however there is an awning directly in front of the Heart & Vascular Center for drop off/ pick up for patients.  Special note: Every effort is made to have your procedure done on time. Please understand that emergencies sometimes delay scheduled procedures.  2. Diet: Do not eat solid foods after midnight.  The patient may have clear liquids until 5am upon the day of the procedure.  3. Labs: You will need to have blood drawn today here in the office.    4. Medication instructions in preparation for your procedure:   Contrast Allergy: No   Stop taking Eliquis  (Apixiban) on Saturday, November 30.  Hold Furosemide (Lasix) the morning of your procedure.    On the morning of your procedure, take your Aspirin 81 mg and any morning medicines NOT listed above.  You may use sips of water.  5. Plan to go home the same day, you will only stay overnight if medically necessary. 6. Bring a current list of your medications and current insurance cards. 7. You MUST have a responsible person to drive you home. 8. Someone MUST be with you the first 24 hours after you arrive home or your discharge will be delayed. 9. Please wear clothes that are easy to get on and off and wear slip-on shoes.  Thank you for allowing [place] to care for you!   -- [facility]    Follow-Up: At [facility], you and your health needs are our priority.  As part of our continuing mission to provide you with exceptional heart care, we have created designated Provider Care Teams.  These Care Teams include your primary Cardiologist (physician) and Advanced Practice Providers (APPs -  Physician Assistants and Nurse Practitioners) who all work together to provide you with the care you need, when you need it.    Your next appointment:   2 week(s)  Provider:   Lorine Bears, MD or Carlos Levering, NP

## 2022-12-28 NOTE — Telephone Encounter (Signed)
Left voicemail message for patient to call back to verify updated procedure date & time.   Scheduled for 01/11/23 at 07:30 am and arrival time of 06:30 am

## 2022-12-28 NOTE — Telephone Encounter (Signed)
Spoke with patient and rescheduled his procedure for 01/11/23 at 07:30 am

## 2022-12-28 NOTE — Telephone Encounter (Signed)
Patient called back requesting to move the day to a Friday. Advised that we could do that and I will give him a call back with the time.   Spoke with Britta Mccreedy and she moved him to that Friday with same arrival time of 08:30 am. Will reach back out to patient with this updated date & time.

## 2022-12-29 LAB — CBC
Hematocrit: 49.6 % (ref 37.5–51.0)
Hemoglobin: 15.7 g/dL (ref 13.0–17.7)
MCH: 28 pg (ref 26.6–33.0)
MCHC: 31.7 g/dL (ref 31.5–35.7)
MCV: 88 fL (ref 79–97)
Platelets: 202 10*3/uL (ref 150–450)
RBC: 5.61 x10E6/uL (ref 4.14–5.80)
RDW: 14.4 % (ref 11.6–15.4)
WBC: 6.5 10*3/uL (ref 3.4–10.8)

## 2022-12-29 LAB — BASIC METABOLIC PANEL
BUN/Creatinine Ratio: 18 (ref 9–20)
BUN: 18 mg/dL (ref 6–24)
CO2: 23 mmol/L (ref 20–29)
Calcium: 9.6 mg/dL (ref 8.7–10.2)
Chloride: 103 mmol/L (ref 96–106)
Creatinine, Ser: 1.02 mg/dL (ref 0.76–1.27)
Glucose: 108 mg/dL — ABNORMAL HIGH (ref 70–99)
Potassium: 4.6 mmol/L (ref 3.5–5.2)
Sodium: 140 mmol/L (ref 134–144)
eGFR: 87 mL/min/{1.73_m2} (ref >59)

## 2023-01-09 ENCOUNTER — Institutional Professional Consult (permissible substitution): Payer: PRIVATE HEALTH INSURANCE | Admitting: Cardiology

## 2023-01-11 ENCOUNTER — Other Ambulatory Visit: Payer: Self-pay

## 2023-01-11 ENCOUNTER — Encounter
Admission: RE | Disposition: A | Discharge status: Home / Self Care | Payer: Self-pay | Source: Home / Self Care | Attending: Cardiovascular Disease

## 2023-01-11 ENCOUNTER — Ambulatory Visit
Admission: RE | Admit: 2023-01-11 | Discharge: 2023-01-11 | Disposition: A | Discharge status: Home / Self Care | Payer: PRIVATE HEALTH INSURANCE | Attending: Cardiovascular Disease | Admitting: Cardiovascular Disease

## 2023-01-11 ENCOUNTER — Other Ambulatory Visit: Payer: Self-pay | Admitting: *Deleted

## 2023-01-11 ENCOUNTER — Encounter: Payer: Self-pay | Admitting: Cardiovascular Disease

## 2023-01-11 DIAGNOSIS — I509 Heart failure, unspecified: Secondary | ICD-10-CM

## 2023-01-11 DIAGNOSIS — E785 Hyperlipidemia, unspecified: Secondary | ICD-10-CM | POA: Insufficient documentation

## 2023-01-11 DIAGNOSIS — E119 Type 2 diabetes mellitus without complications: Secondary | ICD-10-CM | POA: Insufficient documentation

## 2023-01-11 DIAGNOSIS — I428 Other cardiomyopathies: Secondary | ICD-10-CM | POA: Insufficient documentation

## 2023-01-11 DIAGNOSIS — I2510 Atherosclerotic heart disease of native coronary artery without angina pectoris: Secondary | ICD-10-CM | POA: Diagnosis not present

## 2023-01-11 DIAGNOSIS — I5022 Chronic systolic (congestive) heart failure: Secondary | ICD-10-CM | POA: Diagnosis not present

## 2023-01-11 DIAGNOSIS — I480 Paroxysmal atrial fibrillation: Secondary | ICD-10-CM | POA: Insufficient documentation

## 2023-01-11 DIAGNOSIS — G4733 Obstructive sleep apnea (adult) (pediatric): Secondary | ICD-10-CM | POA: Diagnosis not present

## 2023-01-11 DIAGNOSIS — K219 Gastro-esophageal reflux disease without esophagitis: Secondary | ICD-10-CM | POA: Insufficient documentation

## 2023-01-11 DIAGNOSIS — Z79899 Other long term (current) drug therapy: Secondary | ICD-10-CM | POA: Insufficient documentation

## 2023-01-11 DIAGNOSIS — Z7901 Long term (current) use of anticoagulants: Secondary | ICD-10-CM | POA: Diagnosis not present

## 2023-01-11 HISTORY — PX: RIGHT/LEFT HEART CATH AND CORONARY ANGIOGRAPHY: CATH118266

## 2023-01-11 LAB — GLUCOSE, CAPILLARY
Glucose-Capillary: 118 mg/dL — ABNORMAL HIGH (ref 70–99)
Glucose-Capillary: 110 mg/dL — ABNORMAL HIGH (ref 70–99)

## 2023-01-11 LAB — POCT I-STAT 7, (LYTES, BLD GAS, ICA,H+H)
Acid-base deficit: 2 mmol/L (ref 0.0–2.0)
Bicarbonate: 23.2 mmol/L (ref 20.0–28.0)
Calcium, Ion: 1.24 mmol/L (ref 1.15–1.40)
HCT: 44 % (ref 39.0–52.0)
Hemoglobin: 15 g/dL (ref 13.0–17.0)
O2 Saturation: 94 %
Potassium: 4.2 mmol/L (ref 3.5–5.1)
Sodium: 140 mmol/L (ref 135–145)
TCO2: 24 mmol/L (ref 22–32)
pCO2 arterial: 39.7 mm[Hg] (ref 32–48)
pH, Arterial: 7.375 (ref 7.35–7.45)
pO2, Arterial: 72 mm[Hg] — ABNORMAL LOW (ref 83–108)

## 2023-01-11 SURGERY — RIGHT/LEFT HEART CATH AND CORONARY ANGIOGRAPHY
Anesthesia: Moderate Sedation | Laterality: Bilateral

## 2023-01-11 MED ORDER — VERAPAMIL HCL 2.5 MG/ML IV SOLN
INTRAVENOUS | Status: DC | PRN
  Administered 2023-01-11: 2.5 mg via INTRA_ARTERIAL

## 2023-01-11 MED ORDER — SODIUM CHLORIDE 0.9% FLUSH
3.0000 mL | Freq: Two times a day (BID) | INTRAVENOUS | Status: DC
Start: 2023-01-11 — End: 2023-01-11

## 2023-01-11 MED ORDER — LIDOCAINE HCL 1 % IJ SOLN
INTRAMUSCULAR | Status: AC
Start: 2023-01-11 — End: ?
  Filled 2023-01-11: qty 20

## 2023-01-11 MED ORDER — HEPARIN (PORCINE) IN NACL 2000-0.9 UNIT/L-% IV SOLN
INTRAVENOUS | Status: DC | PRN
  Administered 2023-01-11: 1000 mL

## 2023-01-11 MED ORDER — LIDOCAINE HCL (PF) 1 % IJ SOLN
INTRAMUSCULAR | Status: DC | PRN
  Administered 2023-01-11: 2 mL

## 2023-01-11 MED ORDER — SODIUM CHLORIDE 0.9% FLUSH: 3.0000 mL | INTRAVENOUS | Status: DC | PRN

## 2023-01-11 MED ORDER — SODIUM CHLORIDE 0.9 % IV SOLN: INTRAVENOUS | Status: DC

## 2023-01-11 MED ORDER — IOHEXOL 300 MG/ML  SOLN
INTRAMUSCULAR | Status: DC | PRN
  Administered 2023-01-11: 42 mL

## 2023-01-11 MED ORDER — SODIUM CHLORIDE 0.9 % WEIGHT BASED INFUSION: 1.0000 mL/kg/h | INTRAVENOUS | Status: DC

## 2023-01-11 MED ORDER — ONDANSETRON HCL 4 MG/2ML IJ SOLN: 4.0000 mg | Freq: Four times a day (QID) | INTRAMUSCULAR | Status: DC | PRN

## 2023-01-11 MED ORDER — ASPIRIN 81 MG PO CHEW: 81.0000 mg | CHEWABLE_TABLET | ORAL | Status: DC

## 2023-01-11 MED ORDER — FENTANYL CITRATE (PF) 100 MCG/2ML IJ SOLN
INTRAMUSCULAR | Status: AC
  Filled 2023-01-11: qty 2

## 2023-01-11 MED ORDER — HEPARIN SODIUM (PORCINE) 1000 UNIT/ML IJ SOLN
INTRAMUSCULAR | Status: DC | PRN
  Administered 2023-01-11: 5000 [IU] via INTRAVENOUS

## 2023-01-11 MED ORDER — HEPARIN SODIUM (PORCINE) 1000 UNIT/ML IJ SOLN
INTRAMUSCULAR | Status: AC
  Filled 2023-01-11: qty 10

## 2023-01-11 MED ORDER — SODIUM CHLORIDE 0.9 % IV SOLN: 250.0000 mL | INTRAVENOUS | Status: DC | PRN

## 2023-01-11 MED ORDER — APIXABAN 5 MG PO TABS: 5.0000 mg | ORAL_TABLET | Freq: Two times a day (BID) | ORAL | 6 refills | Status: DC

## 2023-01-11 MED ORDER — SODIUM CHLORIDE 0.9 % WEIGHT BASED INFUSION: 3.0000 mL/kg/h | INTRAVENOUS | Status: AC

## 2023-01-11 MED ORDER — AMIODARONE HCL 200 MG PO TABS: 200.0000 mg | ORAL_TABLET | Freq: Two times a day (BID) | ORAL | 6 refills | Status: DC

## 2023-01-11 MED ORDER — MIDAZOLAM HCL 2 MG/2ML IJ SOLN
INTRAMUSCULAR | Status: AC
Start: 2023-01-11 — End: ?
  Filled 2023-01-11: qty 2

## 2023-01-11 MED ORDER — HEPARIN (PORCINE) IN NACL 1000-0.9 UT/500ML-% IV SOLN
INTRAVENOUS | Status: AC
Start: 2023-01-11 — End: ?
  Filled 2023-01-11: qty 1000

## 2023-01-11 MED ORDER — VERAPAMIL HCL 2.5 MG/ML IV SOLN
INTRAVENOUS | Status: AC
Start: 2023-01-11 — End: ?
  Filled 2023-01-11: qty 2

## 2023-01-11 MED ORDER — MIDAZOLAM HCL 2 MG/2ML IJ SOLN
INTRAMUSCULAR | Status: DC | PRN
  Administered 2023-01-11: 1 mg via INTRAVENOUS

## 2023-01-11 MED ORDER — ACETAMINOPHEN 325 MG PO TABS
650.0000 mg | ORAL_TABLET | ORAL | Status: DC | PRN
Start: 2023-01-11 — End: 2023-01-11

## 2023-01-11 SURGICAL SUPPLY — 13 items
CATH BALLN WEDGE 5F 110CM (CATHETERS) IMPLANT
CATH INFINITI AMBI 5FR JK (CATHETERS) IMPLANT
DEVICE RAD TR BAND REGULAR (VASCULAR PRODUCTS) IMPLANT
DRAPE BRACHIAL (DRAPES) IMPLANT
GLIDESHEATH SLEND SS 6F .021 (SHEATH) IMPLANT
GUIDEWIRE INQWIRE 1.5J.035X260 (WIRE) IMPLANT
INQWIRE 1.5J .035X260CM (WIRE) ×1
KIT SYRINGE INJ CVI SPIKEX1 (MISCELLANEOUS) IMPLANT
PACK CARDIAC CATH (CUSTOM PROCEDURE TRAY) ×1 IMPLANT
PROTECTION STATION PRESSURIZED (MISCELLANEOUS) ×1
SET ATX-X65L (MISCELLANEOUS) IMPLANT
SHEATH GLIDE SLENDER 4/5FR (SHEATH) IMPLANT
STATION PROTECTION PRESSURIZED (MISCELLANEOUS) IMPLANT

## 2023-01-11 NOTE — Interval H&P Note (Signed)
History and Physical Interval Note:  01/11/2023 7:59 AM  Casey Velez  has presented today for surgery, with the diagnosis of R and L Clath    HF   EKG abnormality.  The various methods of treatment have been discussed with the patient and family. After consideration of risks, benefits and other options for treatment, the patient has consented to  Procedure(s): RIGHT/LEFT HEART CATH AND CORONARY ANGIOGRAPHY (Bilateral) as a surgical intervention.  The patient's history has been reviewed, patient examined, no change in status, stable for surgery.  I have reviewed the patient's chart and labs.  Questions were answered to the patient's satisfaction.     Lorine Bears

## 2023-01-14 ENCOUNTER — Encounter: Payer: Self-pay | Admitting: Cardiovascular Disease

## 2023-01-14 LAB — POCT I-STAT EG7
Acid-base deficit: 2 mmol/L (ref 0.0–2.0)
Bicarbonate: 25.2 mmol/L (ref 20.0–28.0)
Calcium, Ion: 1.24 mmol/L (ref 1.15–1.40)
HCT: 44 % (ref 39.0–52.0)
Hemoglobin: 15 g/dL (ref 13.0–17.0)
O2 Saturation: 61 %
Potassium: 4.2 mmol/L (ref 3.5–5.1)
Sodium: 140 mmol/L (ref 135–145)
TCO2: 27 mmol/L (ref 22–32)
pCO2, Ven: 49.4 mm[Hg] (ref 44–60)
pH, Ven: 7.317 (ref 7.25–7.43)
pO2, Ven: 35 mm[Hg] (ref 32–45)

## 2023-01-14 MED ORDER — METOPROLOL SUCCINATE ER 25 MG PO TB24: 25.0000 mg | ORAL_TABLET | Freq: Two times a day (BID) | ORAL | 3 refills | Status: DC

## 2023-01-14 MED ORDER — APIXABAN 5 MG PO TABS: 5.0000 mg | ORAL_TABLET | Freq: Two times a day (BID) | ORAL | 3 refills | Status: DC

## 2023-01-14 MED ORDER — AMIODARONE HCL 200 MG PO TABS: 200.0000 mg | ORAL_TABLET | Freq: Two times a day (BID) | ORAL | 3 refills | Status: DC

## 2023-01-24 ENCOUNTER — Ambulatory Visit: Payer: 59 | Admitting: Student

## 2023-01-28 NOTE — Progress Notes (Unsigned)
Cardiology Clinic Note   Date: 01/29/2023 ID: Casey Velez, Casey Velez 01-19-1966, MRN [id number]  Primary Cardiologist:  Lorine Bears, MD  Patient Profile    Casey Velez is a 57 y.o. male who presents to the clinic today for follow up after heart cath.     Past medical history significant for: Nonobstructive CAD. R/LHC 01/11/2023: Proximal to mid LAD 30%.  Moderate to severe left ventricular systolic assessment, EF 30%.  Normal filling pressures, normal pulmonary pressure and moderately to severely reduced cardiac index.  A-fib with RVR noted before starting case.  Recommend cardioversion and EP consult.  Cardiomyopathy likely tachycardia induced. PAF. Onset October 2024. TEE/DCCV 11/26/2022: No LA/LAA thrombus.  Unsuccessful after 3 shocks. DCCV 11/29/2022: Successful after 3 shocks. Chronic HFrEF. Echo 11/19/2022: EF 30 to 35%.  Global hypokinesis.  Mild LVH.  Diastolic function could not be evaluated.  Moderately reduced RV function.  Mild RVH.  Moderately elevated PA pressure.  Mild LAE.  Small pericardial effusion.  Mild MR.  Mild to moderate TR.  Mild dilatation of ascending aorta 42 mm.  Dilated IVC, RA pressure 15 mmHg. OSA. On CPAP. Hyperlipidemia. Lipid panel 11/19/2022: LDL 33, HDL 18, TG 25, total 56. T2DM. GERD.  In summary, patient presented to the ED on 11/19/2022 with complaints of exertional shortness of breath and productive cough x 1 month.  EKG revealed A-fib with RVR rates in the 150s.  He was started on IV diltiazem.  Chest x-ray showed no acute process.  Initial labs: Hemoglobin 14.9, potassium 4.0, sodium 140, calcium 8.8, creatinine 1.27, magnesium 1.9, BNP 334.5, TSH 2.290.  Troponin 8>> 9.  Echo showed moderate to severely decreased LV function (details above).  Diltiazem was discontinued and patient was started on metoprolol for rate control.  Plan for TEE/DCCV once volume optimized.  Rates continue to be labile on metoprolol.  Given digoxin IV x 2 prior to  transition to oral digoxin.  Heart rates improved to 90-100s, higher with ambulation.  Unsuccessful DCCV on 11/26/2022.  It was felt obesity and OSA contributing to unsuccessful attempts.  Patient started on amiodarone infusion but subsequently discontinued secondary to concern for QT prolongation.  Rhythm strips were reviewed and QT was felt to be within normal limits with variable QTc in setting of A-fib.  Amiodarone was resumed.  Successful DCCV on 11/29/2022.  Patient transition to p.o. amiodarone and discharged on 11/30/2022.  Patient was seen in follow-up on 12/14/2022.  He reported he was doing well with no chest pain, shortness of breath, or palpitations.  He was maintaining sinus rhythm however EKG showed new anterolateral T wave abnormalities.  He was provided with ED precautions and scheduled to return to be set up for [facility] after 4 weeks of uninterrupted anticoagulation post cardioversion.       History of Present Illness    Casey Velez is followed by Dr. Kirke Velez for the above outlined history.  Patient was last seen in the office on 12/28/2022 for follow-up.  He was maintaining sinus rhythm and did not report any concerning cardiac symptoms.  He underwent R/LHC on 01/11/2023 which showed mild nonobstructive CAD and normal filling pressures, pulmonary pressure and moderate to severely reduced cardiac index.  Unfortunately he was back in A-fib with RVR.  It was felt cardiomyopathy likely tachycardia induced.  Today, patient is doing well. He is in sinus rhythm today. He denies cardiac awareness of arhythmia. Patient denies shortness of breath, dyspnea on exertion, lower extremity edema, orthopnea or  PND. No abdominal bloating/fullness. Daily weight has been stable with no big overnight increases. No chest pain, pressure, or tightness. He continues to limit sodium intake. Discussed results of heart cath. All questions answered.      ROS: All other systems reviewed and are otherwise negative  except as noted in History of Present Illness.  Studies Reviewed    EKG Interpretation Date/Time:  Tuesday January 29 2023 08:55:23 EST Ventricular Rate:  54 PR Interval:  198 QRS Duration:  110 QT Interval:  460 QTC Calculation: 436 R Axis:   -50  Text Interpretation: Sinus bradycardia Right bundle branch block Left anterior fascicular block Bifascicular block T wave abnormality, consider lateral ischemia When compared with ECG of 28-Dec-2022 08:02, Premature atrial complexes are no longer Present QT has shortened Confirmed by Carlos Levering [phone] on 01/29/2023 9:12:44 AM   Risk Assessment/Calculations     CHA2DS2-VASc Score = 2   This indicates a 2.2% annual risk of stroke. The patient's score is based upon: CHF History: 1 HTN History: 0 Diabetes History: 1 Stroke History: 0 Vascular Disease History: 0 Age Score: 0 Gender Score: 0             Physical Exam    VS:  BP 106/66 (BP Location: Left Arm, Patient Position: Sitting, Cuff Size: Large)   Pulse (!) 54   Ht 5\' 6"  (1.676 m)   Wt (!) 303 lb (137.4 kg)   SpO2 97%   BMI 48.91 kg/m  , BMI Body mass index is 48.91 kg/m.  GEN: Well nourished, well developed, in no acute distress. Neck: No JVD or carotid bruits. Cardiac:  RRR. No murmurs. No rubs or gallops.   Respiratory:  Respirations regular and unlabored. Clear to auscultation without rales, wheezing or rhonchi. GI: Soft, nontender, nondistended. Extremities: Radials/DP/PT 2+ and equal bilaterally. No clubbing or cyanosis. No edema.  Skin: Warm and dry, no rash. Neuro: Strength intact.  Assessment & Plan   Nonobstructive CAD Per angiography December 2024.  Patient denies chest pain,pressure or tightness.  -Continue Toprol, atorvastatin.  Not on aspirin secondary to Eliquis.  PAF Onset October 2024.  S/p DCCV 11/29/2022.  Patient denies cardiac awareness of arrhythmia.  Denies spontaneous bleeding concerns.  EKG shows sinus bradycardia.  -Continue  amiodarone, Toprol, Eliquis. -EP evaluation scheduled with Dr. Jimmey Ralph on 02/12/2023.   Chronic HFrEF/Tachy-mediated cardiomyopathy Echo October 2024 showed EF 30 to 35%, global hypokinesis, moderately reduced RV function, mild LVH/RVH, moderately elevated PA pressure.  [facility] December 2024 showed normal filling pressures, pulmonary pressure, and moderate to severely reduced cardiac index.  Patient denies lower extremity edema, abdominal bloating/fullness, shortness of breath, DOE, orthopnea or PND. Daily weight stable with no big overnight increases. Brisk diuresis on current dose of Lasix. Euvolemic and well compensated on exam.  -Continue Toprol, Lasix, Jardiance. -Unable to initiate ACE/ARB/ARNI or MRA secondary to soft BP.  Disposition: Keep visit with Dr. Jimmey Ralph on 1/7. Return in 4 months or sooner as needed.          Signed, Etta Grandchild. Tesslyn Baumert, DNP, NP-C

## 2023-01-29 ENCOUNTER — Ambulatory Visit: Payer: PRIVATE HEALTH INSURANCE | Attending: Student | Admitting: Student

## 2023-01-29 ENCOUNTER — Encounter: Payer: Self-pay | Admitting: Student

## 2023-01-29 VITALS — BP 106/66 | HR 54 | Ht 66.0 in | Wt 303.0 lb

## 2023-01-29 DIAGNOSIS — I2510 Atherosclerotic heart disease of native coronary artery without angina pectoris: Secondary | ICD-10-CM | POA: Diagnosis not present

## 2023-01-29 DIAGNOSIS — I480 Paroxysmal atrial fibrillation: Secondary | ICD-10-CM | POA: Diagnosis not present

## 2023-01-29 DIAGNOSIS — I5022 Chronic systolic (congestive) heart failure: Secondary | ICD-10-CM

## 2023-01-29 DIAGNOSIS — I43 Cardiomyopathy in diseases classified elsewhere: Secondary | ICD-10-CM

## 2023-01-29 DIAGNOSIS — R000 Tachycardia, unspecified: Secondary | ICD-10-CM | POA: Diagnosis not present

## 2023-01-29 NOTE — Patient Instructions (Signed)
Medication Instructions:  Your physician recommends that you continue on your current medications as directed. Please refer to the Current Medication list given to you today.   *If you need a refill on your cardiac medications before your next appointment, please call your pharmacy*   Lab Work: No labs ordered today    Testing/Procedures: No test ordered today    Follow-Up: At [facility], you and your health needs are our priority.  As part of our continuing mission to provide you with exceptional heart care, we have created designated Provider Care Teams.  These Care Teams include your primary Cardiologist (physician) and Advanced Practice Providers (APPs -  Physician Assistants and Nurse Practitioners) who all work together to provide you with the care you need, when you need it.  We recommend signing up for the patient portal called "MyChart".  Sign up information is provided on this After Visit Summary.  MyChart is used to connect with patients for Virtual Visits (Telemedicine).  Patients are able to view lab/test results, encounter notes, upcoming appointments, etc.  Non-urgent messages can be sent to your provider as well.   To learn more about what you can do with MyChart, go to [email or web address].    Your next appointment:   4 month(s)  Provider:    Lorine Bears, MD

## 2023-02-05 LAB — HM DIABETES EYE EXAM

## 2023-02-11 NOTE — Progress Notes (Signed)
 Patient canceled

## 2023-02-12 ENCOUNTER — Ambulatory Visit (INDEPENDENT_AMBULATORY_CARE_PROVIDER_SITE_OTHER): Payer: PRIVATE HEALTH INSURANCE | Admitting: Cardiology

## 2023-02-12 ENCOUNTER — Encounter: Payer: Self-pay | Admitting: Cardiology

## 2023-02-12 VITALS — BP 115/82 | HR 60 | Ht 66.0 in | Wt 312.2 lb

## 2023-02-12 DIAGNOSIS — I4819 Other persistent atrial fibrillation: Secondary | ICD-10-CM

## 2023-02-16 IMAGING — US US PELVIS LIMITED
1 series · 14 of 17 positions shown · non-contrast
Comparison: CT abdomen and pelvis 03/24/2005

CLINICAL DATA: Urinary tension

EXAM:
LIMITED ULTRASOUND OF PELVIS
TECHNIQUE: Limited transabdominal ultrasound examination of the pelvis was
performed to assess the urinary bladder.

[Series 1: us pelvis limited (transabdominal only) · 14 of 17 slices shown]
[im 1/17]
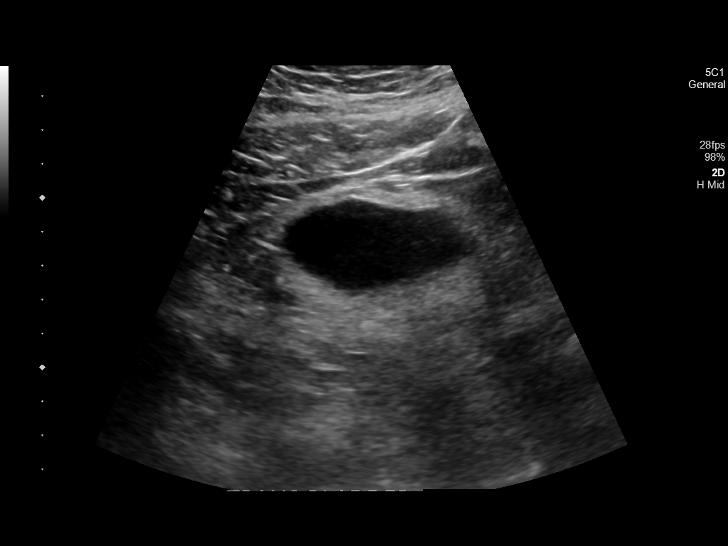
[im 2/17]
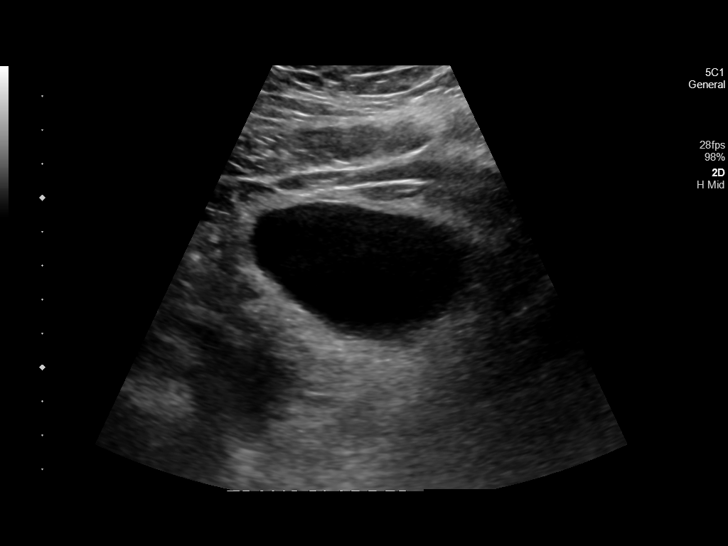
[im 4/17]
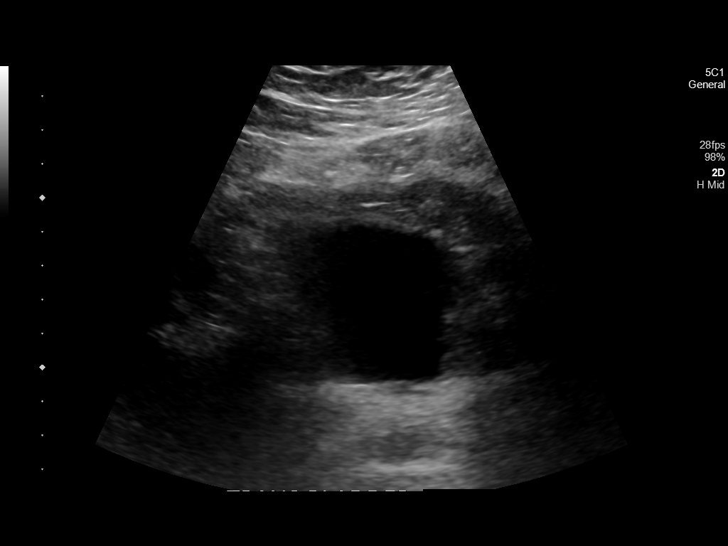
[im 5/17]
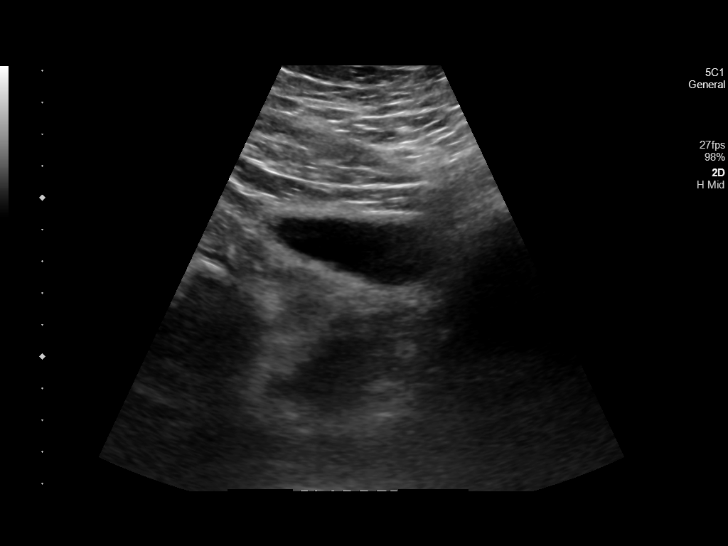
[im 6/17]
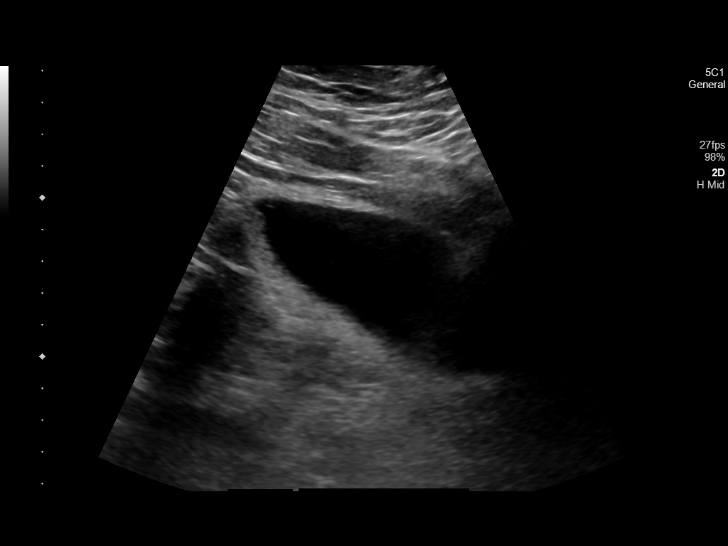
[im 7/17]
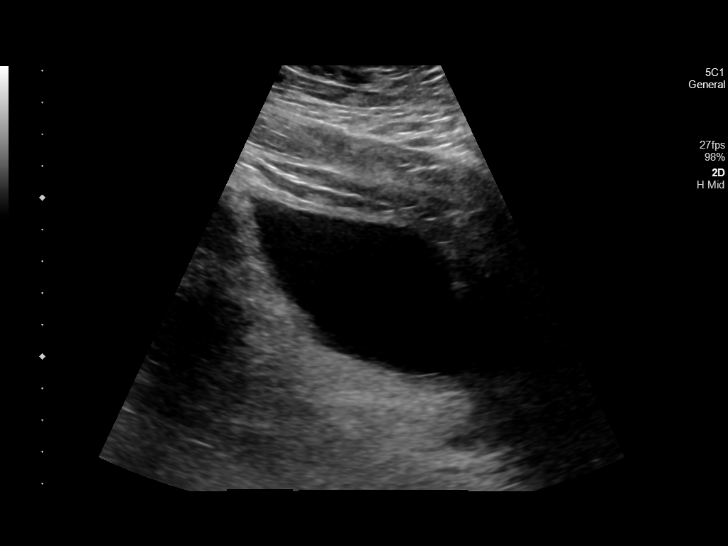
[im 8/17]
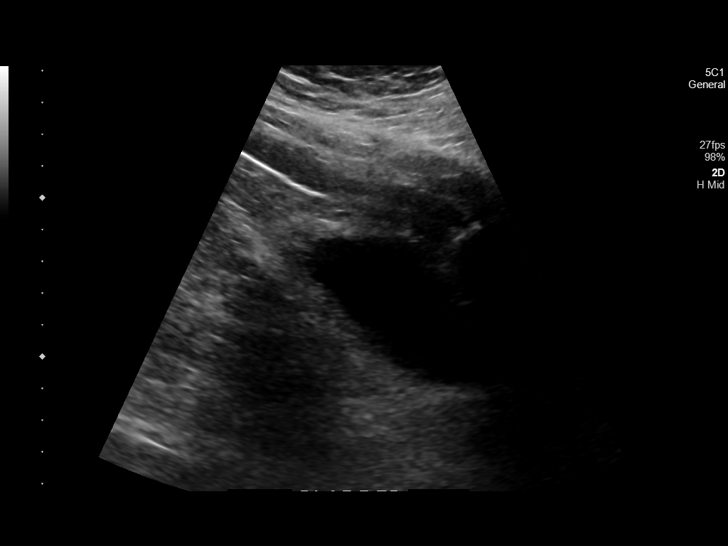
[im 10/17]
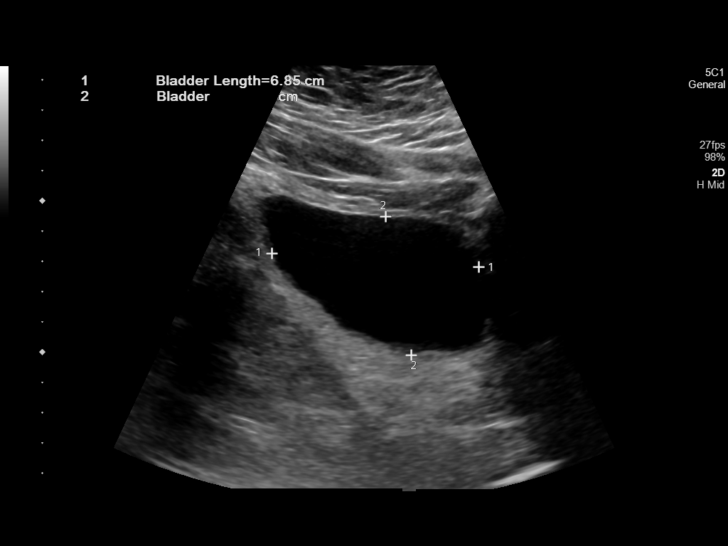
[im 11/17]
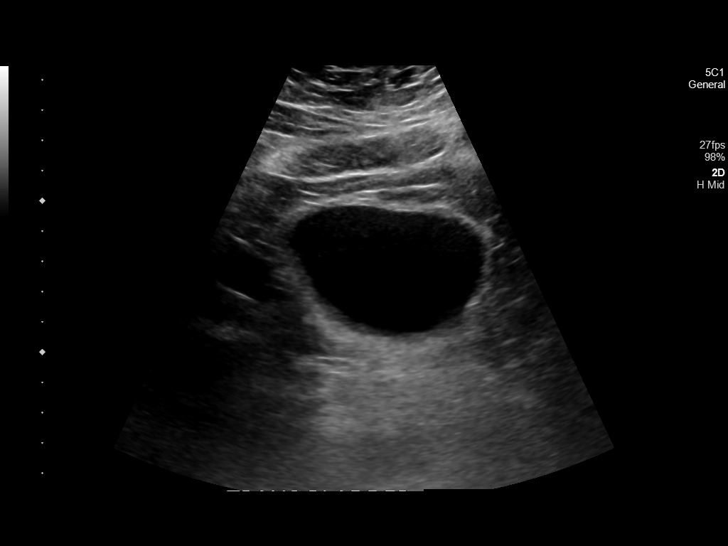
[im 12/17]
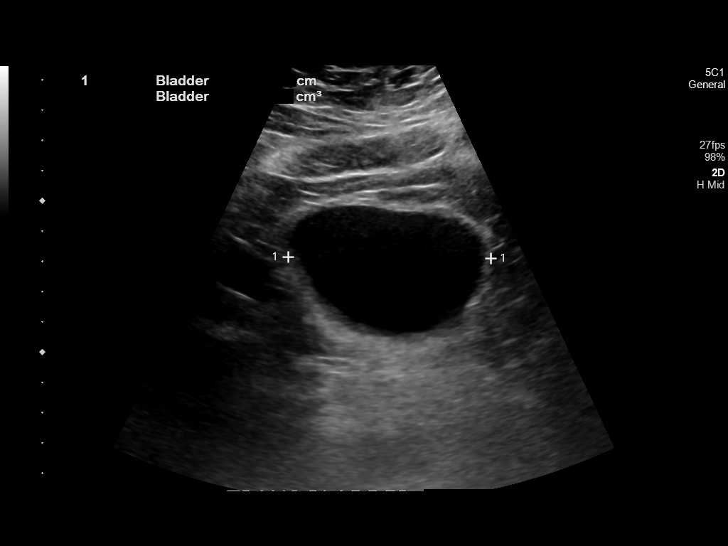
[im 13/17]
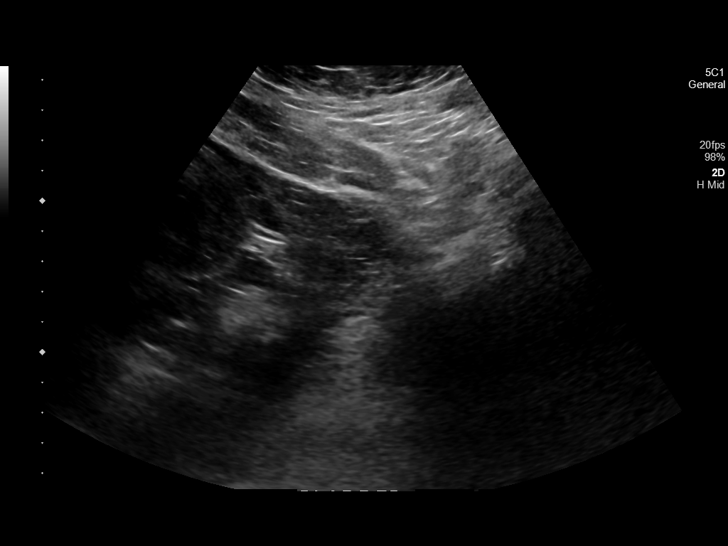
[im 14/17]
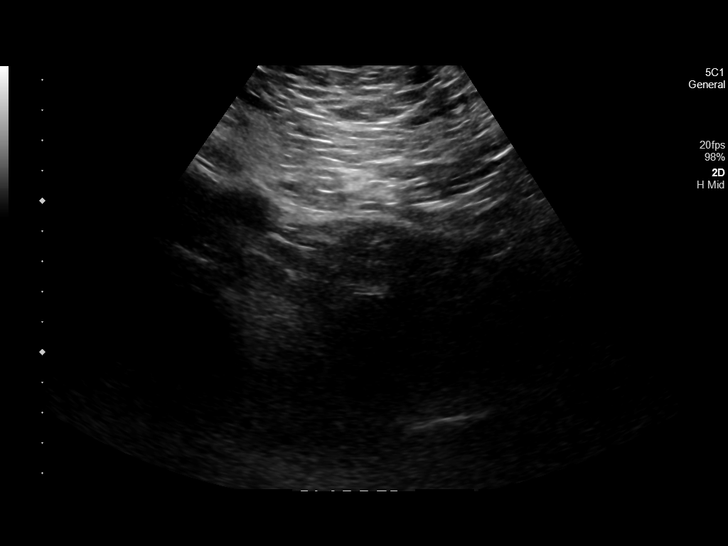
[im 16/17]
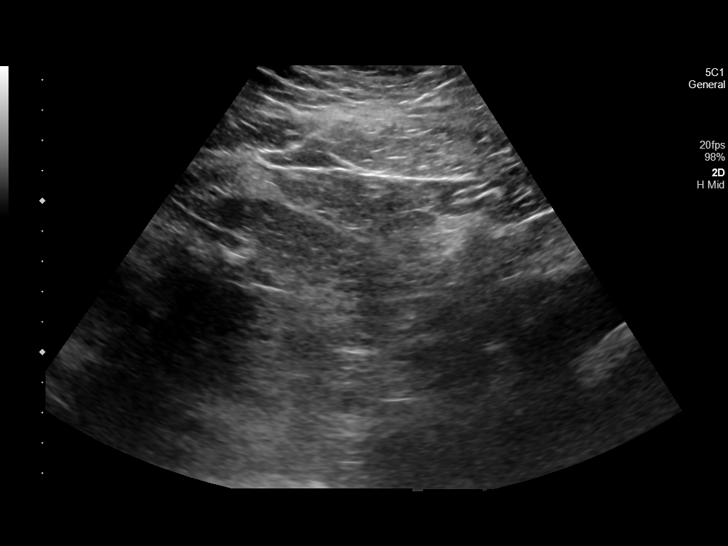
[im 17/17]
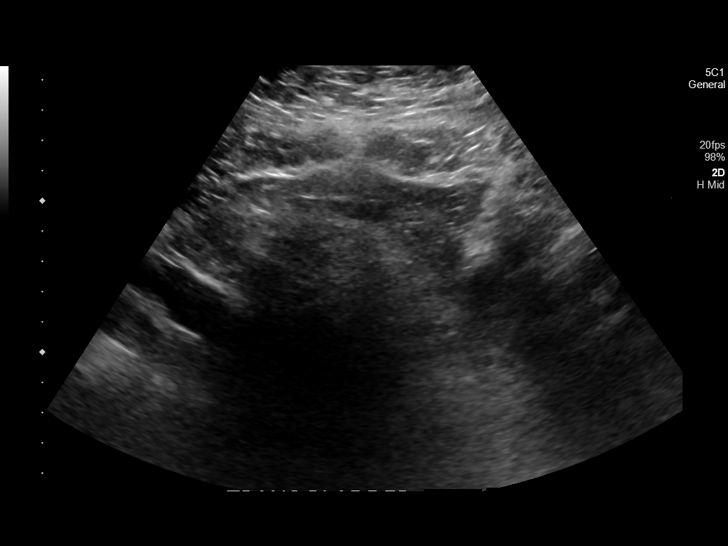

[14 of 17 positions shown; findings below may reference images not displayed]

FINDINGS: Normally distended urinary bladder. No bladder wall thickening or
mass.

Pre-void volume: 112 mL

Post-void volume: 0 mL

Other findings:  N/A
IMPRESSION: Unremarkable sonographic appearance of the urinary bladder.

## 2023-02-18 NOTE — Progress Notes (Deleted)
 Patient canceled

## 2023-02-18 NOTE — Progress Notes (Signed)
 Electrophysiology Office Note:   Date:  02/18/2023  ID:  Casey Velez, DOB 05/19/1965, MRN [id number]   Primary Cardiologist: Deatrice Cage, MD Primary Heart Failure: None Electrophysiologist: Fonda Kitty, MD      History of Present Illness:   Casey Velez is a 58 y.o. male with h/o chronic systolic heart failure secondary to NICM, presumptive tachy-arrhythmia induced who is being seen today for evaluation of atrial fibrillation at the request of Barnie Hila, NP.   Atrial fibrillation diagnosed in October of 2024. Echo at that time, while in AF, showed EF 30-35% with global hypokinesis. Underwent DCCV on 11/29/22. Started on amiodarone . Underwent LHC with no obstructive CAD but was back in AF at time of LHC/RHC.  Patient returned for follow-up appointment on 01/29/2023 and was back in normal sinus rhythm at that time. Patient reports that he feels much worse when in atrial fibrillation. He thinks that he was in AF for approximately 1 month prior to his presentation to ED in October. He developed shortness of breath, dyspnea on exertion and fatigue. These symptoms resolved with conversion to sinus rhythm. Otherwise doing relatively well. No new or acute complaints.   Review of systems complete and found to be negative unless listed in HPI.    EP Information / Studies Reviewed:     EKG is not ordered today. EKG from 01/29/23 reviewed which showed sinus bradycardia, heart rate 54bpm.      EKG 11/19/22: AF    LHC/RHC 01/11/23:   Prox LAD to Mid LAD lesion is 30% stenosed.   There is moderate to severe left ventricular systolic dysfunction.   LV end diastolic pressure is normal.   The left ventricular ejection fraction is 25-35% by visual estimate.   1.  Mild nonobstructive coronary artery disease. 2.  Moderately to severely reduced LV systolic function with an EF of 30%. 3.  Right heart catheterization showed normal filling pressures, normal pulmonary pressure and moderately  to severely reduced cardiac index. 4.  Atrial fibrillation with RVR noted before starting the case.   Echo 11/19/22:   1. Left ventricular ejection fraction, by estimation, is 30 to 35%. The  left ventricle has moderate to severely decreased function. The left  ventricle demonstrates global hypokinesis. The left ventricular internal  cavity size was moderately dilated.  There is mild left ventricular hypertrophy. Left ventricular diastolic  function could not be evaluated.   2. Right ventricular systolic function is moderately reduced. The right  ventricular size is mildly enlarged. There is moderately elevated  pulmonary artery systolic pressure.   3. Left atrial size was mildly dilated.   4. A small pericardial effusion is present.   5. The mitral valve is normal in structure. Mild mitral valve  regurgitation.   6. Tricuspid valve regurgitation is mild to moderate.   7. The aortic valve is tricuspid. Aortic valve regurgitation is not  visualized. No aortic stenosis is present.   8. There is mild dilatation of the ascending aorta, measuring 42 mm.   9. The inferior vena cava is dilated in size with <50% respiratory  variability, suggesting right atrial pressure of 15 mmHg.      Risk Assessment/Calculations:     CHA2DS2-VASc Score = 2  This indicates a 2.2% annual risk of stroke. The patient's score is based upon: CHF History: 1 HTN History: 0 Diabetes History: 1 Stroke History: 0 Vascular Disease History: 0 Age Score: 0 Gender Score: 0       Physical Exam:  Today's Vitals   02/19/23 0808  BP: 122/78  Pulse: 62  SpO2: 98%  Weight: (!) 311 lb 12.8 oz (141.4 kg)  Height: 5' 6 (1.676 m)   Body mass index is 50.33 kg/m.   GEN: Well nourished, well developed in no acute distress NECK: No JVD CARDIAC: Normal rate and regular rhythm. RESPIRATORY:  Clear to auscultation without rales, wheezing or rhonchi  ABDOMEN: Soft, non-tender, non-distended EXTREMITIES:  No  edema; No deformity    ASSESSMENT AND PLAN:   Casey Velez is a 58 y.o. male with h/o chronic systolic heart failure secondary to NICM, presumptive tachy-arrhythmia induced who is being seen today for evaluation of atrial fibrillation at the request of Barnie Hila, NP.   #Persistent atrial fibrillation: Unfortunately, his atrial fibrillation is associated with likely tachy/arrhythmia induced cardiomyopathy.  For this reason, the rhythm control strategy would be best. Patient is looking into changing jobs which would necessitate new health insurance and limited time off. For this reason, there is uncertainty about hospitalization for Tikosyn or whether to undergo an ablation. His BMI is quite high but his body habitus/groin anatomy seems conducive for ablation.  - Discussed treatment options today for AF including antiarrhythmic drug therapy (ultimately do not want to keep on amiodarone  and would necessitate switching to Tikosyn) and ablation. Discussed risks, recovery and likelihood of success with each treatment strategy. Risk, benefits, and alternatives to EP study and ablation for afib were discussed. These risks include but are not limited to stroke, bleeding, vascular damage, tamponade, perforation, damage to the esophagus, lungs, phrenic nerve and other structures, pulmonary vein stenosis, worsening renal function, coronary vasospasm and death.  Discussed potential need for repeat ablation procedures and antiarrhythmic drugs after an initial ablation. The patient understands these risks and would like some additional time to consider his options. For now, we will keep him on amiodarone  as it is working and follow up in 3 months.    #Secondary hypercoagulable state due to atrial fibrillation:  -CHADSVASC score of 2. -Continue Eliquis .   #Chronic systolic heart failure: #NICM, presumptively tachy-arrhythmia induced:  -Appears well compensated today.  Continue follow up with general  cardiology.   Follow up with Dr. Kennyth in 3 months   Signed, Fonda Kennyth, MD

## 2023-02-18 NOTE — H&P (View-Only) (Signed)
 Electrophysiology Office Note:   Date:  02/18/2023  ID:  Casey Velez, DOB 1965-08-10, MRN [id number]   Primary Cardiologist: Lorine Bears, MD Primary Heart Failure: None Electrophysiologist: Nobie Putnam, MD      History of Present Illness:   Casey Velez is a 58 y.o. male with h/o chronic systolic heart failure secondary to NICM, presumptive tachy-arrhythmia induced who is being seen today for evaluation of atrial fibrillation at the request of Carlos Levering, NP.   Atrial fibrillation diagnosed in October of 2024. Echo at that time, while in AF, showed EF 30-35% with global hypokinesis. Underwent DCCV on 11/29/22. Started on amiodarone. Underwent LHC with no obstructive CAD but was back in AF at time of LHC/RHC.  Patient returned for follow-up appointment on 01/29/2023 and was back in normal sinus rhythm at that time. Patient reports that he feels much worse when in atrial fibrillation. He thinks that he was in AF for approximately 1 month prior to his presentation to ED in October. He developed shortness of breath, dyspnea on exertion and fatigue. These symptoms resolved with conversion to sinus rhythm. Otherwise doing relatively well. No new or acute complaints.   Review of systems complete and found to be negative unless listed in HPI.    EP Information / Studies Reviewed:     EKG is not ordered today. EKG from 01/29/23 reviewed which showed sinus bradycardia, heart rate 54bpm.      EKG 11/19/22: AF    LHC/RHC 01/11/23:   Prox LAD to Mid LAD lesion is 30% stenosed.   There is moderate to severe left ventricular systolic dysfunction.   LV end diastolic pressure is normal.   The left ventricular ejection fraction is 25-35% by visual estimate.   1.  Mild nonobstructive coronary artery disease. 2.  Moderately to severely reduced LV systolic function with an EF of 30%. 3.  Right heart catheterization showed normal filling pressures, normal pulmonary pressure and moderately  to severely reduced cardiac index. 4.  Atrial fibrillation with RVR noted before starting the case.   Echo 11/19/22:   1. Left ventricular ejection fraction, by estimation, is 30 to 35%. The  left ventricle has moderate to severely decreased function. The left  ventricle demonstrates global hypokinesis. The left ventricular internal  cavity size was moderately dilated.  There is mild left ventricular hypertrophy. Left ventricular diastolic  function could not be evaluated.   2. Right ventricular systolic function is moderately reduced. The right  ventricular size is mildly enlarged. There is moderately elevated  pulmonary artery systolic pressure.   3. Left atrial size was mildly dilated.   4. A small pericardial effusion is present.   5. The mitral valve is normal in structure. Mild mitral valve  regurgitation.   6. Tricuspid valve regurgitation is mild to moderate.   7. The aortic valve is tricuspid. Aortic valve regurgitation is not  visualized. No aortic stenosis is present.   8. There is mild dilatation of the ascending aorta, measuring 42 mm.   9. The inferior vena cava is dilated in size with <50% respiratory  variability, suggesting right atrial pressure of 15 mmHg.      Risk Assessment/Calculations:     CHA2DS2-VASc Score = 2  This indicates a 2.2% annual risk of stroke. The patient's score is based upon: CHF History: 1 HTN History: 0 Diabetes History: 1 Stroke History: 0 Vascular Disease History: 0 Age Score: 0 Gender Score: 0       Physical Exam:  Today's Vitals   02/19/23 0808  BP: 122/78  Pulse: 62  SpO2: 98%  Weight: (!) 311 lb 12.8 oz (141.4 kg)  Height: 5\' 6"  (1.676 m)   Body mass index is 50.33 kg/m.   GEN: Well nourished, well developed in no acute distress NECK: No JVD CARDIAC: Normal rate and regular rhythm. RESPIRATORY:  Clear to auscultation without rales, wheezing or rhonchi  ABDOMEN: Soft, non-tender, non-distended EXTREMITIES:  No  edema; No deformity    ASSESSMENT AND PLAN:   Casey Velez is a 58 y.o. male with h/o chronic systolic heart failure secondary to NICM, presumptive tachy-arrhythmia induced who is being seen today for evaluation of atrial fibrillation at the request of Carlos Levering, NP.   #Persistent atrial fibrillation: Unfortunately, his atrial fibrillation is associated with likely tachy/arrhythmia induced cardiomyopathy.  For this reason, the rhythm control strategy would be best. Patient is looking into changing jobs which would necessitate new health insurance and limited time off. For this reason, there is uncertainty about hospitalization for Tikosyn or whether to undergo an ablation. His BMI is quite high but his body habitus/groin anatomy seems conducive for ablation.  - Discussed treatment options today for AF including antiarrhythmic drug therapy (ultimately do not want to keep on amiodarone and would necessitate switching to Tikosyn) and ablation. Discussed risks, recovery and likelihood of success with each treatment strategy. Risk, benefits, and alternatives to EP study and ablation for afib were discussed. These risks include but are not limited to stroke, bleeding, vascular damage, tamponade, perforation, damage to the esophagus, lungs, phrenic nerve and other structures, pulmonary vein stenosis, worsening renal function, coronary vasospasm and death.  Discussed potential need for repeat ablation procedures and antiarrhythmic drugs after an initial ablation. The patient understands these risks and would like some additional time to consider his options. For now, we will keep him on amiodarone as it is working and follow up in 3 months.    #Secondary hypercoagulable state due to atrial fibrillation:  -CHADSVASC score of 2. -Continue Eliquis.   #Chronic systolic heart failure: #NICM, presumptively tachy-arrhythmia induced:  -Appears well compensated today.  Continue follow up with general  cardiology.   Follow up with Dr. Jimmey Ralph in 3 months   Signed, Nobie Putnam, MD

## 2023-02-19 ENCOUNTER — Ambulatory Visit: Payer: PRIVATE HEALTH INSURANCE | Attending: Cardiology | Admitting: Cardiology

## 2023-02-19 ENCOUNTER — Encounter: Payer: Self-pay | Admitting: Cardiology

## 2023-02-19 ENCOUNTER — Telehealth: Payer: Self-pay | Admitting: Cardiology

## 2023-02-19 VITALS — BP 122/78 | HR 62 | Ht 66.0 in | Wt 311.8 lb

## 2023-02-19 DIAGNOSIS — I5022 Chronic systolic (congestive) heart failure: Secondary | ICD-10-CM | POA: Diagnosis not present

## 2023-02-19 DIAGNOSIS — I428 Other cardiomyopathies: Secondary | ICD-10-CM

## 2023-02-19 DIAGNOSIS — I480 Paroxysmal atrial fibrillation: Secondary | ICD-10-CM

## 2023-02-19 DIAGNOSIS — I4819 Other persistent atrial fibrillation: Secondary | ICD-10-CM | POA: Diagnosis not present

## 2023-02-19 DIAGNOSIS — D6869 Other thrombophilia: Secondary | ICD-10-CM | POA: Diagnosis not present

## 2023-02-19 NOTE — Patient Instructions (Signed)
 Medication Instructions:  Your physician recommends that you continue on your current medications as directed. Please refer to the Current Medication list given to you today.  *If you need a refill on your cardiac medications before your next appointment, please call your pharmacy*  Follow-Up: At [facility], you and your health needs are our priority.  As part of our continuing mission to provide you with exceptional heart care, we have created designated Provider Care Teams.  These Care Teams include your primary Cardiologist (physician) and Advanced Practice Providers (APPs -  Physician Assistants and Nurse Practitioners) who all work together to provide you with the care you need, when you need it.  Your next appointment:   3 months  Provider:   Fonda Kitty, MD    Other Instructions Call us  if you decide you would like to schedule an ablation

## 2023-02-19 NOTE — Telephone Encounter (Signed)
 Spoke with the patient and he would like to proceed with an ablation. I have scheduled him for 2/13. Labs and CT ordered.

## 2023-02-19 NOTE — Telephone Encounter (Signed)
 Patient called back to say that he does want to have the ablation done. Looking to see when it can be schedule. Please advise

## 2023-02-19 NOTE — Telephone Encounter (Signed)
 Spoke with the patient and gave him available dates for an ablation. He states that he is going to check with his work and give me a call back to schedule.

## 2023-02-19 NOTE — Telephone Encounter (Signed)
 Patient was returning phone call about ablation dates

## 2023-02-28 ENCOUNTER — Encounter (HOSPITAL_COMMUNITY): Payer: Self-pay

## 2023-03-01 ENCOUNTER — Telehealth (HOSPITAL_COMMUNITY): Payer: Self-pay | Admitting: Emergency Medicine

## 2023-03-01 NOTE — Telephone Encounter (Signed)
Attempted to call patient regarding upcoming cardiac CT appointment. Left message on voicemail with name and callback number Rockwell Alexandria RN Navigator Cardiac Imaging [facility] [phone] Office [phone] Cell

## 2023-03-01 NOTE — Telephone Encounter (Signed)
Reaching out to patient to offer assistance regarding upcoming cardiac imaging study; pt verbalizes understanding of appt date/time, parking situation and where to check in, pre-test NPO status and medications ordered, and verified current allergies; name and call back number provided for further questions should they arise Rockwell Alexandria RN Navigator Cardiac Imaging [facility] [phone] office [phone] cell

## 2023-03-04 ENCOUNTER — Other Ambulatory Visit
Admission: RE | Admit: 2023-03-04 | Discharge: 2023-03-04 | Disposition: A | Discharge status: Home / Self Care | Payer: PRIVATE HEALTH INSURANCE | Source: Home / Self Care | Attending: Cardiology | Admitting: Cardiology

## 2023-03-04 ENCOUNTER — Ambulatory Visit
Admission: RE | Admit: 2023-03-04 | Discharge: 2023-03-04 | Disposition: A | Discharge status: Home / Self Care | Payer: PRIVATE HEALTH INSURANCE | Source: Ambulatory Visit | Attending: Cardiology | Admitting: Cardiology

## 2023-03-04 DIAGNOSIS — I4819 Other persistent atrial fibrillation: Secondary | ICD-10-CM | POA: Insufficient documentation

## 2023-03-04 DIAGNOSIS — I480 Paroxysmal atrial fibrillation: Secondary | ICD-10-CM | POA: Insufficient documentation

## 2023-03-04 LAB — BASIC METABOLIC PANEL
Anion gap: 9 (ref 5–15)
BUN: 17 mg/dL (ref 6–20)
CO2: 22 mmol/L (ref 22–32)
Calcium: 8.7 mg/dL — ABNORMAL LOW (ref 8.9–10.3)
Chloride: 105 mmol/L (ref 98–111)
Creatinine, Ser: 1.1 mg/dL (ref 0.61–1.24)
GFR, Estimated: >60 mL/min (ref >60)
Glucose, Bld: 94 mg/dL (ref 70–99)
Potassium: 3.9 mmol/L (ref 3.5–5.1)
Sodium: 136 mmol/L (ref 135–145)

## 2023-03-04 LAB — CBC
HCT: 42 % (ref 39.0–52.0)
Hemoglobin: 14.2 g/dL (ref 13.0–17.0)
MCH: 30.4 pg (ref 26.0–34.0)
MCHC: 33.8 g/dL (ref 30.0–36.0)
MCV: 89.9 fL (ref 80.0–100.0)
Platelets: 234 10*3/uL (ref 150–400)
RBC: 4.67 MIL/uL (ref 4.22–5.81)
RDW: 15.8 % — ABNORMAL HIGH (ref 11.5–15.5)
WBC: 7.8 10*3/uL (ref 4.0–10.5)
nRBC: 0 % (ref 0.0–0.2)

## 2023-03-04 LAB — POCT I-STAT CREATININE: Creatinine, Ser: 1.2 mg/dL (ref 0.61–1.24)

## 2023-03-04 MED ORDER — IOHEXOL 350 MG/ML SOLN
125.0000 mL | Freq: Once | INTRAVENOUS | Status: AC | PRN
  Administered 2023-03-04: 125 mL via INTRAVENOUS

## 2023-03-04 MED ORDER — SODIUM CHLORIDE 0.9 % IV SOLN: INTRAVENOUS | Status: DC

## 2023-03-11 ENCOUNTER — Other Ambulatory Visit: Payer: Self-pay | Admitting: Nurse Practitioner

## 2023-03-12 ENCOUNTER — Telehealth: Payer: Self-pay | Admitting: Cardiology

## 2023-03-12 NOTE — Telephone Encounter (Signed)
Casey Velez with [facility] called in about CT. She has not reported anything critical, she asked to make provider aware of the Addendum report yesterday on CT.

## 2023-03-12 NOTE — Telephone Encounter (Signed)
 Requested Prescriptions  Pending Prescriptions Disp Refills   empagliflozin  (JARDIANCE ) 10 MG TABS tablet [Pharmacy Med Name: JARDIANCE  10MG  TABLETS] 90 tablet 0    Sig: TAKE 1 TABLET(10 MG) BY MOUTH DAILY     Endocrinology:  Diabetes - SGLT2 Inhibitors Passed - 03/12/2023 10:31 AM      Passed - Cr in normal range and within 360 days    Creatinine, Ser  Date Value Ref Range Status  03/04/2023 1.20 0.61 - 1.24 mg/dL Final         Passed - HBA1C is between 0 and 7.9 and within 180 days    HB A1C (BAYER DCA - WAIVED)  Date Value Ref Range Status  09/19/2022 6.4 (H) 4.8 - 5.6 % Final    Comment:             Prediabetes: 5.7 - 6.4          Diabetes: >6.4          Glycemic control for adults with diabetes: <7.0    Hgb A1c MFr Bld  Date Value Ref Range Status  11/19/2022 6.2 (H) 4.8 - 5.6 % Final    Comment:    (NOTE) Pre diabetes:          5.7%-6.4%  Diabetes:              >6.4%  Glycemic control for   <7.0% adults with diabetes          Passed - eGFR in normal range and within 360 days    GFR calc Af Amer  Date Value Ref Range Status  05/08/2017 >60 >60 mL/min Final    Comment:    (NOTE) The eGFR has been calculated using the CKD EPI equation. This calculation has not been validated in all clinical situations. eGFR's persistently <60 mL/min signify possible Chronic Kidney Disease.    GFR, Estimated  Date Value Ref Range Status  03/04/2023 >60 >60 mL/min Final    Comment:    (NOTE) Calculated using the CKD-EPI Creatinine Equation (2021)    eGFR  Date Value Ref Range Status  12/28/2022 87 >59 mL/min/1.73 Final         Passed - Valid encounter within last 6 months    Recent Outpatient Visits           2 months ago Atrial fibrillation with RVR (HCC)   [place] [facility] [place], Bostic T, NP   4 months ago Skin tag, acquired   [place] [facility] [place], [place] T, NP   5 months ago Type 2 diabetes mellitus with morbid  obesity (HCC)   [place] [place], [place] T, NP   11 months ago Type 2 diabetes mellitus with morbid obesity (HCC)   [place] [facility] [place], Jolene T, NP   1 year ago Diabetes mellitus without complication (HCC)   [place] [facility] Mecum, Rocky BRAVO, PA-C       Future Appointments             In 1 week Cannady, Jolene T, NP [place] [facility]   In 2 months Darron, Deatrice LABOR, MD [facility]

## 2023-03-17 DIAGNOSIS — E119 Type 2 diabetes mellitus without complications: Secondary | ICD-10-CM | POA: Insufficient documentation

## 2023-03-17 NOTE — Patient Instructions (Signed)

## 2023-03-20 NOTE — Anesthesia Preprocedure Evaluation (Addendum)
Anesthesia Evaluation  Patient identified by MRN, date of birth, ID band Patient awake    Reviewed: Allergy & Precautions, NPO status , Patient's Chart, lab work & pertinent test results  History of Anesthesia Complications Negative for: history of anesthetic complications  Airway Mallampati: IV  TM Distance: >3 FB Neck ROM: Full    Dental  (+) Dental Advisory Given, Teeth Intact   Pulmonary sleep apnea and Continuous Positive Airway Pressure Ventilation , neg recent URI   Pulmonary exam normal breath sounds clear to auscultation       Cardiovascular Exercise Tolerance: Good pulmonary hypertension+CHF and + DOE  + dysrhythmias Atrial Fibrillation + Valvular Problems/Murmurs (Mild-mod TR) MR  Rhythm:Regular Rate:Normal  Cath 01/2023   Prox LAD to Mid LAD lesion is 30% stenosed.   There is moderate to severe left ventricular systolic dysfunction.   LV end diastolic pressure is normal.   The left ventricular ejection fraction is 25-35% by visual estimate.   1.  Mild nonobstructive coronary artery disease. 2.  Moderately to severely reduced LV systolic function with an EF of 30%. 3.  Right heart catheterization showed normal filling pressures, normal pulmonary pressure and moderately to severely reduced cardiac index. 4.  Atrial fibrillation with RVR noted before starting the case.   TTE 11/19/2022  1. Left ventricular ejection fraction, by estimation, is 30 to 35%. The left ventricle has moderate to severely decreased function. The left ventricle demonstrates global hypokinesis. The left ventricular internal cavity size was moderately dilated. There is mild left ventricular hypertrophy. Left ventricular diastolic function could not be evaluated.   2. Right ventricular systolic function is moderately reduced. The right ventricular size is mildly enlarged. There is moderately elevated pulmonary artery systolic pressure.   3. Left  atrial size was mildly dilated.   4. A small pericardial effusion is present.   5. The mitral valve is normal in structure. Mild mitral valve regurgitation.   6. Tricuspid valve regurgitation is mild to moderate.   7. The aortic valve is tricuspid. Aortic valve regurgitation is not visualized. No aortic stenosis is present.   8. There is mild dilatation of the ascending aorta, measuring 42 mm.   9. The inferior vena cava is dilated in size with <50% respiratory variability, suggesting right atrial pressure of 15 mmHg.    TEE 11/26/2022  1. Left ventricular ejection fraction, by estimation, is 30%. The left ventricle has moderate to severely decreased function.   2. Right ventricular systolic function is moderately reduced. The right ventricular size is normal.   3. Left atrial size was moderately dilated. No left atrial/left atrial appendage thrombus was detected.   4. Right atrial size was mildly dilated.   5. The mitral valve is normal in structure. Mild mitral valve regurgitation.   6. The aortic valve is tricuspid. Aortic valve regurgitation is not visualized. Aortic valve sclerosis is present, with no evidence of aortic valve stenosis.     Neuro/Psych negative neurological ROS  negative psych ROS   GI/Hepatic Neg liver ROS,GERD  Controlled and Medicated,,  Endo/Other  diabetes, Type 2  Class 3 obesity  Renal/GU negative Renal ROS     Musculoskeletal  (+) Arthritis ,    Abdominal  (+) + obese  Peds  Hematology negative hematology ROS (+)   Anesthesia Other Findings   Reproductive/Obstetrics  Anesthesia Physical Anesthesia Plan  ASA: 4  Anesthesia Plan: General   Post-op Pain Management: Tylenol PO (pre-op)*   Induction: Intravenous  PONV Risk Score and Plan: 2 and Ondansetron, Dexamethasone and Treatment may vary due to age or medical condition  Airway Management Planned: Oral ETT and Video Laryngoscope  Planned  Additional Equipment: Arterial line  Intra-op Plan:   Post-operative Plan: Extubation in OR  Informed Consent: I have reviewed the patients History and Physical, chart, labs and discussed the procedure including the risks, benefits and alternatives for the proposed anesthesia with the patient or authorized representative who has indicated his/her understanding and acceptance.     Dental advisory given  Plan Discussed with: CRNA  Anesthesia Plan Comments:         Anesthesia Quick Evaluation

## 2023-03-20 NOTE — Pre-Procedure Instructions (Signed)
Attempted to call patient regarding procedure instructions.  Left voice mail on the following items: Arrival time 0515 Nothing to eat or drink after midnight No meds AM of procedure Responsible person to drive you home and stay with you for 24 hrs  Have you missed any doses of anti-coagulant Eliquis- should be taken twice a day, if you have missed any doses please let us know.  Don't take dose in the morning.

## 2023-03-21 ENCOUNTER — Ambulatory Visit (HOSPITAL_COMMUNITY)
Admission: RE | Admit: 2023-03-21 | Discharge: 2023-03-21 | Disposition: A | Discharge status: Home / Self Care | Payer: PRIVATE HEALTH INSURANCE | Attending: Cardiology | Admitting: Cardiology

## 2023-03-21 ENCOUNTER — Other Ambulatory Visit: Payer: Self-pay

## 2023-03-21 ENCOUNTER — Ambulatory Visit (HOSPITAL_BASED_OUTPATIENT_CLINIC_OR_DEPARTMENT_OTHER): Payer: PRIVATE HEALTH INSURANCE | Admitting: Anesthesiology

## 2023-03-21 ENCOUNTER — Ambulatory Visit (HOSPITAL_COMMUNITY): Payer: PRIVATE HEALTH INSURANCE | Admitting: Anesthesiology

## 2023-03-21 ENCOUNTER — Encounter (HOSPITAL_COMMUNITY): Payer: Self-pay | Admitting: Cardiology

## 2023-03-21 ENCOUNTER — Encounter (HOSPITAL_COMMUNITY)
Admission: RE | Disposition: A | Discharge status: Home / Self Care | Payer: PRIVATE HEALTH INSURANCE | Source: Home / Self Care | Attending: Cardiology

## 2023-03-21 DIAGNOSIS — G4733 Obstructive sleep apnea (adult) (pediatric): Secondary | ICD-10-CM | POA: Diagnosis not present

## 2023-03-21 DIAGNOSIS — I509 Heart failure, unspecified: Secondary | ICD-10-CM

## 2023-03-21 DIAGNOSIS — I5022 Chronic systolic (congestive) heart failure: Secondary | ICD-10-CM | POA: Diagnosis not present

## 2023-03-21 DIAGNOSIS — I110 Hypertensive heart disease with heart failure: Secondary | ICD-10-CM

## 2023-03-21 DIAGNOSIS — R0609 Other forms of dyspnea: Secondary | ICD-10-CM | POA: Insufficient documentation

## 2023-03-21 DIAGNOSIS — K219 Gastro-esophageal reflux disease without esophagitis: Secondary | ICD-10-CM | POA: Insufficient documentation

## 2023-03-21 DIAGNOSIS — E66813 Obesity, class 3: Secondary | ICD-10-CM | POA: Insufficient documentation

## 2023-03-21 DIAGNOSIS — Z6841 Body Mass Index (BMI) 40.0 and over, adult: Secondary | ICD-10-CM | POA: Insufficient documentation

## 2023-03-21 DIAGNOSIS — I4819 Other persistent atrial fibrillation: Secondary | ICD-10-CM | POA: Diagnosis present

## 2023-03-21 DIAGNOSIS — D6869 Other thrombophilia: Secondary | ICD-10-CM | POA: Diagnosis not present

## 2023-03-21 DIAGNOSIS — E119 Type 2 diabetes mellitus without complications: Secondary | ICD-10-CM | POA: Insufficient documentation

## 2023-03-21 DIAGNOSIS — I272 Pulmonary hypertension, unspecified: Secondary | ICD-10-CM | POA: Diagnosis not present

## 2023-03-21 DIAGNOSIS — Z7901 Long term (current) use of anticoagulants: Secondary | ICD-10-CM | POA: Diagnosis not present

## 2023-03-21 DIAGNOSIS — I428 Other cardiomyopathies: Secondary | ICD-10-CM | POA: Diagnosis not present

## 2023-03-21 HISTORY — PX: ATRIAL FIBRILLATION ABLATION: EP1191

## 2023-03-21 LAB — GLUCOSE, CAPILLARY
Glucose-Capillary: 108 mg/dL — ABNORMAL HIGH (ref 70–99)
Glucose-Capillary: 97 mg/dL (ref 70–99)

## 2023-03-21 LAB — POCT ACTIVATED CLOTTING TIME
Activated Clotting Time: 279 s
Activated Clotting Time: 279 s

## 2023-03-21 SURGERY — ATRIAL FIBRILLATION ABLATION
Anesthesia: General

## 2023-03-21 MED ORDER — LIDOCAINE 2% (20 MG/ML) 5 ML SYRINGE
INTRAMUSCULAR | Status: DC | PRN
  Administered 2023-03-21: 100 mg via INTRAVENOUS

## 2023-03-21 MED ORDER — SODIUM CHLORIDE 0.9% FLUSH: 3.0000 mL | Freq: Two times a day (BID) | INTRAVENOUS | Status: DC

## 2023-03-21 MED ORDER — APIXABAN 5 MG PO TABS
5.0000 mg | ORAL_TABLET | Freq: Once | ORAL | Status: AC
  Administered 2023-03-21: 5 mg via ORAL
  Filled 2023-03-21: qty 1

## 2023-03-21 MED ORDER — ROCURONIUM BROMIDE 10 MG/ML (PF) SYRINGE
PREFILLED_SYRINGE | INTRAVENOUS | Status: DC | PRN
  Administered 2023-03-21: 100 mg via INTRAVENOUS

## 2023-03-21 MED ORDER — FENTANYL CITRATE (PF) 100 MCG/2ML IJ SOLN
INTRAMUSCULAR | Status: AC
  Filled 2023-03-21: qty 2

## 2023-03-21 MED ORDER — DEXAMETHASONE SODIUM PHOSPHATE 10 MG/ML IJ SOLN
INTRAMUSCULAR | Status: DC | PRN
  Administered 2023-03-21: 5 mg via INTRAVENOUS

## 2023-03-21 MED ORDER — PROTAMINE SULFATE 10 MG/ML IV SOLN
INTRAVENOUS | Status: DC | PRN
  Administered 2023-03-21: 35 mg via INTRAVENOUS

## 2023-03-21 MED ORDER — FENTANYL CITRATE (PF) 250 MCG/5ML IJ SOLN
INTRAMUSCULAR | Status: DC | PRN
  Administered 2023-03-21: 100 ug via INTRAVENOUS

## 2023-03-21 MED ORDER — PHENYLEPHRINE 80 MCG/ML (10ML) SYRINGE FOR IV PUSH (FOR BLOOD PRESSURE SUPPORT)
PREFILLED_SYRINGE | INTRAVENOUS | Status: DC | PRN
  Administered 2023-03-21 (×3): 80 ug via INTRAVENOUS

## 2023-03-21 MED ORDER — SUGAMMADEX SODIUM 200 MG/2ML IV SOLN
INTRAVENOUS | Status: DC | PRN
  Administered 2023-03-21: 400 mg via INTRAVENOUS

## 2023-03-21 MED ORDER — ONDANSETRON HCL 4 MG/2ML IJ SOLN
INTRAMUSCULAR | Status: DC | PRN
  Administered 2023-03-21: 4 mg via INTRAVENOUS

## 2023-03-21 MED ORDER — SODIUM CHLORIDE 0.9 % IV SOLN
INTRAVENOUS | Status: DC
Start: 2023-03-21 — End: 2023-03-21

## 2023-03-21 MED ORDER — SODIUM CHLORIDE 0.9 % IV SOLN: 250.0000 mL | INTRAVENOUS | Status: DC | PRN

## 2023-03-21 MED ORDER — HEPARIN SODIUM (PORCINE) 1000 UNIT/ML IJ SOLN
INTRAMUSCULAR | Status: DC | PRN
Start: 2023-03-21 — End: 2023-03-21
  Administered 2023-03-21: 5000 [IU] via INTRAVENOUS
  Administered 2023-03-21: 3000 [IU] via INTRAVENOUS
  Administered 2023-03-21: 21000 [IU] via INTRAVENOUS

## 2023-03-21 MED ORDER — ATROPINE SULFATE 1 MG/10ML IJ SOSY
PREFILLED_SYRINGE | INTRAMUSCULAR | Status: AC
  Filled 2023-03-21: qty 10

## 2023-03-21 MED ORDER — ACETAMINOPHEN 325 MG PO TABS: 650.0000 mg | ORAL_TABLET | ORAL | Status: DC | PRN

## 2023-03-21 MED ORDER — AMIODARONE HCL 200 MG PO TABS: 200.0000 mg | ORAL_TABLET | Freq: Every day | ORAL | 0 refills | Status: DC

## 2023-03-21 MED ORDER — PROPOFOL 10 MG/ML IV BOLUS
INTRAVENOUS | Status: DC | PRN
  Administered 2023-03-21: 150 mg via INTRAVENOUS

## 2023-03-21 MED ORDER — HEPARIN (PORCINE) IN NACL 1000-0.9 UT/500ML-% IV SOLN
INTRAVENOUS | Status: DC | PRN
  Administered 2023-03-21 (×3): 500 mL

## 2023-03-21 MED ORDER — ONDANSETRON HCL 4 MG/2ML IJ SOLN: 4.0000 mg | Freq: Four times a day (QID) | INTRAMUSCULAR | Status: DC | PRN

## 2023-03-21 MED ORDER — ATROPINE SULFATE 0.4 MG/ML IV SOLN
INTRAVENOUS | Status: DC | PRN
  Administered 2023-03-21: 1 mg via INTRAVENOUS

## 2023-03-21 MED ORDER — SODIUM CHLORIDE 0.9% FLUSH: 3.0000 mL | INTRAVENOUS | Status: DC | PRN

## 2023-03-21 SURGICAL SUPPLY — 21 items
BAG SNAP BAND KOVER 36X36 (MISCELLANEOUS) ×1
BLANKET WARM UNDERBOD FULL ACC (MISCELLANEOUS) ×1
CABLE PFA RX CATH CONN (CABLE) ×1
CATH 8FR REPROCESSED SOUNDSTAR (CATHETERS) ×1 IMPLANT
CATH FARAWAVE ABLATION 31 (CATHETERS) ×1
CATH OCTARAY 2.0 F 3-3-3-3-3 (CATHETERS) ×1
CATH WEBSTER BI DIR CS D-F CRV (CATHETERS) ×1
CLOSURE PERCLOSE PROSTYLE (VASCULAR PRODUCTS) ×2
COVER SWIFTLINK CONNECTOR (BAG) ×1
DEVICE CLOSURE MYNXGRIP 6/7F (Vascular Products) ×2 IMPLANT
DILATOR VESSEL 38 20CM 16FR (INTRODUCER) ×1
INQWIRE 1.5J .035X260CM (WIRE) ×1 IMPLANT
KIT VERSACROSS CNCT FARADRIVE (KITS) ×1
MAT PREVALON FULL STRYKER (MISCELLANEOUS) ×1
PACK EP LF (CUSTOM PROCEDURE TRAY) ×1
PAD DEFIB RADIO PHYSIO CONN (PAD) ×1
PATCH CARTO3 (PAD) ×1
SHEATH FARADRIVE STEERABLE (SHEATH) ×1
SHEATH PINNACLE 8F 10CM (SHEATH) ×2
SHEATH PINNACLE 9F 10CM (SHEATH) ×1
SHEATH PROBE COVER 6X72 (BAG) ×1

## 2023-03-21 NOTE — Anesthesia Procedure Notes (Signed)
Procedure Name: Intubation Date/Time: 03/21/2023 8:09 AM  Performed by: April Holding, CRNAPre-anesthesia Checklist: Patient identified, Emergency Drugs available, Suction available and Patient being monitored Patient Re-evaluated:Patient Re-evaluated prior to induction Oxygen Delivery Method: Circle System Utilized Preoxygenation: Pre-oxygenation with 100% oxygen Induction Type: IV induction Ventilation: Mask ventilation without difficulty Laryngoscope Size: Glidescope and 4 Grade View: Grade III Tube type: Oral Tube size: 7.5 mm Number of attempts: 1 Airway Equipment and Method: Stylet and Oral airway Placement Confirmation: ETT inserted through vocal cords under direct vision, positive ETCO2 and breath sounds checked- equal and bilateral Secured at: 24 cm Tube secured with: Tape Dental Injury: Teeth and Oropharynx as per pre-operative assessment

## 2023-03-21 NOTE — Anesthesia Procedure Notes (Signed)
Arterial Line Insertion Start/End2/13/2025 7:49 AM, 03/21/2023 7:59 AM Performed by: Lewie Loron, MD, April Holding, CRNA, CRNA  Patient location: OR. Preanesthetic checklist: patient identified, IV checked, site marked, risks and benefits discussed, surgical consent, monitors and equipment checked, pre-op evaluation, timeout performed and anesthesia consent Lidocaine 1% used for infiltration radial was placed Catheter size: 20 G Hand hygiene performed  and maximum sterile barriers used  Allen's test indicative of satisfactory collateral circulation Attempts: 1 Procedure performed without using ultrasound guided technique. Following insertion, dressing applied and Biopatch. Post procedure assessment: normal  Patient tolerated the procedure well with no immediate complications.

## 2023-03-21 NOTE — Interval H&P Note (Signed)
History and Physical Interval Note:  03/21/2023 7:22 AM  [name]  has presented today for surgery, with the diagnosis of symptomatic persistent atrial fibrillation.  The various methods of treatment have been discussed with the patient and family. After consideration of risks, benefits and other options for treatment, the patient has consented to  Procedure(s): ATRIAL FIBRILLATION ABLATION (N/A) as a surgical intervention.  The patient's history has been reviewed, patient examined, no change in status, stable for surgery.  I have reviewed the patient's chart and labs.  Questions were answered to the patient's satisfaction.     Casey Velez

## 2023-03-21 NOTE — Anesthesia Postprocedure Evaluation (Signed)
Anesthesia Post Note  [name]  Procedure(s) Performed: ATRIAL FIBRILLATION ABLATION     Patient location during evaluation: PACU Anesthesia Type: General Level of consciousness: sedated and patient cooperative Pain management: pain level controlled Vital Signs Assessment: post-procedure vital signs reviewed and stable Respiratory status: spontaneous breathing Cardiovascular status: stable Anesthetic complications: no   No notable events documented.  Last Vitals:  Vitals:   03/21/23 1055 03/21/23 1100  BP: 107/69 106/66  Pulse: (!) 54 (!) 51  Resp: 15 11  Temp:    SpO2: 97% 95%    Last Pain:  Vitals:   03/21/23 1058  TempSrc:   PainSc: 0-No pain                 Lewie Loron

## 2023-03-21 NOTE — Progress Notes (Signed)
Patient walked to the bathroom without difficulties. Bilateral groins level 0, clean, dry, and intact.

## 2023-03-21 NOTE — Discharge Instructions (Addendum)
Decrease amiodarone to 200mg  once daily. Stop in 1 month.  Cardiac Ablation, Care After  This sheet gives you information about how to care for yourself after your procedure. Your health care provider may also give you more specific instructions. If you have problems or questions, contact your health care provider. What can I expect after the procedure? After the procedure, it is common to have: Bruising around your puncture site. Tenderness around your puncture site. Skipped heartbeats. If you had an atrial fibrillation ablation, you may have atrial fibrillation during the first several months after your procedure.  Tiredness (fatigue).  Follow these instructions at home: Puncture site care  Follow instructions from your health care provider about how to take care of your puncture site. Make sure you: If present, leave stitches (sutures), skin glue, or adhesive strips in place. These skin closures may need to stay in place for up to 2 weeks. If adhesive strip edges start to loosen and curl up, you may trim the loose edges. Do not remove adhesive strips completely unless your health care provider tells you to do that. If a large square bandage is present, this may be removed 24 hours after surgery.  Check your puncture site every day for signs of infection. Check for: Redness, swelling, or pain. Fluid or blood. If your puncture site starts to bleed, lie down on your back, apply firm pressure to the area, and contact your health care provider. Warmth. Pus or a bad smell. A pea or marble sized lump/knot at the site is normal and can take up to three months to resolve.  Driving Do not drive for at least 4 days after your procedure or however long your health care provider recommends. (Do not resume driving if you have previously been instructed not to drive for other health reasons.) Do not drive or use heavy machinery while taking prescription pain medicine. Activity Avoid activities that  take a lot of effort for at least 7 days after your procedure. Do not lift anything that is heavier than 5 lb (4.5 kg) for one week.  No sexual activity for 1 week.  Return to your normal activities as told by your health care provider. Ask your health care provider what activities are safe for you. General instructions Take over-the-counter and prescription medicines only as told by your health care provider. Do not use any products that contain nicotine or tobacco, such as cigarettes and e-cigarettes. If you need help quitting, ask your health care provider. You may shower after 24 hours, but Do not take baths, swim, or use a hot tub for 1 week.  Do not drink alcohol for 24 hours after your procedure. Keep all follow-up visits as told by your health care provider. This is important. Contact a health care provider if: You have redness, mild swelling, or pain around your puncture site. You have fluid or blood coming from your puncture site that stops after applying firm pressure to the area. Your puncture site feels warm to the touch. You have pus or a bad smell coming from your puncture site. You have a fever. You have chest pain or discomfort that spreads to your neck, jaw, or arm. You have chest pain that is worse with lying on your back or taking a deep breath. You are sweating a lot. You feel nauseous. You have a fast or irregular heartbeat. You have shortness of breath. You are dizzy or light-headed and feel the need to lie down. You have pain or  numbness in the arm or leg closest to your puncture site. Get help right away if: Your puncture site suddenly swells. Your puncture site is bleeding and the bleeding does not stop after applying firm pressure to the area. These symptoms may represent a serious problem that is an emergency. Do not wait to see if the symptoms will go away. Get medical help right away. Call your local emergency services (911 in the [place] Do not drive yourself  to the hospital. Summary After the procedure, it is normal to have bruising and tenderness at the puncture site in your groin, neck, or forearm. Check your puncture site every day for signs of infection. Get help right away if your puncture site is bleeding and the bleeding does not stop after applying firm pressure to the area. This is a medical emergency. This information is not intended to replace advice given to you by your health care provider. Make sure you discuss any questions you have with your health care provider.

## 2023-03-21 NOTE — Transfer of Care (Signed)
Immediate Anesthesia Transfer of Care Note  Patient: ARISTEO HANKERSON  Procedure(s) Performed: ATRIAL FIBRILLATION ABLATION  Patient Location: Cath Lab  Anesthesia Type:General  Level of Consciousness: awake, alert , and oriented  Airway & Oxygen Therapy: Patient Spontanous Breathing and Patient connected to face mask oxygen  Post-op Assessment: Report given to RN and Post -op Vital signs reviewed and stable  Post vital signs: Reviewed and stable  Last Vitals:  Vitals Value Taken Time  BP    Temp    Pulse 56 03/21/23 1003  Resp 15 03/21/23 1003  SpO2 100 % 03/21/23 1003  Vitals shown include unfiled device data.  Last Pain:  Vitals:   03/21/23 0554  TempSrc:   PainSc: 0-No pain         Complications: No notable events documented.

## 2023-03-22 ENCOUNTER — Telehealth (HOSPITAL_COMMUNITY): Payer: Self-pay

## 2023-03-22 ENCOUNTER — Ambulatory Visit: Payer: PRIVATE HEALTH INSURANCE | Admitting: Nurse Practitioner

## 2023-03-22 ENCOUNTER — Encounter: Payer: Self-pay | Admitting: Nurse Practitioner

## 2023-03-22 DIAGNOSIS — I5022 Chronic systolic (congestive) heart failure: Secondary | ICD-10-CM | POA: Diagnosis not present

## 2023-03-22 DIAGNOSIS — I4891 Unspecified atrial fibrillation: Secondary | ICD-10-CM

## 2023-03-22 DIAGNOSIS — G4733 Obstructive sleep apnea (adult) (pediatric): Secondary | ICD-10-CM

## 2023-03-22 DIAGNOSIS — E1169 Type 2 diabetes mellitus with other specified complication: Secondary | ICD-10-CM

## 2023-03-22 DIAGNOSIS — Z6841 Body Mass Index (BMI) 40.0 and over, adult: Secondary | ICD-10-CM

## 2023-03-22 DIAGNOSIS — E785 Hyperlipidemia, unspecified: Secondary | ICD-10-CM

## 2023-03-22 DIAGNOSIS — Z7984 Long term (current) use of oral hypoglycemic drugs: Secondary | ICD-10-CM

## 2023-03-22 DIAGNOSIS — E119 Type 2 diabetes mellitus without complications: Secondary | ICD-10-CM | POA: Diagnosis not present

## 2023-03-22 DIAGNOSIS — N400 Enlarged prostate without lower urinary tract symptoms: Secondary | ICD-10-CM

## 2023-03-22 DIAGNOSIS — I420 Dilated cardiomyopathy: Secondary | ICD-10-CM

## 2023-03-22 DIAGNOSIS — E559 Vitamin D deficiency, unspecified: Secondary | ICD-10-CM

## 2023-03-22 LAB — MICROALBUMIN, URINE WAIVED
Creatinine, Urine Waived: 200 mg/dL (ref 10–300)
Microalb, Ur Waived: 30 mg/L — ABNORMAL HIGH (ref 0–19)
Microalb/Creat Ratio: <30 mg/g (ref <30)

## 2023-03-22 LAB — BAYER DCA HB A1C WAIVED: HB A1C (BAYER DCA - WAIVED): 5.9 % — ABNORMAL HIGH (ref 4.8–5.6)

## 2023-03-22 MED ORDER — ATORVASTATIN CALCIUM 80 MG PO TABS: 80.0000 mg | ORAL_TABLET | Freq: Every day | ORAL | 3 refills | Status: DC

## 2023-03-22 MED ORDER — OZEMPIC (0.25 OR 0.5 MG/DOSE) 2 MG/3ML ~~LOC~~ SOPN: PEN_INJECTOR | SUBCUTANEOUS | 4 refills | Status: DC

## 2023-03-22 NOTE — Assessment & Plan Note (Signed)
Chronic, ongoing.  Continue current medication regimen and adjust as needed. Lipid panel today.

## 2023-03-22 NOTE — Assessment & Plan Note (Signed)
Chronic, stable.  Uses CPAP nightly.  Praised for this.

## 2023-03-22 NOTE — Assessment & Plan Note (Signed)
Chronic, ongoing.  Will recheck level today and restart supplement as needed.

## 2023-03-22 NOTE — Assessment & Plan Note (Addendum)
Ongoing at present.  Will continue collaboration with cardiology and current regimen as prescribed by them.  Recommend: - Reminded to call for an overnight weight gain of >2 pounds or a weekly weight gain of >5 pounds - not adding salt to food and read food labels. Reviewed the importance of keeping daily sodium intake to 2000mg  daily  - Avoid Ibuprofen products

## 2023-03-22 NOTE — Assessment & Plan Note (Signed)
Refer to diabetes with obesity plan of care.

## 2023-03-22 NOTE — Assessment & Plan Note (Signed)
Chronic, stable with A1c 5.9% today.  Urine ALB 30 February 2025.  Continue Jardiance only at this time, which is cost effective for him.  Although has new insurance, we will try to get Ozempic covered which may assist with weight loss.  Continue to check BS every other day and document for visits.  Focus on diabetic diet.   - Eye exam up to date - Foot exam up to date - PCV20 needed, discussed with patient - No ACE or ARB -- will consider in future, but currently BP on lower side at baseline.  Educated him on this. Jardiance does offer kidney protection. - Statin on board.

## 2023-03-22 NOTE — Telephone Encounter (Signed)
Attempted to reach patient to follow up with procedure completed on 03/21/23, no answer. Left VM for patient to return call.

## 2023-03-22 NOTE — Assessment & Plan Note (Signed)
Diagnosed 11/19/22.  Regular rate today.  At this time symptoms much improved.  Continue current medication regimen and collaboration with cardiology.  Check TSH, is on Amiodarone for 28 days post ablation performed 03/21/23.  Educated him on this.

## 2023-03-22 NOTE — Assessment & Plan Note (Signed)
BMI 51.26 with T2DM, OSA, A-FIB.  Recommended eating smaller high protein, low fat meals more frequently and exercising 30 mins a day 5 times a week with a goal of 10-15lb weight loss in the next 3 months. Patient voiced their understanding and motivation to adhere to these recommendations.

## 2023-03-22 NOTE — Assessment & Plan Note (Signed)
Noted in hospital.  Continue to collaborate with cardiology and continue current medication regimen as ordered by them.

## 2023-03-22 NOTE — Telephone Encounter (Signed)
Spoke with patient to complete post procedure follow up call.  Patient reports no complications with groin sites.   Instructions reviewed with patient:  Remove large bandage at puncture site after 24 hours. It is normal to have bruising, tenderness and a pea or marble sized lump/knot at the groin site which can take up to three months to resolve.  Get help right away if you notice sudden swelling at the puncture site.  Check your puncture site every day for signs of infection: fever, redness, swelling, pus drainage, warmth, foul odor or excessive pain. If this occurs, please call the office at [phone], to speak with the nurse. Get help right away if your puncture site is bleeding and the bleeding does not stop after applying firm pressure to the area.  You may continue to have skipped beats/ atrial fibrillation during the first several months after your procedure.  It is very important not to miss any doses of your blood thinner Eliquis. Patient restarted taking this medication on yesterday, 03/21/23.   You will follow up with the APP at on 04/23/23 and 06/18/23, both at [place] office.    Patient verbalized understanding to all instructions provided.

## 2023-03-22 NOTE — Progress Notes (Signed)
BP 109/61   Pulse 75   Temp 98.4 F (36.9 C) (Oral)   Ht 5\' 6"  (1.676 m)   Wt (!) 317 lb 9.6 oz (144.1 kg)   SpO2 98%   BMI 51.26 kg/m    Subjective:    Patient ID: Casey Velez, male    DOB: Apr 16, 1965, 58 y.o.   MRN: [id number]  HPI: Casey Velez is a 58 y.o. male  Chief Complaint  Patient presents with   Diabetes   Hyperlipidemia   Hypertension   DIABETES October A1c 6.2%. Takes Jardiance 10 MG daily. Tried to get a GLP1 in past but insurance would not cover, but has new insurance. Hypoglycemic episodes:no Polydipsia/polyuria: no Visual disturbance: no Chest pain: no Paresthesias: no Glucose Monitoring: yes  Accucheck frequency:  every other day  Fasting glucose: 132 this morning.  Average 107 to 110.  Post prandial:  Evening:  Before meals: Taking Insulin?: no  Long acting insulin:  Short acting insulin: Blood Pressure Monitoring: occasionally Retinal Examination: Up to Date Foot Exam: Up to Date Pneumovax: Not up to Date - educated him on this today Influenza: Up to Date Aspirin: yes   HYPERTENSION / HYPERLIPIDEMIA/HF Taking Amiodarone, Eliquis, Metoprolol, and Atorvastatin.  Has been out of Atorvastatin for months as pharmacy did not fill.  Last EF via echo TEE was 30% and left ventricle has moderate to severely decreased function.  Continues to use CPAP nightly. Satisfied with current treatment? yes Duration of hypertension: chronic BP monitoring frequency: daily BP range: 120/70-80 BP medication side effects: no Duration of hyperlipidemia: chronic Cholesterol medication side effects: yes Cholesterol supplements: none Medication compliance: good compliance Aspirin: yes Recent stressors: no Recurrent headaches: no Visual changes: no Palpitations: no Dyspnea: no Chest pain: no Lower extremity edema: no Dizzy/lightheaded: no  The 10-year ASCVD risk score (Arnett DK, et al., 2019) is: 7.5%   Values used to calculate the score:     Age:  61 years     Sex: Male     Is Non-Hispanic African American: No     Diabetic: Yes     Tobacco smoker: No     Systolic Blood Pressure: 109 mmHg     Is BP treated: No     HDL Cholesterol: 37 mg/dL     Total Cholesterol: 137 mg/dL  ATRIAL FIBRILLATION Had ablation yesterday. Atrial fibrillation status: stable Satisfied with current treatment: yes  Medication side effects:  no Medication compliance: good compliance Etiology of atrial fibrillation: unknown Palpitations:  no Chest pain:  no Dyspnea on exertion:  no Orthopnea:  no Syncope:  no Edema:  no Ventricular rate control: B-blocker Anti-coagulation: long acting   Relevant past medical, surgical, family and social history reviewed and updated as indicated. Interim medical history since our last visit reviewed. Allergies and medications reviewed and updated.  Review of Systems  Constitutional:  Negative for activity change, diaphoresis, fatigue and fever.  Respiratory:  Negative for cough, chest tightness, shortness of breath and wheezing.   Cardiovascular:  Negative for chest pain, palpitations and leg swelling.  Gastrointestinal: Negative.   Endocrine: Negative for cold intolerance, heat intolerance, polydipsia, polyphagia and polyuria.  Neurological: Negative.   Psychiatric/Behavioral: Negative.     Per HPI unless specifically indicated above     Objective:    BP 109/61   Pulse 75   Temp 98.4 F (36.9 C) (Oral)   Ht 5\' 6"  (1.676 m)   Wt (!) 317 lb 9.6 oz (144.1 kg)  SpO2 98%   BMI 51.26 kg/m   Wt Readings from Last 3 Encounters:  03/22/23 (!) 317 lb 9.6 oz (144.1 kg)  03/21/23 (!) 310 lb (140.6 kg)  02/19/23 (!) 311 lb 12.8 oz (141.4 kg)    Physical Exam Vitals and nursing note reviewed.  Constitutional:      General: He is awake. He is not in acute distress.    Appearance: He is well-developed and well-groomed. He is obese. He is not ill-appearing or toxic-appearing.  HENT:     Head: Normocephalic.   Eyes:     General: Lids are normal.     Extraocular Movements: Extraocular movements intact.     Conjunctiva/sclera: Conjunctivae normal.  Neck:     Thyroid: No thyromegaly.     Vascular: No carotid bruit.  Cardiovascular:     Rate and Rhythm: Normal rate and regular rhythm.     Heart sounds: Normal heart sounds.  Pulmonary:     Effort: No accessory muscle usage or respiratory distress.     Breath sounds: Normal breath sounds.  Abdominal:     General: Bowel sounds are normal. There is no distension.     Palpations: Abdomen is soft.     Tenderness: There is no abdominal tenderness.  Musculoskeletal:     Cervical back: Full passive range of motion without pain.     Right lower leg: No edema.     Left lower leg: No edema.  Lymphadenopathy:     Cervical: No cervical adenopathy.  Skin:    General: Skin is warm.     Capillary Refill: Capillary refill takes less than 2 seconds.  Neurological:     Mental Status: He is alert and oriented to person, place, and time.     Deep Tendon Reflexes: Reflexes are normal and symmetric.     Reflex Scores:      Brachioradialis reflexes are 2+ on the right side and 2+ on the left side.      Patellar reflexes are 2+ on the right side and 2+ on the left side. Psychiatric:        Attention and Perception: Attention normal.        Mood and Affect: Mood normal.        Speech: Speech normal.        Behavior: Behavior normal. Behavior is cooperative.        Thought Content: Thought content normal.    Results for orders placed or performed during the hospital encounter of 03/21/23  Glucose, capillary   Collection Time: 03/21/23  5:34 AM  Result Value Ref Range   Glucose-Capillary 108 (H) 70 - 99 mg/dL   Comment 1 Notify RN    Comment 2 Document in Chart   POCT Activated clotting time   Collection Time: 03/21/23  9:04 AM  Result Value Ref Range   Activated Clotting Time 279 seconds  POCT Activated clotting time   Collection Time: 03/21/23  9:22  AM  Result Value Ref Range   Activated Clotting Time 279 seconds  Glucose, capillary   Collection Time: 03/21/23 10:26 AM  Result Value Ref Range   Glucose-Capillary 97 70 - 99 mg/dL      Assessment & Plan:   Problem List Items Addressed This Visit       Cardiovascular and Mediastinum   Atrial fibrillation with RVR (HCC)   Diagnosed 11/19/22.  Regular rate today.  At this time symptoms much improved.  Continue current medication regimen and collaboration with cardiology.  Check TSH, is on Amiodarone for 28 days post ablation performed 03/21/23.  Educated him on this.      Relevant Medications   atorvastatin (LIPITOR) 80 MG tablet   Other Relevant Orders   TSH   Cardiomyopathy (HCC)   Noted in hospital.  Continue to collaborate with cardiology and continue current medication regimen as ordered by them.      Relevant Medications   atorvastatin (LIPITOR) 80 MG tablet   Chronic HFrEF (heart failure with reduced ejection fraction) (HCC)   Ongoing at present.  Will continue collaboration with cardiology and current regimen as prescribed by them.  Recommend: - Reminded to call for an overnight weight gain of >2 pounds or a weekly weight gain of >5 pounds - not adding salt to food and read food labels. Reviewed the importance of keeping daily sodium intake to 2000mg  daily  - Avoid Ibuprofen products      Relevant Medications   atorvastatin (LIPITOR) 80 MG tablet     Respiratory   OSA on CPAP   Chronic, stable.  Uses CPAP nightly.  Praised for this.        Endocrine   Diabetes mellitus treated with oral medication (HCC)   Refer to diabetes with obesity plan of care.      Relevant Medications   Semaglutide,0.25 or 0.5MG /DOS, (OZEMPIC, 0.25 OR 0.5 MG/DOSE,) 2 MG/3ML SOPN   atorvastatin (LIPITOR) 80 MG tablet   Hyperlipidemia associated with type 2 diabetes mellitus (HCC)   Chronic, ongoing.  Continue current medication regimen and adjust as needed.  Lipid panel today.       Relevant Medications   Semaglutide,0.25 or 0.5MG /DOS, (OZEMPIC, 0.25 OR 0.5 MG/DOSE,) 2 MG/3ML SOPN   atorvastatin (LIPITOR) 80 MG tablet   Other Relevant Orders   Bayer DCA Hb A1c Waived   Microalbumin, Urine Waived   Comprehensive metabolic panel   Lipid Panel w/o Chol/HDL Ratio   Type 2 diabetes mellitus with morbid obesity (HCC) - Primary   Chronic, stable with A1c 5.9% today.  Urine ALB 30 February 2025.  Continue Jardiance only at this time, which is cost effective for him.  Although has new insurance, we will try to get Ozempic covered which may assist with weight loss.  Continue to check BS every other day and document for visits.  Focus on diabetic diet.   - Eye exam up to date - Foot exam up to date - PCV20 needed, discussed with patient - No ACE or ARB -- will consider in future, but currently BP on lower side at baseline.  Educated him on this. Jardiance does offer kidney protection. - Statin on board.      Relevant Medications   Semaglutide,0.25 or 0.5MG /DOS, (OZEMPIC, 0.25 OR 0.5 MG/DOSE,) 2 MG/3ML SOPN   atorvastatin (LIPITOR) 80 MG tablet   Other Relevant Orders   Bayer DCA Hb A1c Waived   Microalbumin, Urine Waived     Other   Body mass index (BMI) of 50-59.9 in adult [facility])   Refer to morbid obesity plan of care.      Relevant Medications   Semaglutide,0.25 or 0.5MG /DOS, (OZEMPIC, 0.25 OR 0.5 MG/DOSE,) 2 MG/3ML SOPN   Morbid obesity (HCC)   BMI 51.26 with T2DM, OSA, A-FIB.  Recommended eating smaller high protein, low fat meals more frequently and exercising 30 mins a day 5 times a week with a goal of 10-15lb weight loss in the next 3 months. Patient voiced their understanding and motivation to adhere to these recommendations.  Relevant Medications   Semaglutide,0.25 or 0.5MG /DOS, (OZEMPIC, 0.25 OR 0.5 MG/DOSE,) 2 MG/3ML SOPN   Vitamin D deficiency   Chronic, ongoing.  Will recheck level today and restart supplement as needed.      Relevant Orders    VITAMIN D 25 Hydroxy (Vit-D Deficiency, Fractures)   Other Visit Diagnoses       Benign prostatic hyperplasia without lower urinary tract symptoms       PSA on labs today.   Relevant Orders   PSA        Follow up plan: Return in about 3 months (around 06/19/2023) for T2DM, HTN/HLD, A-FIB.

## 2023-03-22 NOTE — Assessment & Plan Note (Signed)
Refer to morbid obesity plan of care.

## 2023-03-23 ENCOUNTER — Encounter: Payer: Self-pay | Admitting: Nurse Practitioner

## 2023-03-23 LAB — LIPID PANEL W/O CHOL/HDL RATIO
Cholesterol, Total: 222 mg/dL — ABNORMAL HIGH (ref 100–199)
HDL: 35 mg/dL — ABNORMAL LOW (ref >39)
LDL Chol Calc (NIH): 123 mg/dL — ABNORMAL HIGH (ref 0–99)
Triglycerides: 363 mg/dL — ABNORMAL HIGH (ref 0–149)
VLDL Cholesterol Cal: 64 mg/dL — ABNORMAL HIGH (ref 5–40)

## 2023-03-23 LAB — COMPREHENSIVE METABOLIC PANEL
ALT: 34 [IU]/L (ref 0–44)
AST: 79 [IU]/L — ABNORMAL HIGH (ref 0–40)
Albumin: 4.2 g/dL (ref 3.8–4.9)
Alkaline Phosphatase: 94 [IU]/L (ref 44–121)
BUN/Creatinine Ratio: 20 (ref 9–20)
BUN: 17 mg/dL (ref 6–24)
Bilirubin Total: 0.4 mg/dL (ref 0.0–1.2)
CO2: 20 mmol/L (ref 20–29)
Calcium: 8.7 mg/dL (ref 8.7–10.2)
Chloride: 103 mmol/L (ref 96–106)
Creatinine, Ser: 0.86 mg/dL (ref 0.76–1.27)
Globulin, Total: 2.4 g/dL (ref 1.5–4.5)
Glucose: 117 mg/dL — ABNORMAL HIGH (ref 70–99)
Potassium: 4 mmol/L (ref 3.5–5.2)
Sodium: 137 mmol/L (ref 134–144)
Total Protein: 6.6 g/dL (ref 6.0–8.5)
eGFR: 102 mL/min/{1.73_m2} (ref >59)

## 2023-03-23 LAB — VITAMIN D 25 HYDROXY (VIT D DEFICIENCY, FRACTURES): Vit D, 25-Hydroxy: 16.4 ng/mL — ABNORMAL LOW (ref 30.0–100.0)

## 2023-03-23 LAB — TSH: TSH: 0.97 u[IU]/mL (ref 0.450–4.500)

## 2023-03-23 LAB — PSA: Prostate Specific Ag, Serum: 0.6 ng/mL (ref 0.0–4.0)

## 2023-03-23 NOTE — Progress Notes (Signed)
 Contacted via MyChart   Good day Casey Velez, your labs have returned: - Kidney function, creatinine and eGFR, remains normal.  Liver function, AST and ALT, shows mild elevation in AST and normal ALT which we will continue to monitor. - Definitely restart your Atorvastatin daily, your lipid panel shows elevations above goal. - Vitamin D is low, I recommend you take Vitamin D3 2000 units daily for bone health. - Prostate level is normal.  Any questions? Keep being amazing!!  Thank you for allowing me to participate in your care.  I appreciate you. Kindest regards, Yoselyn Mcglade

## 2023-03-25 ENCOUNTER — Telehealth: Payer: Self-pay

## 2023-03-25 DIAGNOSIS — R911 Solitary pulmonary nodule: Secondary | ICD-10-CM

## 2023-03-25 NOTE — Telephone Encounter (Signed)
Left message for patient to call back. Referral has been placed.

## 2023-03-25 NOTE — Telephone Encounter (Signed)
-----   Message from Nobie Putnam sent at 03/22/2023  1:54 PM EST ----- Referral to lung nodule clinic.   Thanks, Josh ----- Message ----- From: Interface, Rad Results In Sent: 03/04/2023   4:24 PM EST To: Nobie Putnam, MD

## 2023-03-29 ENCOUNTER — Encounter: Payer: Self-pay | Admitting: Nurse Practitioner

## 2023-04-22 NOTE — Progress Notes (Unsigned)
 Electrophysiology Clinic Note    Date:  04/23/2023  Patient ID:  Casey Velez, Casey Velez 25-Nov-1965, MRN [id number] PCP:  Marjie Skiff, NP  Cardiologist:  Lorine Bears, MD Electrophysiologist: Nobie Putnam, MD  Discussed the use of AI scribe software for clinical note transcription with the patient, who gave verbal consent to proceed.   Patient Profile    Chief Complaint: AF ablation follow-up  History of Present Illness: Casey Velez is a 58 y.o. male with PMH notable for HFrEF 2/2 NICM, persis AFib, presumed OSA; seen today for Nobie Putnam, MD for routine electrophysiology followup.   He is s/p AFib ablation with PVI, posterior wall on 03/21/2023 by Dr. Jimmey Ralph.  On follow-up today, he is overall doing very well since his Afib ablation. His main symptom of Afib prior to procedure was SOB and feeling like he couldn't get a deep breath in. He has not noticed any further episodes with these symptoms. He has had very brief sensations in his chest, not palpitations exactly, but like a small electric shock - they are not painful in any way and very brief. No SOB with these episodes.  He stopped his amiodarone last week, 28 days after the ablation procedure as he was instructed.  He continues to take eliquis BID, no bleeding concerns.  Bilateral groin sites are without complaint, no bruising on tenders.   He uses CPAP nightly, but it is not ordered or managed by a provider. He bought it OTC, and has noticed significant improvement in his sleep quality since use.    Arrhythmia/Device History Amiodarone - 11/2022      ROS:  Please see the history of present illness. All other systems are reviewed and otherwise negative.    Physical Exam    VS:  BP 122/78   Pulse (!) 58   Ht 5\' 6"  (1.676 m)   Wt (!) 323 lb (146.5 kg)   SpO2 97%   BMI 52.13 kg/m  BMI: Body mass index is 52.13 kg/m.  Wt Readings from Last 3 Encounters:  04/23/23 (!) 323 lb (146.5 kg)  03/22/23 (!)  317 lb 9.6 oz (144.1 kg)  03/21/23 (!) 310 lb (140.6 kg)     GEN- The patient is well appearing, alert and oriented x 3 today.   Lungs- Clear to ausculation bilaterally, normal work of breathing.  Heart- Regular rate and rhythm, no murmurs, rubs or gallops Extremities- 1-2+ peripheral edema, warm, dry; bilateral groin sites c/d/I without ecchymosis    Studies Reviewed   Previous EP, cardiology notes.    EKG is ordered. Personal review of EKG from today shows:    EKG Interpretation Date/Time:  Tuesday April 23 2023 10:02:47 EDT Ventricular Rate:  59 PR Interval:  178 QRS Duration:  114 QT Interval:  464 QTC Calculation: 459 R Axis:   -53  Text Interpretation: Sinus bradycardia Left axis deviation Confirmed by Sherie Don [phone]) on 04/23/2023 10:13:46 AM     Cardiac CT, 03/04/2023 1. There is normal pulmonary vein drainage into the left atrium.  2. The left atrial appendage is a Windsock-cactus type with ostial size 28 x 16 mm and length 34 mm, Area 33 mm2. There is no thrombus in the left atrial appendage.   3. The esophagus runs in the left atrial midline and is not in the proximity to any of the pulmonary veins.  4. Coronary calcium score of 1034. This is 98th percentile for age/gender.  R/LHC, 01/11/2023   Prox  LAD to Mid LAD lesion is 30% stenosed.   There is moderate to severe left ventricular systolic dysfunction.   LV end diastolic pressure is normal.   The left ventricular ejection fraction is 25-35% by visual estimate.  TEE, 11/26/2022 1. Left ventricular ejection fraction, by estimation, is 30%. The left ventricle has moderate to severely decreased function.   2. Right ventricular systolic function is moderately reduced. The right ventricular size is normal.   3. Left atrial size was moderately dilated. No left atrial/left atrial appendage thrombus was detected.   4. Right atrial size was mildly dilated.   5. The mitral valve is normal in structure. Mild mitral  valve regurgitation.   6. The aortic valve is tricuspid. Aortic valve regurgitation is not visualized. Aortic valve sclerosis is present, with no evidence of aortic valve stenosis.   TTE, 11/19/2022  1. Left ventricular ejection fraction, by estimation, is 30 to 35%. The left ventricle has moderate to severely decreased function. The left ventricle demonstrates global hypokinesis. The left ventricular internal cavity size was moderately dilated. There is mild left ventricular hypertrophy. Left ventricular diastolic function could not be evaluated.   2. Right ventricular systolic function is moderately reduced. The right ventricular size is mildly enlarged. There is moderately elevated pulmonary artery systolic pressure.   3. Left atrial size was mildly dilated.   4. A small pericardial effusion is present.   5. The mitral valve is normal in structure. Mild mitral valve regurgitation.   6. Tricuspid valve regurgitation is mild to moderate.   7. The aortic valve is tricuspid. Aortic valve regurgitation is not visualized. No aortic stenosis is present.   8. There is mild dilatation of the ascending aorta, measuring 42 mm.   9. The inferior vena cava is dilated in size with <50% respiratory variability, suggesting right atrial pressure of 15 mmHg.    Assessment and Plan     #) persis AFib S/p AF ablation 03/2023 by Dr. Jimmey Ralph Maintaining sinus rhythm since Stopped amiodarone d/t young age If AF recurs, would recommend tikosyn Update TTE to re-eval LVEF at follow-up  #) Hypercoag d/t persis afib CHA2DS2-VASc Score = at least 2 [CHF History: 1, HTN History: 0, Diabetes History: 1, Stroke History: 0, Vascular Disease History: 0, Age Score: 0, Gender Score: 0].  Therefore, the patient's annual risk of stroke is 2.2 %.    Stroke ppx - 5mg  eliquis BID, appropriately dosed No bleeding concerns  #) HFrEF Warm on exam Update TTE to eval LVEF at follow-up now that he is maintaining sinus Continue  toprol, lasix, jardianc as per gen cards Soft BP limits up-titration of GDMT May need ICD if LVEF remains reduced        Current medicines are reviewed at length with the patient today.   The patient does not have concerns regarding his medicines.  The following changes were made today:  none  Labs/ tests ordered today include:  Orders Placed This Encounter  Procedures   EKG 12-Lead     Disposition: Follow up with Dr. Jimmey Ralph or EP APP  in 2 months    Signed, Sherie Don, NP  04/23/23  12:22 PM  Electrophysiology [facility]

## 2023-04-23 ENCOUNTER — Ambulatory Visit: Payer: PRIVATE HEALTH INSURANCE | Attending: Cardiology | Admitting: Cardiology

## 2023-04-23 ENCOUNTER — Encounter: Payer: Self-pay | Admitting: Cardiology

## 2023-04-23 VITALS — BP 122/78 | HR 58 | Ht 66.0 in | Wt 323.0 lb

## 2023-04-23 DIAGNOSIS — D6869 Other thrombophilia: Secondary | ICD-10-CM

## 2023-04-23 DIAGNOSIS — I4819 Other persistent atrial fibrillation: Secondary | ICD-10-CM | POA: Diagnosis not present

## 2023-04-23 DIAGNOSIS — I5022 Chronic systolic (congestive) heart failure: Secondary | ICD-10-CM | POA: Diagnosis not present

## 2023-04-23 NOTE — Patient Instructions (Signed)
 Medication Instructions:   STOP amiodarone  *If you need a refill on your cardiac medications before your next appointment, please call your pharmacy*    Follow-Up: At [facility], you and your health needs are our priority.  As part of our continuing mission to provide you with exceptional heart care, we have created designated Provider Care Teams.  These Care Teams include your primary Cardiologist (physician) and Advanced Practice Providers (APPs -  Physician Assistants and Nurse Practitioners) who all work together to provide you with the care you need, when you need it.  We recommend signing up for the patient portal called "MyChart".  Sign up information is provided on this After Visit Summary.  MyChart is used to connect with patients for Virtual Visits (Telemedicine).  Patients are able to view lab/test results, encounter notes, upcoming appointments, etc.  Non-urgent messages can be sent to your provider as well.   To learn more about what you can do with MyChart, go to [email or web address].    Your next appointment:   2 month(s)  Provider:   Sherie Don, NP or Dr. Michele Rockers     Other Instructions  Increase physical activity Limit sodium in diet Consider meeting with diabetic nutritionist

## 2023-05-21 ENCOUNTER — Ambulatory Visit: Payer: PRIVATE HEALTH INSURANCE | Admitting: Cardiology

## 2023-05-30 ENCOUNTER — Ambulatory Visit: Payer: PRIVATE HEALTH INSURANCE | Admitting: Cardiovascular Disease

## 2023-06-12 ENCOUNTER — Other Ambulatory Visit: Payer: Self-pay | Admitting: Nurse Practitioner

## 2023-06-14 NOTE — Telephone Encounter (Signed)
 Requested Prescriptions  Pending Prescriptions Disp Refills   JARDIANCE  10 MG TABS tablet [Pharmacy Med Name: JARDIANCE  10MG  TABLETS] 90 tablet 0    Sig: TAKE 1 TABLET(10 MG) BY MOUTH DAILY     Endocrinology:  Diabetes - SGLT2 Inhibitors Passed - 06/14/2023  9:04 AM      Passed - Cr in normal range and within 360 days    Creatinine, Ser  Date Value Ref Range Status  03/22/2023 0.86 0.76 - 1.27 mg/dL Final         Passed - HBA1C is between 0 and 7.9 and within 180 days    HB A1C (BAYER DCA - WAIVED)  Date Value Ref Range Status  03/22/2023 5.9 (H) 4.8 - 5.6 % Final    Comment:             Prediabetes: 5.7 - 6.4          Diabetes: >6.4          Glycemic control for adults with diabetes: <7.0          Passed - eGFR in normal range and within 360 days    GFR calc Af Amer  Date Value Ref Range Status  05/08/2017 >60 >60 mL/min Final    Comment:    (NOTE) The eGFR has been calculated using the CKD EPI equation. This calculation has not been validated in all clinical situations. eGFR's persistently <60 mL/min signify possible Chronic Kidney Disease.    GFR, Estimated  Date Value Ref Range Status  03/04/2023 >60 >60 mL/min Final    Comment:    (NOTE) Calculated using the CKD-EPI Creatinine Equation (2021)    eGFR  Date Value Ref Range Status  03/22/2023 102 >59 mL/min/1.73 Final         Passed - Valid encounter within last 6 months    Recent Outpatient Visits           2 months ago Type 2 diabetes mellitus with morbid obesity (HCC)   [place] [facility] [place], Lavelle Posey, NP       Future Appointments             In 4 days Riddle, Suzann, NP [facility] [place]   In 1 week [place], Lavelle Posey, NP [facility]

## 2023-06-16 NOTE — Patient Instructions (Incomplete)
Be Involved in Caring For Your Health:  Taking Medications When medications are taken as directed, they can greatly improve your health. But if they are not taken as prescribed, they may not work. In some cases, not taking them correctly can be harmful. To help ensure your treatment remains effective and safe, understand your medications and how to take them. Bring your medications to each visit for review by your provider.  Your lab results, notes, and after visit summary will be available on My Chart. We strongly encourage you to use this feature. If lab results are abnormal the clinic will contact you with the appropriate steps. If the clinic does not contact you assume the results are satisfactory. You can always view your results on My Chart. If you have questions regarding your health or results, please contact the clinic during office hours. You can also ask questions on My Chart.  We at [facility] are grateful that you chose [place] to provide your care. We strive to provide evidence-based and compassionate care and are always looking for feedback. If you get a survey from the clinic please complete this so we can hear your opinions.  Diabetes Mellitus and Exercise Regular exercise is important for your health, especially if you have diabetes mellitus. Exercise is not just about losing weight. It can also help you increase muscle strength and bone density and reduce body fat and stress. This can help your level of endurance and make you more fit and flexible. Why should I exercise if I have diabetes? Exercise has many benefits for people with diabetes. It can: Help lower and control your blood sugar (glucose). Help your body respond better and become more sensitive to the hormone insulin. Reduce how much insulin your body needs. Lower your risk for heart disease by: Lowering how much "bad" cholesterol and triglycerides you have in your body. Increasing how much "good" cholesterol  you have in your body. Lowering your blood pressure. Lowering your blood glucose levels. What is my activity plan? Your health care provider or an expert trained in diabetes care (certified diabetes educator) can help you make an activity plan. This plan can help you find the type of exercise that works for you. It may also tell you how often to exercise and for how long. Be sure to: Get at least 150 minutes of medium-intensity or high-intensity exercise each week. This may involve brisk walking, biking, or water aerobics. Do stretching and strengthening exercises at least 2 times a week. This may involve yoga or weight lifting. Spread out your activity over at least 3 days of the week. Get some form of physical activity each day. Do not go more than 2 days in a row without some kind of activity. Avoid being inactive for more than 30 minutes at a time. Take frequent breaks to walk or stretch. Choose activities that you enjoy. Set goals that you know you can accomplish. Start slowly and increase the intensity of your exercise over time. How do I manage my diabetes during exercise?  Monitor your blood glucose Check your blood glucose before and after you exercise. If your blood glucose is 240 mg/dL (40.9 mmol/L) or higher before you exercise, check your urine for ketones. These are chemicals created by the liver. If you have ketones in your urine, do not exercise until your blood glucose returns to normal. If your blood glucose is 100 mg/dL (5.6 mmol/L) or lower, eat a snack that has 15-20 grams of carbohydrate in  it. Check your blood glucose 15 minutes after the snack to make sure that your level is above 100 mg/dL (5.6 mmol/L) before you start to exercise. Your risk for low blood glucose (hypoglycemia) goes up during and after exercise. Know the symptoms of this condition and how to treat it. Follow these instructions at home: Keep a carbohydrate snack on hand for use before, during, and after  exercise. This can help prevent or treat hypoglycemia. Avoid injecting insulin into parts of your body that are going to be used during exercise. This may include: Your arms, when you are going to play tennis. Your legs, when you are about to go jogging. Keep track of your exercise habits. This can help you and your health care provider watch and adjust your activity plan. Write down: What you eat before and after you exercise. Blood glucose levels before and after you exercise. The type and amount of exercise you do. Talk to your health care provider before you start a new activity. They may need to: Make sure that the activity is safe for you. Adjust your insulin, other medicines, and food that you eat. Drink water while you exercise. This can stop you from losing too much water (dehydration). It can also prevent problems caused by having a lot of heat in your body (heat stroke). Where to find more information American Diabetes Association: diabetes.org Association of Diabetes Care & Education Specialists: diabeteseducator.org This information is not intended to replace advice given to you by your health care provider. Make sure you discuss any questions you have with your health care provider. Document Revised: 07/12/2021 Document Reviewed: 07/12/2021 Elsevier Patient Education  2024 [employer].

## 2023-06-16 NOTE — Progress Notes (Deleted)
 Electrophysiology Clinic Note    Date:  06/16/2023  Patient ID:  Casey Velez, Casey Velez Jan 05, 1966, MRN [id number] PCP:  Lemar Pyles, NP  Cardiologist:  Antionette Kirks, MD Electrophysiologist: Ardeen Kohler, MD  Discussed the use of AI scribe software for clinical note transcription with the patient, who gave verbal consent to proceed.   Patient Profile    Chief Complaint: 3 mon AF ablation follow-up  History of Present Illness: Casey Velez is a 58 y.o. male with PMH notable for HFrEF 2/2 NICM, persis AFib, presumed OSA; seen today for Ardeen Kohler, MD for routine electrophysiology followup.   He is s/p AFib ablation with PVI, posterior wall on 03/21/2023 by Dr. Daneil Dunker. I saw him for routine 1-mon post-ablation appt where he was maintaining sinus. Stopped amiodarone  d/t young age.   On follow-up today,  *** AF burden, symptoms *** palpitations *** bleeding concerns  - needs updated TTE   Arrhythmia/Device History Amiodarone  - 11/2022      ROS:  Please see the history of present illness. All other systems are reviewed and otherwise negative.    Physical Exam    VS:  There were no vitals taken for this visit. BMI: There is no height or weight on file to calculate BMI.  Wt Readings from Last 3 Encounters:  04/23/23 (!) 323 lb (146.5 kg)  03/22/23 (!) 317 lb 9.6 oz (144.1 kg)  03/21/23 (!) 310 lb (140.6 kg)     GEN- The patient is well appearing, alert and oriented x 3 today.   Lungs- Clear to ausculation bilaterally, normal work of breathing.  Heart- Regular rate and rhythm, no murmurs, rubs or gallops Extremities- 1-2+ peripheral edema, warm, dry; bilateral groin sites c/d/I without ecchymosis    Studies Reviewed   Previous EP, cardiology notes.    EKG is ordered. Personal review of EKG from today shows:          Cardiac CT, 03/04/2023 1. There is normal pulmonary vein drainage into the left atrium.  2. The left atrial appendage is a  Windsock-cactus type with ostial size 28 x 16 mm and length 34 mm, Area 33 mm2. There is no thrombus in the left atrial appendage.   3. The esophagus runs in the left atrial midline and is not in the proximity to any of the pulmonary veins.  4. Coronary calcium  score of 1034. This is 98th percentile for age/gender.  R/LHC, 01/11/2023   Prox LAD to Mid LAD lesion is 30% stenosed.   There is moderate to severe left ventricular systolic dysfunction.   LV end diastolic pressure is normal.   The left ventricular ejection fraction is 25-35% by visual estimate.  TEE, 11/26/2022 1. Left ventricular ejection fraction, by estimation, is 30%. The left ventricle has moderate to severely decreased function.   2. Right ventricular systolic function is moderately reduced. The right ventricular size is normal.   3. Left atrial size was moderately dilated. No left atrial/left atrial appendage thrombus was detected.   4. Right atrial size was mildly dilated.   5. The mitral valve is normal in structure. Mild mitral valve regurgitation.   6. The aortic valve is tricuspid. Aortic valve regurgitation is not visualized. Aortic valve sclerosis is present, with no evidence of aortic valve stenosis.   TTE, 11/19/2022  1. Left ventricular ejection fraction, by estimation, is 30 to 35%. The left ventricle has moderate to severely decreased function. The left ventricle demonstrates global hypokinesis. The left ventricular  internal cavity size was moderately dilated. There is mild left ventricular hypertrophy. Left ventricular diastolic function could not be evaluated.   2. Right ventricular systolic function is moderately reduced. The right ventricular size is mildly enlarged. There is moderately elevated pulmonary artery systolic pressure.   3. Left atrial size was mildly dilated.   4. A small pericardial effusion is present.   5. The mitral valve is normal in structure. Mild mitral valve regurgitation.   6. Tricuspid  valve regurgitation is mild to moderate.   7. The aortic valve is tricuspid. Aortic valve regurgitation is not visualized. No aortic stenosis is present.   8. There is mild dilatation of the ascending aorta, measuring 42 mm.   9. The inferior vena cava is dilated in size with <50% respiratory variability, suggesting right atrial pressure of 15 mmHg.    Assessment and Plan     #) persis AFib S/p AF ablation 03/2023 by Dr. Daneil Dunker Maintaining sinus rhythm since Stopped amiodarone  d/t young age If AF recurs, would recommend tikosyn Update TTE to re-eval LVEF at follow-up  #) Hypercoag d/t persis afib CHA2DS2-VASc Score = at least 2 [CHF History: 1, HTN History: 0, Diabetes History: 1, Stroke History: 0, Vascular Disease History: 0, Age Score: 0, Gender Score: 0].  Therefore, the patient's annual risk of stroke is 2.2 %.    Stroke ppx - 5mg  eliquis  BID, appropriately dosed No bleeding concerns  #) HFrEF Warm on exam Update TTE to eval LVEF at follow-up now that he is maintaining sinus Continue toprol , lasix , jardiance  as per gen cards Soft BP limits up-titration of GDMT May need ICD if LVEF remains reduced        Current medicines are reviewed at length with the patient today.   The patient does not have concerns regarding his medicines.  The following changes were made today:  none  Labs/ tests ordered today include:  No orders of the defined types were placed in this encounter.    Disposition: Follow up with Dr. Daneil Dunker or EP APP in 2 months    Signed, Aibhlinn Kalmar, NP  06/16/23  6:18 PM  Electrophysiology [facility]

## 2023-06-18 ENCOUNTER — Ambulatory Visit: Payer: PRIVATE HEALTH INSURANCE | Attending: Cardiology | Admitting: Cardiology

## 2023-06-18 DIAGNOSIS — I5022 Chronic systolic (congestive) heart failure: Secondary | ICD-10-CM

## 2023-06-18 DIAGNOSIS — I4819 Other persistent atrial fibrillation: Secondary | ICD-10-CM

## 2023-06-18 DIAGNOSIS — D6869 Other thrombophilia: Secondary | ICD-10-CM

## 2023-06-21 ENCOUNTER — Ambulatory Visit: Payer: PRIVATE HEALTH INSURANCE | Admitting: Nurse Practitioner

## 2023-06-21 ENCOUNTER — Encounter: Payer: Self-pay | Admitting: Nurse Practitioner

## 2023-06-21 DIAGNOSIS — E1169 Type 2 diabetes mellitus with other specified complication: Secondary | ICD-10-CM

## 2023-06-21 DIAGNOSIS — E119 Type 2 diabetes mellitus without complications: Secondary | ICD-10-CM

## 2023-06-21 DIAGNOSIS — I4891 Unspecified atrial fibrillation: Secondary | ICD-10-CM

## 2023-06-21 DIAGNOSIS — Z6841 Body Mass Index (BMI) 40.0 and over, adult: Secondary | ICD-10-CM

## 2023-06-21 DIAGNOSIS — I5022 Chronic systolic (congestive) heart failure: Secondary | ICD-10-CM

## 2023-06-21 DIAGNOSIS — G4733 Obstructive sleep apnea (adult) (pediatric): Secondary | ICD-10-CM

## 2023-06-21 DIAGNOSIS — I420 Dilated cardiomyopathy: Secondary | ICD-10-CM

## 2023-12-18 ENCOUNTER — Other Ambulatory Visit (HOSPITAL_COMMUNITY): Payer: Self-pay

## 2023-12-18 MED ORDER — APIXABAN 5 MG PO TABS: 5.0000 mg | ORAL_TABLET | Freq: Two times a day (BID) | ORAL | Status: DC

## 2023-12-18 NOTE — Telephone Encounter (Signed)
 Patient has nurse, learning disability coverage so they are not eligible for any patient assistance.

## 2023-12-21 NOTE — Progress Notes (Unsigned)
 Cardiology Clinic Note   Date: 12/24/2023 ID: Joaopedro, Casey Velez 1966/01/28, MRN [id number]  Primary Cardiologist:  Deatrice Cage, MD  Chief Complaint   Casey Velez is a 58 y.o. male who presents to the clinic today for routine follow up.   Patient Profile   Casey Velez is followed by Dr. Cage for the history outlined below.      Past medical history significant for: Nonobstructive CAD. R/LHC 01/11/2023: Proximal to mid LAD 30%.   Chronic HFrEF/nonischemic cardiomyopathy. R/LHC 01/11/2023: Moderate to severely reduced left ventricular systolic function, EF 30%.  Normal filling pressures, normal pulmonary pressure and moderately to severely reduced cardiac index.  A-fib with RVR noted before starting case.  Recommend cardioversion and EP consult.  Cardiomyopathy likely tachycardia induced. PAF. Onset October 2024. TEE/DCCV 11/26/2022: No LA/LAA thrombus.  Unsuccessful after 3 shocks. DCCV 11/29/2022: Successful after 3 shocks. A-fib ablation 03/21/2023. Chronic HFrEF. Echo 11/19/2022: EF 30 to 35%.  Global hypokinesis.  Mild LVH.  Diastolic function could not be evaluated.  Moderately reduced RV function.  Mild RVH.  Moderately elevated PA pressure.  Mild LAE.  Small pericardial effusion.  Mild MR.  Mild to moderate TR.  Mild dilatation of ascending aorta 42 mm.  Dilated IVC, RA pressure 15 mmHg. OSA. On CPAP. Hyperlipidemia. Lipid panel 11/19/2022: LDL 33, HDL 18, TG 25, total 56. T2DM. GERD.  In summary, patient presented to the ED on 11/19/2022 with complaints of exertional shortness of breath and productive cough x 1 month.  EKG revealed A-fib with RVR rates in the 150s.  He was started on IV diltiazem .  Chest x-ray showed no acute process.  Initial labs: Hemoglobin 14.9, potassium 4.0, sodium 140, calcium  8.8, creatinine 1.27, magnesium 1.9, BNP 334.5, TSH 2.290.  Troponin 8>> 9.  Echo showed moderate to severely decreased LV function (details above).  Diltiazem  was  discontinued and patient was started on metoprolol  for rate control.  Plan for TEE/DCCV once volume optimized.  Rates continue to be labile on metoprolol .  Given digoxin  IV x 2 prior to transition to oral digoxin .  Heart rates improved to 90-100s, higher with ambulation.  Unsuccessful DCCV on 11/26/2022.  It was felt obesity and OSA contributing to unsuccessful attempts.  Patient started on amiodarone  infusion but subsequently discontinued secondary to concern for QT prolongation.  Rhythm strips were reviewed and QT was felt to be within normal limits with variable QTc in setting of A-fib.  Amiodarone  was resumed.  Successful DCCV on 11/29/2022.  Patient transition to p.o. amiodarone  and discharged on 11/30/2022.  Patient was seen in follow-up on 12/14/2022.  He reported he was doing well with no chest pain, shortness of breath, or palpitations.  He was maintaining sinus rhythm however EKG showed new anterolateral T wave abnormalities.  He was provided with ED precautions and scheduled to return to be set up for [facility] after 4 weeks of uninterrupted anticoagulation post cardioversion.   R/LHC demonstrated nonobstructive CAD with normal filling pressures.  Patient was seen in the clinic following heart catheterization on 01/29/2023.  He was maintaining sinus rhythm at that time.  No medication changes were made.  He was evaluated by Dr. Kennyth on 02/19/2023 and agreed to proceed with ablation which he underwent on 03/21/2023.  He followed up with he followed up with Suzann Riddle, NP on 04/23/2023 post ablation.  He denied further episodes of A-fib.  No medication changes were made.     History of Present Illness  Today, patient reports he is doing well. Patient denies shortness of breath, dyspnea on exertion, orthopnea or PND. He has noticed lower extremity edema. He feels his weight is up. He was previously taking Lasix  from a friend of his but has not had it in quite some time.  No chest pain, pressure, or  tightness. No palpitations. When he is in afib he feels short of breath.  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 He works full time on his feet for his 8 hour shift, walking, pulling parts, stocking shelves and doing heavy lifting. He does not participate in routine exercise. He has been out of Eliquis  for about 1 week and Jardiance  for several weeks.     ROS: All other systems reviewed and are otherwise negative except as noted in History of Present Illness.  EKGs/Labs Reviewed    EKG Interpretation Date/Time:  Tuesday December 24 2023 08:18:58 EST Ventricular Rate:  56 PR Interval:  182 QRS Duration:  104 QT Interval:  476 QTC Calculation: 459 R Axis:   -48  Text Interpretation: Sinus bradycardia Incomplete right bundle branch block Left anterior fascicular block When compared with ECG of 23-Apr-2023 10:02, No significant change was found Confirmed by Loistine Sober [phone]) on 12/24/2023 8:24:38 AM   03/22/2023: ALT 34; AST 79 12/24/2023: BUN 15; Creatinine, Ser 0.81; Potassium 4.5; Sodium 136   12/24/2023: Hemoglobin 15.0; WBC 7.3   03/22/2023: TSH 0.970    Risk Assessment/Calculations     CHA2DS2-VASc Score = 2   This indicates a 2.2% annual risk of stroke. The patient's score is based upon: CHF History: 1 HTN History: 0 Diabetes History: 1 Stroke History: 0 Vascular Disease History: 0 Age Score: 0 Gender Score: 0             Physical Exam    VS:  BP 130/80 (BP Location: Left Arm, Patient Position: Sitting, Cuff Size: Large)   Pulse (!) 56   Ht 5' 6 (1.676 m)   Wt (!) 322 lb (146.1 kg)   SpO2 97%   BMI 51.97 kg/m  , BMI Body mass index is 51.97 kg/m.  GEN: Well nourished, well developed, in no acute distress. Neck: No JVD or carotid bruits. Cardiac:  RRR.  No murmur. No rubs or gallops.   Respiratory:  Respirations regular and unlabored. Clear to auscultation without rales, wheezing or rhonchi. GI: Soft, nontender, nondistended. Extremities:  Radials/DP/PT 2+ and equal bilaterally. No clubbing or cyanosis. 1+ pitting edema bilateral lower extremities.   Skin: Warm and dry, no rash. Neuro: Strength intact.  Assessment & Plan   Nonobstructive CAD Per angiography December 2024.  Patient denies chest pain,pressure or tightness. He does not participate in routine exercise. 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 -Continue Toprol , atorvastatin .  Not on aspirin  secondary to Eliquis .   PAF S/p A-fib ablation February 2025.  Denies spontaneous bleeding concerns.  Patient does not experience palpitations when in A-fib but instead feels short of breath.  He has not felt as though he has gone into A-fib since ablation.  Unfortunately he has been out of Eliquis  for 1 week.  Discussed the importance of consistent use of Eliquis  for stroke prophylaxis.  He voiced understanding.  EKG today demonstrates sinus bradycardia 56 bpm. -Continue amiodarone , Toprol , Eliquis  (will send in refill). - CBC today.   Chronic HFrEF/Tachy-mediated  cardiomyopathy Echo October 2024 showed EF 30 to 35%, global hypokinesis, moderately reduced RV function, mild LVH/RVH, moderately elevated PA pressure.  [facility] December 2024 showed moderate to severely reduced LV systolic function, normal filling pressures, pulmonary pressure, and moderate to severely reduced cardiac index.  Patient reports lower extremity edema.  He feels his weight is up.  He denies shortness of breath, DOE, orthopnea or PND.  1+ pitting edema bilateral lower extremities otherwise euvolemic and well compensated on exam.  He has been out of Jardiance  for several weeks.  He had previously been taking a friend's Lasix .  He has not had any doses in quite some time. GDMT has previously been limited secondary to soft BP.  Today's BP is 138/84 on intake and 130/80 on my recheck.  He has not been checking  it at home.  He will start keeping a log of BP and give us  the readings when we call to give him results of his echo.  He understands medications may be added at that time depending what his echo shows. -Continue Toprol , Jardiance  (will send in refills). - Add Lasix  20 mg daily as needed for weight gain of 3 pounds overnight and 5 pounds in a week. - Weigh daily. - Schedule echo. - BMP today.  Disposition: CBC and BMP today.  Schedule echo.  Lasix  20 mg daily refill Eliquis  and Jardiance .  Return in 3 months or sooner as needed.         Signed, Barnie HERO. Avrum Kimball, DNP, NP-C

## 2023-12-24 ENCOUNTER — Ambulatory Visit: Payer: Self-pay | Attending: Student | Admitting: Student

## 2023-12-24 ENCOUNTER — Encounter: Payer: Self-pay | Admitting: Student

## 2023-12-24 ENCOUNTER — Other Ambulatory Visit
Admission: RE | Admit: 2023-12-24 | Discharge: 2023-12-24 | Disposition: A | Discharge status: Home / Self Care | Source: Ambulatory Visit | Attending: Student | Admitting: Student

## 2023-12-24 ENCOUNTER — Other Ambulatory Visit (HOSPITAL_COMMUNITY): Payer: Self-pay

## 2023-12-24 VITALS — BP 130/80 | HR 56 | Ht 66.0 in | Wt 322.0 lb

## 2023-12-24 DIAGNOSIS — I5022 Chronic systolic (congestive) heart failure: Secondary | ICD-10-CM

## 2023-12-24 DIAGNOSIS — I428 Other cardiomyopathies: Secondary | ICD-10-CM

## 2023-12-24 DIAGNOSIS — I2510 Atherosclerotic heart disease of native coronary artery without angina pectoris: Secondary | ICD-10-CM | POA: Diagnosis not present

## 2023-12-24 DIAGNOSIS — Z79899 Other long term (current) drug therapy: Secondary | ICD-10-CM

## 2023-12-24 DIAGNOSIS — I480 Paroxysmal atrial fibrillation: Secondary | ICD-10-CM

## 2023-12-24 LAB — CBC
HCT: 44.1 % (ref 39.0–52.0)
Hemoglobin: 15 g/dL (ref 13.0–17.0)
MCH: 30.4 pg (ref 26.0–34.0)
MCHC: 34 g/dL (ref 30.0–36.0)
MCV: 89.5 fL (ref 80.0–100.0)
Platelets: 242 K/uL (ref 150–400)
RBC: 4.93 MIL/uL (ref 4.22–5.81)
RDW: 12.3 % (ref 11.5–15.5)
WBC: 7.3 K/uL (ref 4.0–10.5)
nRBC: 0 % (ref 0.0–0.2)

## 2023-12-24 LAB — BASIC METABOLIC PANEL WITH GFR
Anion gap: 9 (ref 5–15)
BUN: 15 mg/dL (ref 6–20)
CO2: 24 mmol/L (ref 22–32)
Calcium: 8.8 mg/dL — ABNORMAL LOW (ref 8.9–10.3)
Chloride: 104 mmol/L (ref 98–111)
Creatinine, Ser: 0.81 mg/dL (ref 0.61–1.24)
GFR, Estimated: >60 mL/min (ref >60)
Glucose, Bld: 107 mg/dL — ABNORMAL HIGH (ref 70–99)
Potassium: 4.5 mmol/L (ref 3.5–5.1)
Sodium: 136 mmol/L (ref 135–145)

## 2023-12-24 MED ORDER — EMPAGLIFLOZIN 10 MG PO TABS: 10.0000 mg | ORAL_TABLET | Freq: Every day | ORAL | 3 refills | Status: DC

## 2023-12-24 MED ORDER — APIXABAN 5 MG PO TABS: 5.0000 mg | ORAL_TABLET | Freq: Two times a day (BID) | ORAL | 3 refills | Status: AC

## 2023-12-24 MED ORDER — FUROSEMIDE 20 MG PO TABS: 20.0000 mg | ORAL_TABLET | ORAL | 3 refills | Status: AC | PRN

## 2023-12-24 NOTE — Patient Instructions (Signed)
 Medication Instructions:  - START lasix  20 mg as needed for weight gain of 3 lbs in one night, or 5 lbs in one week  *If you need a refill on your cardiac medications before your next appointment, please call your pharmacy*  Lab Work: Your provider would like for you to have following labs drawn today CBC, BMP.   If you have labs (blood work) drawn today and your tests are completely normal, you will receive your results only by: MyChart Message (if you have MyChart) OR A paper copy in the mail If you have any lab test that is abnormal or we need to change your treatment, we will call you to review the results.  Testing/Procedures: Your physician has requested that you have an echocardiogram. Echocardiography is a painless test that uses sound waves to create images of your heart. It provides your doctor with information about the size and shape of your heart and how well your heart's chambers and valves are working.   You may receive an ultrasound enhancing agent through an IV if needed to better visualize your heart during the echo. This procedure takes approximately one hour.  There are no restrictions for this procedure.  This will take place at [address] [facility], [place] [address]  Please note: We ask at that you not bring children with you during ultrasound (echo/ vascular) testing. Due to room size and safety concerns, children are not allowed in the ultrasound rooms during exams. Our front office staff cannot provide observation of children in our lobby area while testing is being conducted. An adult accompanying a patient to their appointment will only be allowed in the ultrasound room at the discretion of the ultrasound technician under special circumstances. We apologize for any inconvenience.   Follow-Up: At [facility], you and your health needs are our priority.  As part of our continuing mission to provide you with exceptional heart care,  our providers are all part of one team.  This team includes your primary Cardiologist (physician) and Advanced Practice Providers or APPs (Physician Assistants and Nurse Practitioners) who all work together to provide you with the care you need, when you need it.  Your next appointment:   3 month(s)  Provider:   You may see Deatrice Cage, MD or one of the following Advanced Practice Providers on your designated Care Team:   Barnie Hila, NP  We recommend signing up for the patient portal called MyChart.  Sign up information is provided on this After Visit Summary.  MyChart is used to connect with patients for Virtual Visits (Telemedicine).  Patients are able to view lab/test results, encounter notes, upcoming appointments, etc.  Non-urgent messages can be sent to your provider as well.   To learn more about what you can do with MyChart, go to [email or web address].

## 2023-12-25 ENCOUNTER — Encounter: Payer: Self-pay | Admitting: Pharmacy Technician

## 2023-12-25 NOTE — Telephone Encounter (Signed)
 Already being worked on in latent by someone else

## 2023-12-26 ENCOUNTER — Ambulatory Visit: Payer: Self-pay | Admitting: Student

## 2023-12-28 NOTE — Patient Instructions (Signed)
 Be Involved in Caring For Your Health:  Taking Medications When medications are taken as directed, they can greatly improve your health. But if they are not taken as prescribed, they may not work. In some cases, not taking them correctly can be harmful. To help ensure your treatment remains effective and safe, understand your medications and how to take them. Bring your medications to each visit for review by your provider.  Your lab results, notes, and after visit summary will be available on My Chart. We strongly encourage you to use this feature. If lab results are abnormal the clinic will contact you with the appropriate steps. If the clinic does not contact you assume the results are satisfactory. You can always view your results on My Chart. If you have questions regarding your health or results, please contact the clinic during office hours. You can also ask questions on My Chart.  We at [facility] are grateful that you chose [place] to provide your care. We strive to provide evidence-based and compassionate care and are always looking for feedback. If you get a survey from the clinic please complete this so we can hear your opinions.  Diabetes Mellitus and Foot Care Diabetes, also called diabetes mellitus, may cause problems with your feet and legs because of poor blood flow (circulation). Poor circulation may make your skin: Become thinner and drier. Break more easily. Heal more slowly. Peel and crack. You may also have nerve damage (neuropathy). This can cause decreased feeling in your legs and feet. This means that you may not notice minor injuries to your feet that could lead to more serious problems. Finding and treating problems early is the best way to prevent future foot problems. How to care for your feet Foot hygiene  Wash your feet daily with warm water and mild soap. Do not use hot water. Then, pat your feet and the areas between your toes until they are fully dry. Do  not soak your feet. This can dry your skin. Trim your toenails straight across. Do not dig under them or around the cuticle. File the edges of your nails with an emery board or nail file. Apply a moisturizing lotion or petroleum jelly to the skin on your feet and to dry, brittle toenails. Use lotion that does not contain alcohol and is unscented. Do not apply lotion between your toes. Shoes and socks Wear clean socks or stockings every day. Make sure they are not too tight. Do not wear knee-high stockings. These may decrease blood flow to your legs. Wear shoes that fit well and have enough cushioning. Always look in your shoes before you put them on to be sure there are no objects inside. To break in new shoes, wear them for just a few hours a day. This prevents injuries on your feet. Wounds, scrapes, corns, and calluses  Check your feet daily for blisters, cuts, bruises, sores, and redness. If you cannot see the bottom of your feet, use a mirror or ask someone for help. Do not cut off corns or calluses or try to remove them with medicine. If you find a minor scrape, cut, or break in the skin on your feet, keep it and the skin around it clean and dry. You may clean these areas with mild soap and water. Do not clean the area with peroxide, alcohol, or iodine. If you have a wound, scrape, corn, or callus on your foot, look at it several times a day to make sure it  is healing and not infected. Check for: Redness, swelling, or pain. Fluid or blood. Warmth. Pus or a bad smell. General tips Do not cross your legs. This may decrease blood flow to your feet. Do not use heating pads or hot water bottles on your feet. They may burn your skin. If you have lost feeling in your feet or legs, you may not know this is happening until it is too late. Protect your feet from hot and cold by wearing shoes, such as at the beach or on hot pavement. Schedule a complete foot exam at least once a year or more often if  you have foot problems. Report any cuts, sores, or bruises to your health care provider right away. Where to find more information American Diabetes Association: diabetes.org Association of Diabetes Care & Education Specialists: diabeteseducator.org Contact a health care provider if: You have a condition that increases your risk of infection, and you have any cuts, sores, or bruises on your feet. You have an injury that is not healing. You have redness on your legs or feet. You feel burning or tingling in your legs or feet. You have pain or cramps in your legs and feet. Your legs or feet are numb. Your feet always feel cold. You have pain around any toenails. Get help right away if: You have a wound, scrape, corn, or callus on your foot and: You have signs of infection. You have a fever. You have a red line going up your leg. This information is not intended to replace advice given to you by your health care provider. Make sure you discuss any questions you have with your health care provider. Document Revised: 07/26/2021 Document Reviewed: 07/26/2021 Elsevier Patient Education  2024 [employer].

## 2023-12-30 ENCOUNTER — Ambulatory Visit: Payer: Self-pay | Admitting: Nurse Practitioner

## 2023-12-30 ENCOUNTER — Encounter: Payer: Self-pay | Admitting: Nurse Practitioner

## 2023-12-30 DIAGNOSIS — I420 Dilated cardiomyopathy: Secondary | ICD-10-CM

## 2023-12-30 DIAGNOSIS — E119 Type 2 diabetes mellitus without complications: Secondary | ICD-10-CM | POA: Diagnosis not present

## 2023-12-30 DIAGNOSIS — E1169 Type 2 diabetes mellitus with other specified complication: Secondary | ICD-10-CM | POA: Diagnosis not present

## 2023-12-30 DIAGNOSIS — I5022 Chronic systolic (congestive) heart failure: Secondary | ICD-10-CM | POA: Diagnosis not present

## 2023-12-30 DIAGNOSIS — I4891 Unspecified atrial fibrillation: Secondary | ICD-10-CM

## 2023-12-30 DIAGNOSIS — E785 Hyperlipidemia, unspecified: Secondary | ICD-10-CM

## 2023-12-30 DIAGNOSIS — Z23 Encounter for immunization: Secondary | ICD-10-CM

## 2023-12-30 DIAGNOSIS — G4733 Obstructive sleep apnea (adult) (pediatric): Secondary | ICD-10-CM

## 2023-12-30 DIAGNOSIS — Z7984 Long term (current) use of oral hypoglycemic drugs: Secondary | ICD-10-CM

## 2023-12-30 DIAGNOSIS — Z6841 Body Mass Index (BMI) 40.0 and over, adult: Secondary | ICD-10-CM

## 2023-12-30 LAB — BAYER DCA HB A1C WAIVED: HB A1C (BAYER DCA - WAIVED): 5.8 % — ABNORMAL HIGH (ref 4.8–5.6)

## 2023-12-30 MED ORDER — FAMOTIDINE 20 MG PO TABS: 20.0000 mg | ORAL_TABLET | Freq: Every morning | ORAL | 3 refills | Status: AC

## 2023-12-30 MED ORDER — METOPROLOL SUCCINATE ER 25 MG PO TB24: 25.0000 mg | ORAL_TABLET | Freq: Two times a day (BID) | ORAL | 3 refills | Status: AC

## 2023-12-30 MED ORDER — ATORVASTATIN CALCIUM 80 MG PO TABS: 80.0000 mg | ORAL_TABLET | Freq: Every day | ORAL | 3 refills | Status: AC

## 2023-12-30 MED ORDER — EMPAGLIFLOZIN 10 MG PO TABS: 10.0000 mg | ORAL_TABLET | Freq: Every day | ORAL | 3 refills | Status: AC

## 2023-12-30 NOTE — Assessment & Plan Note (Signed)
 Chronic, stable with A1c 5.8% today.  Urine ALB 30 February 2025.  Continue Jardiance  only at this time, which is cost effective for him. Will resend and have staff check on why it was not filled recently when cardiology sent in.  Continue to check BS every other day and document for visits.  Focus on diabetic diet.   - Eye exam up to date - Foot exam up to date - PCV20 needed, discussed with patient. Refuses this and Flu vaccine. - No ACE or ARB -- will consider in future, but currently BP on lower side at baseline.  Educated him on this. Jardiance  does offer kidney protection. - Statin on board.

## 2023-12-30 NOTE — Assessment & Plan Note (Signed)
 Chronic, stable.  Uses CPAP nightly.  Praised for this.

## 2023-12-30 NOTE — Progress Notes (Signed)
 BP 118/82 (BP Location: Left Arm, Patient Position: Sitting, Cuff Size: Large)   Pulse (!) 54   Temp 97.9 F (36.6 C) (Oral)   Resp 14   Ht 5' 5.98 (1.676 m)   Wt (!) 320 lb 12.8 oz (145.5 kg)   SpO2 98%   BMI 51.80 kg/m    Subjective:    Patient ID: Casey Velez, male    DOB: Jun 28, 1965, 58 y.o.   MRN: [id number]  HPI: Casey Velez is a 58 y.o. male  Chief Complaint  Patient presents with   Atrial Fibrillation    Has a cardiology check up next week.    Diabetes    Hasn't checked in a long time.    HTN/HLD    Likely has been a little high recently.    Obstructive Sleep Apnea    Uses his machine as he should. Greater than 70% of the time and greater than 4 days a week.    DIABETES October A1c 6.2%. Ran out of Jardiance  awhile back, cardiology sent in refill but has not been filled/is on hold. Tried to get a GLP1 in past but insurance would not cover, but has new insurance. Hypoglycemic episodes:no Polydipsia/polyuria: no Visual disturbance: no Chest pain: no Paresthesias: no Glucose Monitoring: yes  Accucheck frequency: not recently  Fasting glucose:   Post prandial:  Evening:  Before meals: Taking Insulin ?: no  Long acting insulin :  Short acting insulin : Blood Pressure Monitoring: occasionally Retinal Examination: Up to Date Foot Exam: Up to Date Pneumovax: Not up to Date - educated him on this today Influenza: Up to Date Aspirin : yes   HYPERTENSION / HYPERLIPIDEMIA/HF Continues Eliquis , Metoprolol , and Atorvastatin . Last EF via echo TEE was 30% and left ventricle has moderate to severely decreased function. Saw cardiology on 12/24/23, ordered echo. Continues to use CPAP nightly. Satisfied with current treatment? yes Duration of hypertension: chronic BP monitoring frequency: not checking BP range:  BP medication side effects: no Duration of hyperlipidemia: chronic Cholesterol medication side effects: yes Cholesterol supplements: none Medication  compliance: good compliance Aspirin : yes Recent stressors: no Recurrent headaches: no Visual changes: no Palpitations: no Dyspnea: no Chest pain: no Lower extremity edema: occasional, given Lasix  by cardiology to use PRN Dizzy/lightheaded: no  The 10-year ASCVD risk score (Arnett DK, et al., 2019) is: 15%   Values used to calculate the score:     Age: 39 years     Clincally relevant sex: Male     Is Non-Hispanic African American: No     Diabetic: Yes     Tobacco smoker: No     Systolic Blood Pressure: 118 mmHg     Is BP treated: No     HDL Cholesterol: 35 mg/dL     Total Cholesterol: 222 mg/dL  ATRIAL FIBRILLATION History of ablation in February 2025. Atrial fibrillation status: stable Satisfied with current treatment: yes  Medication side effects:  no Medication compliance: good compliance Etiology of atrial fibrillation: unknown Palpitations:  no Chest pain:  no Dyspnea on exertion:  no Orthopnea:  no Syncope:  no Edema:  no Ventricular rate control: B-blocker Anti-coagulation: long acting   Relevant past medical, surgical, family and social history reviewed and updated as indicated. Interim medical history since our last visit reviewed. Allergies and medications reviewed and updated.  Review of Systems  Constitutional:  Negative for activity change, diaphoresis, fatigue and fever.  Respiratory:  Negative for cough, chest tightness, shortness of breath and wheezing.   Cardiovascular:  Negative for chest pain, palpitations and leg swelling.  Gastrointestinal: Negative.   Endocrine: Negative for cold intolerance, heat intolerance, polydipsia, polyphagia and polyuria.  Neurological: Negative.   Psychiatric/Behavioral: Negative.     Per HPI unless specifically indicated above     Objective:    BP 118/82 (BP Location: Left Arm, Patient Position: Sitting, Cuff Size: Large)   Pulse (!) 54   Temp 97.9 F (36.6 C) (Oral)   Resp 14   Ht 5' 5.98 (1.676 m)   Wt (!)  320 lb 12.8 oz (145.5 kg)   SpO2 98%   BMI 51.80 kg/m   Wt Readings from Last 3 Encounters:  12/30/23 (!) 320 lb 12.8 oz (145.5 kg)  12/24/23 (!) 322 lb (146.1 kg)  04/23/23 (!) 323 lb (146.5 kg)    Physical Exam Vitals and nursing note reviewed.  Constitutional:      General: He is awake. He is not in acute distress.    Appearance: He is well-developed and well-groomed. He is obese. He is not ill-appearing or toxic-appearing.  HENT:     Head: Normocephalic.     Right Ear: Hearing and external ear normal.     Left Ear: Hearing and external ear normal.  Eyes:     General: Lids are normal.     Extraocular Movements: Extraocular movements intact.     Conjunctiva/sclera: Conjunctivae normal.  Neck:     Thyroid: No thyromegaly.     Vascular: No carotid bruit.  Cardiovascular:     Rate and Rhythm: Regular rhythm. Bradycardia present.     Heart sounds: Normal heart sounds. No murmur heard.    No gallop.  Pulmonary:     Effort: No accessory muscle usage or respiratory distress.     Breath sounds: Normal breath sounds.  Abdominal:     General: Bowel sounds are normal. There is no distension.     Palpations: Abdomen is soft.     Tenderness: There is no abdominal tenderness.  Musculoskeletal:     Cervical back: Full passive range of motion without pain.     Right lower leg: No edema.     Left lower leg: No edema.  Lymphadenopathy:     Cervical: No cervical adenopathy.  Skin:    General: Skin is warm.     Capillary Refill: Capillary refill takes less than 2 seconds.  Neurological:     Mental Status: He is alert and oriented to person, place, and time.     Deep Tendon Reflexes: Reflexes are normal and symmetric.     Reflex Scores:      Brachioradialis reflexes are 2+ on the right side and 2+ on the left side.      Patellar reflexes are 2+ on the right side and 2+ on the left side. Psychiatric:        Attention and Perception: Attention normal.        Mood and Affect: Mood  normal.        Speech: Speech normal.        Behavior: Behavior normal. Behavior is cooperative.        Thought Content: Thought content normal.    Diabetic Foot Exam - Simple   Simple Foot Form Visual Inspection No deformities, no ulcerations, no other skin breakdown bilaterally: Yes Sensation Testing Intact to touch and monofilament testing bilaterally: Yes Pulse Check Posterior Tibialis and Dorsalis pulse intact bilaterally: Yes Comments     Results for orders placed or performed during the hospital encounter  of 12/24/23  Basic Metabolic Panel (BMET)   Collection Time: 12/24/23  9:10 AM  Result Value Ref Range   Sodium 136 135 - 145 mmol/L   Potassium 4.5 3.5 - 5.1 mmol/L   Chloride 104 98 - 111 mmol/L   CO2 24 22 - 32 mmol/L   Glucose, Bld 107 (H) 70 - 99 mg/dL   BUN 15 6 - 20 mg/dL   Creatinine, Ser 9.18 0.61 - 1.24 mg/dL   Calcium  8.8 (L) 8.9 - 10.3 mg/dL   GFR, Estimated >39 >39 mL/min   Anion gap 9 5 - 15  CBC   Collection Time: 12/24/23  9:10 AM  Result Value Ref Range   WBC 7.3 4.0 - 10.5 K/uL   RBC 4.93 4.22 - 5.81 MIL/uL   Hemoglobin 15.0 13.0 - 17.0 g/dL   HCT 55.8 60.9 - 47.9 %   MCV 89.5 80.0 - 100.0 fL   MCH 30.4 26.0 - 34.0 pg   MCHC 34.0 30.0 - 36.0 g/dL   RDW 87.6 88.4 - 84.4 %   Platelets 242 150 - 400 K/uL   nRBC 0.0 0.0 - 0.2 %      Assessment & Plan:   Problem List Items Addressed This Visit       Cardiovascular and Mediastinum   Chronic HFrEF (heart failure with reduced ejection fraction) (HCC)   Ongoing at present.  Will continue collaboration with cardiology and current regimen as prescribed by them.  Recent notes reviewed.  Scheduled for repeat echo upcoming.  Recommend: - Reminded to call for an overnight weight gain of >2 pounds or a weekly weight gain of >5 pounds - not adding salt to food and read food labels. Reviewed the importance of keeping daily sodium intake to 2000mg  daily  - Avoid Ibuprofen products      Relevant  Medications   atorvastatin  (LIPITOR ) 80 MG tablet   metoprolol  succinate (TOPROL -XL) 25 MG 24 hr tablet   Cardiomyopathy (HCC)   Noted in hospital.  Continue to collaborate with cardiology and continue current medication regimen as ordered by them. Is scheduled for repeat echo upcoming.      Relevant Medications   atorvastatin  (LIPITOR ) 80 MG tablet   metoprolol  succinate (TOPROL -XL) 25 MG 24 hr tablet   Atrial fibrillation with RVR (HCC)   Diagnosed 11/19/22 with ablation in February 2025.  Regular rate today. Continue current medication regimen and collaboration with cardiology. Recent notes reviewed. Has been off Amiodarone  since his ablation.      Relevant Medications   atorvastatin  (LIPITOR ) 80 MG tablet   metoprolol  succinate (TOPROL -XL) 25 MG 24 hr tablet     Respiratory   OSA on CPAP   Chronic, stable.  Uses CPAP nightly.  Praised for this.        Endocrine   Type 2 diabetes mellitus with morbid obesity (HCC) - Primary   Chronic, stable with A1c 5.8% today.  Urine ALB 30 February 2025.  Continue Jardiance  only at this time, which is cost effective for him. Will resend and have staff check on why it was not filled recently when cardiology sent in.  Continue to check BS every other day and document for visits.  Focus on diabetic diet.   - Eye exam up to date - Foot exam up to date - PCV20 needed, discussed with patient. Refuses this and Flu vaccine. - No ACE or ARB -- will consider in future, but currently BP on lower side at baseline.  Educated him  on this. Jardiance  does offer kidney protection. - Statin on board.      Relevant Medications   empagliflozin  (JARDIANCE ) 10 MG TABS tablet   atorvastatin  (LIPITOR ) 80 MG tablet   Other Relevant Orders   Bayer DCA Hb A1c Waived   Hyperlipidemia associated with type 2 diabetes mellitus (HCC)   Chronic, ongoing.  Continue current medication regimen and adjust as needed.  Lipid panel today.      Relevant Medications    empagliflozin  (JARDIANCE ) 10 MG TABS tablet   atorvastatin  (LIPITOR ) 80 MG tablet   metoprolol  succinate (TOPROL -XL) 25 MG 24 hr tablet   Other Relevant Orders   Bayer DCA Hb A1c Waived   Lipid Panel w/o Chol/HDL Ratio   Diabetes mellitus treated with oral medication (HCC)   Refer to diabetes with obesity plan of care.      Relevant Medications   empagliflozin  (JARDIANCE ) 10 MG TABS tablet   atorvastatin  (LIPITOR ) 80 MG tablet   Other Relevant Orders   Bayer DCA Hb A1c Waived     Other   Morbid obesity (HCC)   BMI 51.80 with T2DM, OSA, A-FIB.  Recommended eating smaller high protein, low fat meals more frequently and exercising 30 mins a day 5 times a week with a goal of 10-15lb weight loss in the next 3 months. Patient voiced their understanding and motivation to adhere to these recommendations.       Relevant Medications   empagliflozin  (JARDIANCE ) 10 MG TABS tablet   Body mass index (BMI) of 50-59.9 in adult [facility])   Refer to morbid obesity plan of care.      Relevant Medications   empagliflozin  (JARDIANCE ) 10 MG TABS tablet     Follow up plan: Return in about 6 months (around 06/28/2024) for Annual Physical.

## 2023-12-30 NOTE — Assessment & Plan Note (Signed)
 Ongoing at present.  Will continue collaboration with cardiology and current regimen as prescribed by them.  Recent notes reviewed.  Scheduled for repeat echo upcoming.  Recommend: - Reminded to call for an overnight weight gain of >2 pounds or a weekly weight gain of >5 pounds - not adding salt to food and read food labels. Reviewed the importance of keeping daily sodium intake to 2000mg  daily  - Avoid Ibuprofen products

## 2023-12-30 NOTE — Assessment & Plan Note (Signed)
 Chronic, ongoing.  Continue current medication regimen and adjust as needed. Lipid panel today.

## 2023-12-30 NOTE — Assessment & Plan Note (Signed)
 BMI 51.80 with T2DM, OSA, A-FIB.  Recommended eating smaller high protein, low fat meals more frequently and exercising 30 mins a day 5 times a week with a goal of 10-15lb weight loss in the next 3 months. Patient voiced their understanding and motivation to adhere to these recommendations.

## 2023-12-30 NOTE — Assessment & Plan Note (Signed)
 Refer to diabetes with obesity plan of care.

## 2023-12-30 NOTE — Assessment & Plan Note (Addendum)
 Diagnosed 11/19/22 with ablation in February 2025.  Regular rate today. Continue current medication regimen and collaboration with cardiology. Recent notes reviewed. Has been off Amiodarone  since his ablation.

## 2023-12-30 NOTE — Assessment & Plan Note (Signed)
 Refer to morbid obesity plan of care.

## 2023-12-30 NOTE — Assessment & Plan Note (Signed)
 Noted in hospital.  Continue to collaborate with cardiology and continue current medication regimen as ordered by them. Is scheduled for repeat echo upcoming.

## 2023-12-31 ENCOUNTER — Ambulatory Visit: Payer: Self-pay | Admitting: Nurse Practitioner

## 2023-12-31 LAB — LIPID PANEL W/O CHOL/HDL RATIO
Cholesterol, Total: 121 mg/dL (ref 100–199)
HDL: 37 mg/dL — ABNORMAL LOW (ref >39)
LDL Chol Calc (NIH): 65 mg/dL (ref 0–99)
Triglycerides: 98 mg/dL (ref 0–149)
VLDL Cholesterol Cal: 19 mg/dL (ref 5–40)

## 2023-12-31 NOTE — Progress Notes (Signed)
 Contacted via MyChart  Good afternoon odd, your lipid panel has returned and looks much better than last check. Continue Atorvastatin  as ordered. Any questions? Keep being amazing!!  Thank you for allowing me to participate in your care.  I appreciate you. Kindest regards, Temple Sporer

## 2024-02-05 ENCOUNTER — Ambulatory Visit

## 2024-02-05 DIAGNOSIS — I5022 Chronic systolic (congestive) heart failure: Secondary | ICD-10-CM

## 2024-02-05 LAB — ECHOCARDIOGRAM COMPLETE
AR max vel: 2.71 cm2
AV Area VTI: 2.52 cm2
AV Area mean vel: 2.6 cm2
AV Mean grad: 3 mmHg
AV Peak grad: 5.5 mmHg
Ao pk vel: 1.17 m/s
Area-P 1/2: 3.37 cm2
S' Lateral: 3.4 cm

## 2024-02-05 MED ORDER — PERFLUTREN LIPID MICROSPHERE
1.0000 mL | INTRAVENOUS | Status: AC | PRN
  Administered 2024-02-05: 2 mL via INTRAVENOUS

## 2024-02-14 ENCOUNTER — Encounter: Payer: Self-pay | Admitting: Emergency Medicine

## 2024-02-15 ENCOUNTER — Other Ambulatory Visit: Payer: Self-pay | Admitting: Cardiology

## 2024-02-17 ENCOUNTER — Encounter: Payer: Self-pay | Admitting: Emergency Medicine

## 2024-02-18 ENCOUNTER — Other Ambulatory Visit: Payer: Self-pay | Admitting: Emergency Medicine

## 2024-02-18 MED ORDER — LOSARTAN POTASSIUM 25 MG PO TABS: 12.5000 mg | ORAL_TABLET | Freq: Every day | ORAL | 3 refills | Status: AC

## 2024-02-18 NOTE — Telephone Encounter (Signed)
 Last read by Lamar ONEIDA Joshua Krystal at 12:01PM on 02/18/2024.

## 2024-02-18 NOTE — Progress Notes (Signed)
 Prescription for Losartan  12.5 mg once daily sent to [facility] on [address] in [place].  MyChart message sent to patient.

## 2024-03-13 ENCOUNTER — Other Ambulatory Visit: Payer: Self-pay | Admitting: Nurse Practitioner

## 2024-03-30 ENCOUNTER — Ambulatory Visit: Admitting: Student

## 2024-06-30 ENCOUNTER — Encounter: Admitting: Nurse Practitioner
